# Patient Record
Sex: Female | Born: 1975
Health system: Southern US, Community
[De-identification: ages and names within clinical notes are randomized; demographics above are authoritative.]

## PROBLEM LIST (undated history)

## (undated) DIAGNOSIS — T7840XA Allergy, unspecified, initial encounter: Secondary | ICD-10-CM

## (undated) DIAGNOSIS — K219 Gastro-esophageal reflux disease without esophagitis: Secondary | ICD-10-CM

## (undated) DIAGNOSIS — Z8489 Family history of other specified conditions: Secondary | ICD-10-CM

## (undated) DIAGNOSIS — E119 Type 2 diabetes mellitus without complications: Secondary | ICD-10-CM

## (undated) DIAGNOSIS — K567 Ileus, unspecified: Secondary | ICD-10-CM

## (undated) DIAGNOSIS — I10 Essential (primary) hypertension: Secondary | ICD-10-CM

## (undated) DIAGNOSIS — Z800 Family history of malignant neoplasm of digestive organs: Secondary | ICD-10-CM

## (undated) DIAGNOSIS — G4730 Sleep apnea, unspecified: Secondary | ICD-10-CM

## (undated) DIAGNOSIS — D649 Anemia, unspecified: Secondary | ICD-10-CM

## (undated) DIAGNOSIS — J45909 Unspecified asthma, uncomplicated: Secondary | ICD-10-CM

## (undated) DIAGNOSIS — Z973 Presence of spectacles and contact lenses: Secondary | ICD-10-CM

## (undated) HISTORY — PX: ABDOMINAL HYSTERECTOMY: SHX81

## (undated) HISTORY — DX: Family history of malignant neoplasm of digestive organs: Z80.0

## (undated) HISTORY — DX: Sleep apnea, unspecified: G47.30

## (undated) HISTORY — DX: Ileus, unspecified: K56.7

## (undated) HISTORY — PX: TUBAL LIGATION: SHX77

## (undated) HISTORY — DX: Allergy, unspecified, initial encounter: T78.40XA

## (undated) HISTORY — DX: Anemia, unspecified: D64.9

---

## 2004-04-04 DIAGNOSIS — Q753 Macrocephaly: Secondary | ICD-10-CM

## 2013-09-02 ENCOUNTER — Emergency Department: Payer: Self-pay | Admitting: Emergency Medicine

## 2013-09-02 LAB — BASIC METABOLIC PANEL
Anion Gap: 4 — ABNORMAL LOW (ref 7–16)
BUN: 15 mg/dL (ref 7–18)
CHLORIDE: 108 mmol/L — AB (ref 98–107)
Calcium, Total: 8.6 mg/dL (ref 8.5–10.1)
Co2: 25 mmol/L (ref 21–32)
Creatinine: 0.79 mg/dL (ref 0.60–1.30)
EGFR (African American): >60
EGFR (Non-African Amer.): >60
Glucose: 104 mg/dL — ABNORMAL HIGH (ref 65–99)
OSMOLALITY: 275 (ref 275–301)
POTASSIUM: 4 mmol/L (ref 3.5–5.1)
Sodium: 137 mmol/L (ref 136–145)

## 2013-09-02 LAB — TROPONIN I

## 2013-09-02 LAB — CBC
HCT: 28.1 % — AB (ref 35.0–47.0)
HGB: 8.2 g/dL — ABNORMAL LOW (ref 12.0–16.0)
MCH: 18.4 pg — AB (ref 26.0–34.0)
MCHC: 29.1 g/dL — ABNORMAL LOW (ref 32.0–36.0)
MCV: 63 fL — AB (ref 80–100)
Platelet: 401 10*3/uL (ref 150–440)
RBC: 4.43 10*6/uL (ref 3.80–5.20)
RDW: 17.9 % — AB (ref 11.5–14.5)
WBC: 7.4 10*3/uL (ref 3.6–11.0)

## 2013-10-30 ENCOUNTER — Emergency Department: Payer: Self-pay | Admitting: Internal Medicine

## 2013-10-30 LAB — CBC
HCT: 30.7 % — AB (ref 35.0–47.0)
HGB: 9.3 g/dL — ABNORMAL LOW (ref 12.0–16.0)
MCH: 19.7 pg — ABNORMAL LOW (ref 26.0–34.0)
MCHC: 30.4 g/dL — ABNORMAL LOW (ref 32.0–36.0)
MCV: 65 fL — ABNORMAL LOW (ref 80–100)
Platelet: 461 10*3/uL — ABNORMAL HIGH (ref 150–440)
RBC: 4.73 10*6/uL (ref 3.80–5.20)
RDW: 20.6 % — AB (ref 11.5–14.5)
WBC: 4.4 10*3/uL (ref 3.6–11.0)

## 2013-10-30 LAB — COMPREHENSIVE METABOLIC PANEL
ALBUMIN: 3.4 g/dL (ref 3.4–5.0)
ALT: 18 U/L
AST: 21 U/L (ref 15–37)
Alkaline Phosphatase: 52 U/L
Anion Gap: 5 — ABNORMAL LOW (ref 7–16)
BILIRUBIN TOTAL: 0.2 mg/dL (ref 0.2–1.0)
BUN: 10 mg/dL (ref 7–18)
CO2: 27 mmol/L (ref 21–32)
CREATININE: 0.67 mg/dL (ref 0.60–1.30)
Calcium, Total: 8.1 mg/dL — ABNORMAL LOW (ref 8.5–10.1)
Chloride: 106 mmol/L (ref 98–107)
EGFR (African American): >60
EGFR (Non-African Amer.): >60
Glucose: 85 mg/dL (ref 65–99)
Osmolality: 274 (ref 275–301)
Potassium: 3.9 mmol/L (ref 3.5–5.1)
SODIUM: 138 mmol/L (ref 136–145)
TOTAL PROTEIN: 7.6 g/dL (ref 6.4–8.2)

## 2013-10-30 LAB — TROPONIN I: Troponin-I: <0.02 ng/mL

## 2013-10-30 LAB — LIPASE, BLOOD: LIPASE: 244 U/L (ref 73–393)

## 2013-10-30 LAB — CK TOTAL AND CKMB (NOT AT ARMC)
CK, Total: 129 U/L
CK-MB: 0.9 ng/mL (ref 0.5–3.6)

## 2013-10-30 LAB — D-DIMER(ARMC): D-Dimer: 396 ng/ml

## 2013-12-26 ENCOUNTER — Ambulatory Visit: Payer: Self-pay | Admitting: General Practice

## 2014-03-06 ENCOUNTER — Ambulatory Visit: Payer: Self-pay | Admitting: General Practice

## 2014-05-06 ENCOUNTER — Emergency Department: Payer: Self-pay | Admitting: Emergency Medicine

## 2014-05-06 LAB — CBC WITH DIFFERENTIAL/PLATELET
BASOS ABS: 0 10*3/uL (ref 0.0–0.1)
Basophil %: 0.8 %
EOS PCT: 3.8 %
Eosinophil #: 0.2 10*3/uL (ref 0.0–0.7)
HCT: 30.5 % — ABNORMAL LOW (ref 35.0–47.0)
HGB: 9.2 g/dL — ABNORMAL LOW (ref 12.0–16.0)
LYMPHS ABS: 1.9 10*3/uL (ref 1.0–3.6)
Lymphocyte %: 38.4 %
MCH: 20.4 pg — AB (ref 26.0–34.0)
MCHC: 30.1 g/dL — ABNORMAL LOW (ref 32.0–36.0)
MCV: 68 fL — ABNORMAL LOW (ref 80–100)
Monocyte #: 0.6 x10 3/mm (ref 0.2–0.9)
Monocyte %: 11.1 %
Neutrophil #: 2.3 10*3/uL (ref 1.4–6.5)
Neutrophil %: 45.9 %
Platelet: 455 10*3/uL — ABNORMAL HIGH (ref 150–440)
RBC: 4.51 10*6/uL (ref 3.80–5.20)
RDW: 18.9 % — ABNORMAL HIGH (ref 11.5–14.5)
WBC: 5 10*3/uL (ref 3.6–11.0)

## 2014-05-06 LAB — COMPREHENSIVE METABOLIC PANEL
Albumin: 3.2 g/dL — ABNORMAL LOW (ref 3.4–5.0)
Alkaline Phosphatase: 60 U/L (ref 46–116)
Anion Gap: 9 (ref 7–16)
BILIRUBIN TOTAL: 0.2 mg/dL (ref 0.2–1.0)
BUN: 13 mg/dL (ref 7–18)
CO2: 25 mmol/L (ref 21–32)
CREATININE: 0.78 mg/dL (ref 0.60–1.30)
Calcium, Total: 8.8 mg/dL (ref 8.5–10.1)
Chloride: 106 mmol/L (ref 98–107)
EGFR (African American): >60
Glucose: 107 mg/dL — ABNORMAL HIGH (ref 65–99)
OSMOLALITY: 280 (ref 275–301)
Potassium: 4 mmol/L (ref 3.5–5.1)
SGOT(AST): 13 U/L — ABNORMAL LOW (ref 15–37)
SGPT (ALT): 19 U/L (ref 14–63)
SODIUM: 140 mmol/L (ref 136–145)
Total Protein: 7.1 g/dL (ref 6.4–8.2)

## 2014-05-06 LAB — URINALYSIS, COMPLETE
Bacteria: NONE SEEN
Bilirubin,UR: NEGATIVE
Blood: NEGATIVE
Glucose,UR: NEGATIVE mg/dL (ref 0–75)
Ketone: NEGATIVE
LEUKOCYTE ESTERASE: NEGATIVE
NITRITE: NEGATIVE
Ph: 6 (ref 4.5–8.0)
Protein: NEGATIVE
RBC, UR: NONE SEEN /HPF (ref 0–5)
Specific Gravity: 1.016 (ref 1.003–1.030)

## 2014-05-06 LAB — GC/CHLAMYDIA PROBE AMP

## 2014-05-06 LAB — WET PREP, GENITAL

## 2014-05-06 LAB — LIPASE, BLOOD: Lipase: 263 U/L (ref 73–393)

## 2015-02-08 ENCOUNTER — Emergency Department: Payer: BLUE CROSS/BLUE SHIELD

## 2015-02-08 ENCOUNTER — Emergency Department
Admission: EM | Admit: 2015-02-08 | Discharge: 2015-02-08 | Disposition: A | Discharge status: Home / Self Care | Payer: BLUE CROSS/BLUE SHIELD | Attending: Emergency Medicine | Admitting: Emergency Medicine

## 2015-02-08 ENCOUNTER — Encounter: Payer: Self-pay | Admitting: Emergency Medicine

## 2015-02-08 DIAGNOSIS — I10 Essential (primary) hypertension: Secondary | ICD-10-CM | POA: Diagnosis not present

## 2015-02-08 DIAGNOSIS — R002 Palpitations: Secondary | ICD-10-CM | POA: Diagnosis not present

## 2015-02-08 DIAGNOSIS — E119 Type 2 diabetes mellitus without complications: Secondary | ICD-10-CM | POA: Insufficient documentation

## 2015-02-08 DIAGNOSIS — R079 Chest pain, unspecified: Secondary | ICD-10-CM | POA: Diagnosis present

## 2015-02-08 HISTORY — DX: Type 2 diabetes mellitus without complications: E11.9

## 2015-02-08 HISTORY — DX: Essential (primary) hypertension: I10

## 2015-02-08 HISTORY — DX: Unspecified asthma, uncomplicated: J45.909

## 2015-02-08 LAB — CBC
HEMATOCRIT: 27.7 % — AB (ref 35.0–47.0)
HEMOGLOBIN: 8.4 g/dL — AB (ref 12.0–16.0)
MCH: 19.5 pg — ABNORMAL LOW (ref 26.0–34.0)
MCHC: 30.5 g/dL — ABNORMAL LOW (ref 32.0–36.0)
MCV: 63.9 fL — AB (ref 80.0–100.0)
Platelets: 456 10*3/uL — ABNORMAL HIGH (ref 150–440)
RBC: 4.33 MIL/uL (ref 3.80–5.20)
RDW: 17.3 % — AB (ref 11.5–14.5)
WBC: 4.4 10*3/uL (ref 3.6–11.0)

## 2015-02-08 LAB — BASIC METABOLIC PANEL
ANION GAP: 4 — AB (ref 5–15)
BUN: 12 mg/dL (ref 6–20)
CALCIUM: 8.8 mg/dL — AB (ref 8.9–10.3)
CO2: 26 mmol/L (ref 22–32)
Chloride: 105 mmol/L (ref 101–111)
Creatinine, Ser: 0.66 mg/dL (ref 0.44–1.00)
GFR calc non Af Amer: >60 mL/min (ref >60)
GLUCOSE: 98 mg/dL (ref 65–99)
POTASSIUM: 4.5 mmol/L (ref 3.5–5.1)
Sodium: 135 mmol/L (ref 135–145)

## 2015-02-08 LAB — TROPONIN I

## 2015-02-08 MED ORDER — ASPIRIN 81 MG PO CHEW
324.0000 mg | CHEWABLE_TABLET | Freq: Once | ORAL | Status: AC
  Administered 2015-02-08: 324 mg via ORAL
  Filled 2015-02-08: qty 4

## 2015-02-08 NOTE — ED Notes (Signed)
States she developed chest pain on Thursday  States pain eases off with lying down  Also having some SOB and feels like she is having some palpations

## 2015-02-08 NOTE — Discharge Instructions (Signed)

## 2015-02-08 NOTE — ED Provider Notes (Signed)
[facility] Emergency Department Provider Note REMINDER - THIS NOTE IS NOT A FINAL MEDICAL RECORD UNTIL IT IS SIGNED. UNTIL THEN, THE CONTENT BELOW MAY REFLECT INFORMATION FROM A DOCUMENTATION TEMPLATE, NOT THE ACTUAL PATIENT VISIT. ____________________________________________  Time seen: Approximately 7:35 AM  I have reviewed the triage vital signs and the nursing notes.   HISTORY  Chief Complaint Chest Pain    HPI Donna Reyes is a 39 y.o. female with no significant medical history except for diabetes, asthma, and hypertension  She notes since about Thursday she's had episodes of palpitations that will last for approximately 45 seconds where she feels slightly breathless. 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  She denies any chest pain. She feels briefly breathless during episodes of palpitations, but presently does not have any. She is not having sharp pains with deep inspiration. She denies any wheezing.  She otherwise feels well, and at present time is asymptomatic.   Past Medical History  Diagnosis Date  . Asthma   . Diabetes mellitus without complication ([place])   . Hypertension     There are no active problems to display for this patient.   No past surgical history on file.  No current outpatient prescriptions on file.  Allergies Review of patient's allergies indicates no known allergies.  No family history on file.  Social History Social History  Substance Use Topics  . Smoking status: Never Smoker   . Smokeless tobacco: None  . Alcohol Use: No    Review of Systems Constitutional: No fever/chills Eyes: No visual changes. ENT: No sore throat. Cardiovascular: Denies chest pain. Respiratory: Denies shortness of breath except feels slightly short of  breath during episodes of palpitations. Gastrointestinal: No abdominal pain.  No nausea, no vomiting.  No diarrhea.  No constipation. Genitourinary: Negative for dysuria. Musculoskeletal: Negative for back pain. Skin: Negative for rash. Neurological: Negative for headaches, focal weakness or numbness.  Patient reports that when she was pregnant about 15 years ago she did have swelling in her legs, but has never had a history of any blood clots.  10-point ROS otherwise negative.  ____________________________________________   PHYSICAL EXAM:  VITAL SIGNS: ED Triage Vitals  Enc Vitals Group     BP --      Pulse --      Resp --      Temp --      Temp src --      SpO2 --      Weight 02/08/15 0721 184 lb (83.462 kg)     Height 02/08/15 0721 5\' 2"  (1.575 m)     Head Cir --      Peak Flow --      Pain Score 02/08/15 0721 3     Pain Loc --      Pain Edu? --      Excl. in [place]? --    Constitutional: Alert and oriented. Well appearing and in no acute distress. Eyes: Conjunctivae are normal. PERRL. EOMI. Head: Atraumatic. Nose: No congestion/rhinnorhea. Mouth/Throat: Mucous membranes are moist.  Oropharynx non-erythematous. Neck: No stridor.   Cardiovascular: Normal rate, regular rhythm. Grossly normal heart sounds.  Good peripheral circulation. Respiratory: Normal respiratory effort.  No retractions. Lungs CTAB. Gastrointestinal: Soft and nontender. No distention. No abdominal bruits. No CVA tenderness.  Musculoskeletal: No lower extremity tenderness nor edema.  No unilateral leg swelling. No thigh tenderness or cords. No joint effusions. Neurologic:  Normal speech and language. No gross focal neurologic deficits are appreciated. No gait instability. Skin:  Skin is warm, dry and intact. No rash noted. Psychiatric: Mood and affect are normal. Speech and behavior are normal.  ____________________________________________   LABS (all labs ordered are listed, but only abnormal results  are displayed)  Labs Reviewed  CBC - Abnormal; Notable for the following:    Hemoglobin 8.4 (*)    HCT 27.7 (*)    MCV 63.9 (*)    MCH 19.5 (*)    MCHC 30.5 (*)    RDW 17.3 (*)    Platelets 456 (*)    All other components within normal limits  BASIC METABOLIC PANEL - Abnormal; Notable for the following:    Calcium 8.8 (*)    Anion gap 4 (*)    All other components within normal limits  TROPONIN I   ____________________________________________  EKG  ED ECG REPORT I, Reynald Woods, the attending physician, personally viewed and interpreted this ECG.  Date: 02/08/2015 EKG Time: 723am Rate: 75 Rhythm: normal sinus rhythm QRS Axis: normal Intervals: normal ST/T Wave abnormalities: normal Conduction Disutrbances: none Narrative Interpretation: unremarkable  ____________________________________________  RADIOLOGY  DG Chest 2 View (Final result) Result time: 02/08/15 08:15:38   Final result by Rad Results In Interface (02/08/15 08:15:38)   Narrative:   CLINICAL DATA: Chest pain. History of asthma, hypertension and diabetes.  EXAM: CHEST 2 VIEW  COMPARISON: 09/02/2013; chest CTA 10/30/2013  FINDINGS: Grossly unchanged cardiac silhouette and mediastinal contours. The lungs remain hyperexpanded. No focal airspace opacities. No pleural effusion or pneumothorax. No evidence of edema. No acute osseus abnormalities.  IMPRESSION: Similar findings of lung hyper expansion without acute cardiopulmonary disease.    ____________________________________________   PROCEDURES  Procedure(s) performed: None  Critical Care performed: No  ____________________________________________   INITIAL IMPRESSION / ASSESSMENT AND PLAN / ED COURSE  Pertinent labs & imaging results that were available during my care of the patient were reviewed by me and considered in my medical decision making (see chart for details).      Pulmonary Embolism Rule-out Criteria (PERC  rule)                        If YES to ANY of the following, the PERC rule is not satisfied and cannot be used to rule out PE in this patient (consider d-dimer or imaging depending on pre-test probability).                      If NO to ALL of the following, AND the clinician's pre-test probability is <15%, the [facility] rule is satisfied and there is no need for further workup (including no need to obtain a d-dimer) as the post-test probability of pulmonary embolism is <2%.                      Mnemonic is HAD CLOTS   H - hormone use (exogenous estrogen)      No. A - age > 27                                                 No. D - DVT/PE  history                                      No.   C - coughing blood (hemoptysis)                 No. L - leg swelling, unilateral                             No. O - O2 Sat on Room Air < 95%                  No. T - tachycardia (HR ? 100)                         No. S - surgery or trauma, recent                      No.   Based on my evaluation of the patient, including application of this decision instrument, further testing to evaluate for pulmonary embolism is not indicated at this time. I have discussed this recommendation with the patient who states understanding and agreement with this plan.  Patient presents for evaluation of palpitations. She is awake and alert in no distress. Her EKG is normal, she has no evidence of distress, wheezing, or significant risk factors for pulmonary and wasn't. Her symptoms seem to be primarily around episode of palpitations, and she does reports she's had this previously with a workup by cardiology Dr. Chancy Milroy did not demonstrate a clear cause about 1-2 years ago. At the present time, she has no concerning symptoms such as syncope or extreme weakness. We will obtain a single troponin and she is a diabetic female, and do wish to exclude ischemia however my primary thought is that this is likely palpitations. She has no acute  respiratory symptoms aside from some mild dyspnea occurs during episodes of palpitations or hospital 45 seconds. No infectious symptoms, denies any symptoms of "asthma".  I discussed with the patient that if her testing in the ER does not show a concerning cause that I would advise her to follow up with Dr. Chancy Milroy via phone call and setting up an appointment tomorrow. She is very agreeable, and we did discuss careful return precautions including if she develops "chest pain", extreme weakness, palpitations do not go away, nausea, vomiting, or other new concerns arise.  ----------------------------------------- 9:01 AM on 02/08/2015 ----------------------------------------- Labs to indicate anemia, appears to have microcytic anemia with the previous history of baseline hemoglobin approximately 9. Patient is aware of this, and has been under physician care regarding ongoing anemia.  No episodes of ectopy on telemetry. Patient awake alert no distress. We'll discharge patient home, following up with Dr. Chancy Milroy of cardiology. Return precautions advised. ____________________________________________   FINAL CLINICAL IMPRESSION(S) / ED DIAGNOSES  Final diagnoses:  Heart palpitations      Delman Kitten, MD 02/08/15 [phone]

## 2015-08-08 ENCOUNTER — Emergency Department
Admission: EM | Admit: 2015-08-08 | Discharge: 2015-08-08 | Disposition: A | Discharge status: Home / Self Care | Payer: BLUE CROSS/BLUE SHIELD | Attending: Emergency Medicine | Admitting: Emergency Medicine

## 2015-08-08 ENCOUNTER — Encounter: Payer: Self-pay | Admitting: Emergency Medicine

## 2015-08-08 DIAGNOSIS — R1031 Right lower quadrant pain: Secondary | ICD-10-CM | POA: Insufficient documentation

## 2015-08-08 DIAGNOSIS — Z5321 Procedure and treatment not carried out due to patient leaving prior to being seen by health care provider: Secondary | ICD-10-CM | POA: Diagnosis not present

## 2015-08-08 LAB — CBC
HEMATOCRIT: 26.5 % — AB (ref 35.0–47.0)
HEMOGLOBIN: 7.9 g/dL — AB (ref 12.0–16.0)
MCH: 17.9 pg — AB (ref 26.0–34.0)
MCHC: 29.9 g/dL — AB (ref 32.0–36.0)
MCV: 59.7 fL — AB (ref 80.0–100.0)
Platelets: 494 10*3/uL — ABNORMAL HIGH (ref 150–440)
RBC: 4.45 MIL/uL (ref 3.80–5.20)
RDW: 18.3 % — AB (ref 11.5–14.5)
WBC: 6 10*3/uL (ref 3.6–11.0)

## 2015-08-08 LAB — COMPREHENSIVE METABOLIC PANEL
ALBUMIN: 3.9 g/dL (ref 3.5–5.0)
ALT: 10 U/L — ABNORMAL LOW (ref 14–54)
ANION GAP: 7 (ref 5–15)
AST: 14 U/L — AB (ref 15–41)
Alkaline Phosphatase: 49 U/L (ref 38–126)
BILIRUBIN TOTAL: 0.3 mg/dL (ref 0.3–1.2)
BUN: 14 mg/dL (ref 6–20)
CHLORIDE: 106 mmol/L (ref 101–111)
CO2: 23 mmol/L (ref 22–32)
Calcium: 8.9 mg/dL (ref 8.9–10.3)
Creatinine, Ser: 0.69 mg/dL (ref 0.44–1.00)
GFR calc Af Amer: >60 mL/min (ref >60)
GFR calc non Af Amer: >60 mL/min (ref >60)
GLUCOSE: 118 mg/dL — AB (ref 65–99)
POTASSIUM: 3.9 mmol/L (ref 3.5–5.1)
SODIUM: 136 mmol/L (ref 135–145)
TOTAL PROTEIN: 7.3 g/dL (ref 6.5–8.1)

## 2015-08-08 LAB — URINALYSIS COMPLETE WITH MICROSCOPIC (ARMC ONLY)
BACTERIA UA: NONE SEEN
Bilirubin Urine: NEGATIVE
Glucose, UA: NEGATIVE mg/dL
Hgb urine dipstick: NEGATIVE
KETONES UR: NEGATIVE mg/dL
Leukocytes, UA: NEGATIVE
Nitrite: NEGATIVE
PH: 8 (ref 5.0–8.0)
PROTEIN: NEGATIVE mg/dL
Specific Gravity, Urine: 1.014 (ref 1.005–1.030)

## 2015-08-08 LAB — LIPASE, BLOOD: Lipase: 48 U/L (ref 11–51)

## 2015-08-08 LAB — POCT PREGNANCY, URINE: PREG TEST UR: NEGATIVE

## 2015-08-08 NOTE — ED Notes (Signed)
Patient states that she has had right lower abd pain times four days. Patient reports that the pain became worse and woke her out of her sleep this morning. Patient repots nausea but no vomiting.

## 2015-08-10 ENCOUNTER — Telehealth: Payer: Self-pay | Admitting: Emergency Medicine

## 2015-08-10 NOTE — ED Notes (Signed)
Called patient due to lwot to inquire about condition and follow up plans. Pt says she is feeling better.  She says she does  Have a pcp.  i advised her to have pcp review the labs we did here.  She agrees.

## 2015-09-16 DIAGNOSIS — G4733 Obstructive sleep apnea (adult) (pediatric): Secondary | ICD-10-CM | POA: Diagnosis not present

## 2015-11-13 DIAGNOSIS — H5213 Myopia, bilateral: Secondary | ICD-10-CM | POA: Diagnosis not present

## 2015-11-13 DIAGNOSIS — Z0100 Encounter for examination of eyes and vision without abnormal findings: Secondary | ICD-10-CM | POA: Diagnosis not present

## 2015-12-17 DIAGNOSIS — G4733 Obstructive sleep apnea (adult) (pediatric): Secondary | ICD-10-CM | POA: Diagnosis not present

## 2015-12-30 DIAGNOSIS — D509 Iron deficiency anemia, unspecified: Secondary | ICD-10-CM | POA: Insufficient documentation

## 2015-12-30 NOTE — Progress Notes (Signed)
[facility]  Telephone:([phone] [phone]  ID: Donna Reyes OB: 18-Apr-1975  MR#: [id number]  [id number]  No care team member to display  CHIEF COMPLAINT: Iron deficiency anemia  INTERVAL HISTORY: Patient is a 40 year old female who was found to have significant iron deficiency anemia on routine blood work. She currently feels weak and fatigued, but otherwise feels well. She reports heavy menses, but no other bleeding. She has no neurologic complaints. She denies any recent fevers or illnesses. She has a good appetite and denies weight loss. She denies any chest pain or shortness of breath. She denies any nausea, vomiting, constipation, or diarrhea. She has no melena or hematochezia. She has no urinary complaints. Patient otherwise feels well and offers no further specific complaints.   REVIEW OF SYSTEMS:   Review of Systems  Constitutional: Positive for malaise/fatigue. Negative for fever and weight loss.  Respiratory: Negative.  Negative for cough and shortness of breath.   Cardiovascular: Negative.  Negative for chest pain and leg swelling.  Gastrointestinal: Negative.  Negative for abdominal pain, blood in stool and melena.  Genitourinary: Negative.   Musculoskeletal: Negative.   Neurological: Positive for weakness.  Psychiatric/Behavioral: Negative.  The patient is not nervous/anxious.     As per HPI. Otherwise, a complete review of systems is negative.  PAST MEDICAL HISTORY: Past Medical History:  Diagnosis Date  . Allergy   . Asthma   . Diabetes mellitus without complication ([place])   . Hypertension     PAST SURGICAL HISTORY: Past Surgical History:  Procedure Laterality Date  . CESAREAN SECTION     times 3    FAMILY HISTORY: Reviewed and unchanged. No reported history of malignancy or chronic disease.  ADVANCED DIRECTIVES (Y/N):  N  HEALTH MAINTENANCE: Social History  Substance Use Topics  . Smoking status: Never  Smoker  . Smokeless tobacco: Never Used  . Alcohol use No     Colonoscopy:  PAP:  Bone density:  Lipid panel:  Allergies  Allergen Reactions  . Lisinopril     Current Outpatient Prescriptions  Medication Sig Dispense Refill  . amLODipine (NORVASC) 5 MG tablet 5 mg.  10   No current facility-administered medications for this visit.     OBJECTIVE: Vitals:   01/01/16 0845  BP: 121/82  Pulse: 74  Temp: 97.2 F (36.2 C)     Body mass index is 33.23 kg/m.    ECOG FS:0 - Asymptomatic  General: Well-developed, well-nourished, no acute distress. Eyes: Pink conjunctiva, anicteric sclera. HEENT: Normocephalic, moist mucous membranes, clear oropharnyx. Lungs: Clear to auscultation bilaterally. Heart: Regular rate and rhythm. No rubs, murmurs, or gallops. Abdomen: Soft, nontender, nondistended. No organomegaly noted, normoactive bowel sounds. Musculoskeletal: No edema, cyanosis, or clubbing. Neuro: Alert, answering all questions appropriately. Cranial nerves grossly intact. Skin: No rashes or petechiae noted. Psych: Normal affect. Lymphatics: No cervical, calvicular, axillary or inguinal LAD.   LAB RESULTS:  Lab Results  Component Value Date   NA 136 08/08/2015   K 3.9 08/08/2015   CL 106 08/08/2015   CO2 23 08/08/2015   GLUCOSE 118 (H) 08/08/2015   BUN 14 08/08/2015   CREATININE 0.69 08/08/2015   CALCIUM 8.9 08/08/2015   PROT 7.3 08/08/2015   ALBUMIN 3.9 08/08/2015   AST 14 (L) 08/08/2015   ALT 10 (L) 08/08/2015   ALKPHOS 49 08/08/2015   BILITOT 0.3 08/08/2015   GFRNONAA >60 08/08/2015   GFRAA >60 08/08/2015    Lab Results  Component Value Date  WBC 4.4 01/01/2016   NEUTROABS 2.3 05/06/2014   HGB 8.0 (L) 01/01/2016   HCT 27.4 (L) 01/01/2016   MCV 60.3 (L) 01/01/2016   PLT 391 01/01/2016   Lab Results  Component Value Date   IRON 11 (L) 01/01/2016   TIBC 499 (H) 01/01/2016   IRONPCTSAT 2 (L) 01/01/2016   Lab Results  Component Value Date    FERRITIN 4 (L) 01/01/2016     STUDIES: No results found.  ASSESSMENT: Iron deficiency anemia  PLAN:    1. Iron deficiency anemia:Likely secondary to patient's heavy menses. Patient reports she received IV iron in [place] several years ago, but it "did not work". She also has tried oral iron supplementation, but this gives her an upset stomach. Patient's hemoglobin and iron stores are significantly decreased and she will benefit from IV iron. The remainder of her laboratory work today including hemoglobin electrophoresis is pending at time of dictation. Return to clinic in 1 and 2 weeks to receive 510 mg IV Feraheme. Patient will then return to clinic in 2 months for repeat laboratory work and further evaluation. 2. Thrombocytosis: Patient's platelet count is within normal limits today.   Patient expressed understanding and was in agreement with this plan. She also understands that She can call clinic at any time with any questions, concerns, or complaints.    Lloyd Huger, MD   01/01/2016 1:49 PM

## 2016-01-01 ENCOUNTER — Encounter: Payer: Self-pay | Admitting: Oncology

## 2016-01-01 ENCOUNTER — Inpatient Hospital Stay: Payer: BLUE CROSS/BLUE SHIELD | Attending: Oncology | Admitting: Oncology

## 2016-01-01 ENCOUNTER — Inpatient Hospital Stay: Payer: BLUE CROSS/BLUE SHIELD

## 2016-01-01 VITALS — BP 121/82 | HR 74 | Temp 97.2°F | Ht 62.0 in | Wt 181.7 lb

## 2016-01-01 DIAGNOSIS — I10 Essential (primary) hypertension: Secondary | ICD-10-CM | POA: Insufficient documentation

## 2016-01-01 DIAGNOSIS — J45909 Unspecified asthma, uncomplicated: Secondary | ICD-10-CM | POA: Insufficient documentation

## 2016-01-01 DIAGNOSIS — D509 Iron deficiency anemia, unspecified: Secondary | ICD-10-CM | POA: Diagnosis not present

## 2016-01-01 DIAGNOSIS — N920 Excessive and frequent menstruation with regular cycle: Secondary | ICD-10-CM | POA: Diagnosis not present

## 2016-01-01 DIAGNOSIS — E119 Type 2 diabetes mellitus without complications: Secondary | ICD-10-CM | POA: Insufficient documentation

## 2016-01-01 DIAGNOSIS — R5383 Other fatigue: Secondary | ICD-10-CM | POA: Diagnosis not present

## 2016-01-01 DIAGNOSIS — R531 Weakness: Secondary | ICD-10-CM | POA: Insufficient documentation

## 2016-01-01 LAB — FERRITIN: Ferritin: 4 ng/mL — ABNORMAL LOW (ref 11–307)

## 2016-01-01 LAB — LACTATE DEHYDROGENASE: LDH: 155 U/L (ref 98–192)

## 2016-01-01 LAB — CBC
HEMATOCRIT: 27.4 % — AB (ref 35.0–47.0)
Hemoglobin: 8 g/dL — ABNORMAL LOW (ref 12.0–16.0)
MCH: 17.6 pg — ABNORMAL LOW (ref 26.0–34.0)
MCHC: 29.2 g/dL — ABNORMAL LOW (ref 32.0–36.0)
MCV: 60.3 fL — ABNORMAL LOW (ref 80.0–100.0)
PLATELETS: 391 10*3/uL (ref 150–440)
RBC: 4.54 MIL/uL (ref 3.80–5.20)
RDW: 18.7 % — ABNORMAL HIGH (ref 11.5–14.5)
WBC: 4.4 10*3/uL (ref 3.6–11.0)

## 2016-01-01 LAB — DAT, POLYSPECIFIC AHG (ARMC ONLY): Polyspecific AHG test: NEGATIVE

## 2016-01-01 LAB — IRON AND TIBC
Iron: 11 ug/dL — ABNORMAL LOW (ref 28–170)
SATURATION RATIOS: 2 % — AB (ref 10.4–31.8)
TIBC: 499 ug/dL — AB (ref 250–450)
UIBC: 488 ug/dL

## 2016-01-01 LAB — VITAMIN B12: VITAMIN B 12: 695 pg/mL (ref 180–914)

## 2016-01-01 LAB — FOLATE: Folate: 17.7 ng/mL (ref >5.9)

## 2016-01-02 LAB — ANA W/REFLEX: Anti Nuclear Antibody(ANA): NEGATIVE

## 2016-01-02 LAB — HAPTOGLOBIN: Haptoglobin: 69 mg/dL (ref 34–200)

## 2016-01-04 LAB — HEMOGLOBINOPATHY EVALUATION
HGB F QUANT: 0 % (ref 0.0–2.0)
HGB S QUANTITAION: 0 %
Hgb A2 Quant: 1.5 % (ref 0.7–3.1)
Hgb A: 98.5 % — ABNORMAL HIGH (ref 94.0–98.0)
Hgb C: 0 %

## 2016-01-05 DIAGNOSIS — R079 Chest pain, unspecified: Secondary | ICD-10-CM | POA: Insufficient documentation

## 2016-01-05 DIAGNOSIS — J45909 Unspecified asthma, uncomplicated: Secondary | ICD-10-CM | POA: Insufficient documentation

## 2016-01-05 DIAGNOSIS — I10 Essential (primary) hypertension: Secondary | ICD-10-CM | POA: Diagnosis not present

## 2016-01-05 DIAGNOSIS — R0602 Shortness of breath: Secondary | ICD-10-CM | POA: Diagnosis not present

## 2016-01-05 DIAGNOSIS — Z79899 Other long term (current) drug therapy: Secondary | ICD-10-CM | POA: Diagnosis not present

## 2016-01-05 DIAGNOSIS — E119 Type 2 diabetes mellitus without complications: Secondary | ICD-10-CM | POA: Diagnosis not present

## 2016-01-06 ENCOUNTER — Emergency Department
Admission: EM | Admit: 2016-01-06 | Discharge: 2016-01-06 | Disposition: A | Discharge status: Home / Self Care | Payer: BLUE CROSS/BLUE SHIELD | Attending: Emergency Medicine | Admitting: Emergency Medicine

## 2016-01-06 ENCOUNTER — Other Ambulatory Visit: Payer: Self-pay

## 2016-01-06 ENCOUNTER — Encounter: Payer: Self-pay | Admitting: Emergency Medicine

## 2016-01-06 ENCOUNTER — Emergency Department: Payer: BLUE CROSS/BLUE SHIELD

## 2016-01-06 DIAGNOSIS — J4521 Mild intermittent asthma with (acute) exacerbation: Secondary | ICD-10-CM | POA: Insufficient documentation

## 2016-01-06 DIAGNOSIS — J45909 Unspecified asthma, uncomplicated: Secondary | ICD-10-CM | POA: Insufficient documentation

## 2016-01-06 DIAGNOSIS — R079 Chest pain, unspecified: Secondary | ICD-10-CM | POA: Insufficient documentation

## 2016-01-06 DIAGNOSIS — I208 Other forms of angina pectoris: Secondary | ICD-10-CM | POA: Diagnosis not present

## 2016-01-06 DIAGNOSIS — I10 Essential (primary) hypertension: Secondary | ICD-10-CM | POA: Diagnosis not present

## 2016-01-06 DIAGNOSIS — R0602 Shortness of breath: Secondary | ICD-10-CM | POA: Diagnosis not present

## 2016-01-06 LAB — BASIC METABOLIC PANEL
Anion gap: 5 (ref 5–15)
BUN: 13 mg/dL (ref 6–20)
CALCIUM: 9.5 mg/dL (ref 8.9–10.3)
CO2: 24 mmol/L (ref 22–32)
CREATININE: 0.63 mg/dL (ref 0.44–1.00)
Chloride: 107 mmol/L (ref 101–111)
GLUCOSE: 117 mg/dL — AB (ref 65–99)
Potassium: 3.9 mmol/L (ref 3.5–5.1)
Sodium: 136 mmol/L (ref 135–145)

## 2016-01-06 LAB — CBC
HEMATOCRIT: 28.2 % — AB (ref 35.0–47.0)
HEMOGLOBIN: 8.7 g/dL — AB (ref 12.0–16.0)
MCH: 18.1 pg — ABNORMAL LOW (ref 26.0–34.0)
MCHC: 30.7 g/dL — AB (ref 32.0–36.0)
MCV: 59 fL — AB (ref 80.0–100.0)
Platelets: 397 10*3/uL (ref 150–440)
RBC: 4.78 MIL/uL (ref 3.80–5.20)
RDW: 19 % — AB (ref 11.5–14.5)
WBC: 7.1 10*3/uL (ref 3.6–11.0)

## 2016-01-06 LAB — FIBRIN DERIVATIVES D-DIMER (ARMC ONLY): Fibrin derivatives D-dimer (ARMC): 276 (ref 0–499)

## 2016-01-06 LAB — TROPONIN I: Troponin I: <0.03 ng/mL (ref <0.03)

## 2016-01-06 MED ORDER — ASPIRIN 81 MG PO CHEW
324.0000 mg | CHEWABLE_TABLET | Freq: Once | ORAL | Status: AC
  Administered 2016-01-06: 324 mg via ORAL
  Filled 2016-01-06: qty 4

## 2016-01-06 NOTE — ED Provider Notes (Signed)
[facility] Emergency Department Provider Note    First MD Initiated Contact with Patient 01/06/16 0222     (approximate)  I have reviewed the triage vital signs and the nursing notes.   HISTORY  Chief Complaint Chest Pain    HPI Donna Reyes is a 40 y.o. female with history of hypertension diabetes and asthma presents to the emergency department with mid chest pain with radiation to her upper back accompanied by dyspnea since 10 AM yesterday morning. Patient states that the pain is worse with deep inspiration. Patient denies any lower extremity pain or swelling. Patient denies any personal history of cardiac disease.    Past Medical History:  Diagnosis Date  . Allergy   . Asthma   . Diabetes mellitus without complication ([place])   . Hypertension     Patient Active Problem List   Diagnosis Date Noted  . Iron deficiency anemia 12/30/2015    Past Surgical History:  Procedure Laterality Date  . CESAREAN SECTION     times 3    Prior to Admission medications   Medication Sig Start Date End Date Taking? Authorizing Provider  amLODipine (NORVASC) 5 MG tablet 5 mg. 10/08/15   Historical Provider, MD    Allergies Lisinopril  Family History  Problem Relation Age of Onset  . Diabetes Mother   . Diabetes Maternal Grandmother   . Cancer Maternal Grandmother     Social History Social History  Substance Use Topics  . Smoking status: Never Smoker  . Smokeless tobacco: Never Used  . Alcohol use No    Review of Systems Constitutional: No fever/chills Eyes: No visual changes. ENT: No sore throat. Cardiovascular: Positive for chest pain. Respiratory: Positive for shortness of breath. Gastrointestinal: No abdominal pain.  No nausea, no vomiting.  No diarrhea.  No constipation. Genitourinary: Negative for dysuria. Musculoskeletal: Negative for back pain. Skin: Negative for rash. Neurological: Negative for headaches, focal weakness or  numbness.  10-point ROS otherwise negative.  ____________________________________________   PHYSICAL EXAM:  VITAL SIGNS: ED Triage Vitals  Enc Vitals Group     BP 01/06/16 0006 137/85     Pulse Rate 01/06/16 0006 77     Resp 01/06/16 0006 18     Temp 01/06/16 0006 98.2 F (36.8 C)     Temp Source 01/06/16 0006 Oral     SpO2 01/06/16 0006 100 %     Weight 01/06/16 0005 181 lb (82.1 kg)     Height 01/06/16 0005 5\' 2"  (1.575 m)     Head Circumference --      Peak Flow --      Pain Score 01/06/16 0005 6     Pain Loc --      Pain Edu? --      Excl. in [place]? --     Constitutional: Alert and oriented. Well appearing and in no acute distress. Eyes: Conjunctivae are normal. PERRL. EOMI. Head: Atraumatic. Ears:  Healthy appearing ear canals and TMs bilaterally Nose: No congestion/rhinnorhea. Mouth/Throat: Mucous membranes are moist.  Oropharynx non-erythematous. Neck: No stridor.  No meningeal signs.  No cervical spine tenderness to palpation. Cardiovascular: Normal rate, regular rhythm. Good peripheral circulation. Grossly normal heart sounds. Respiratory: Normal respiratory effort.  No retractions. Lungs CTAB. Gastrointestinal: Soft and nontender. No distention.  Musculoskeletal: No lower extremity tenderness nor edema. No gross deformities of extremities. Neurologic:  Normal speech and language. No gross focal neurologic deficits are appreciated.  Skin:  Skin is warm, dry and  intact. No rash noted. Psychiatric: Mood and affect are normal. Speech and behavior are normal.  ____________________________________________   LABS (all labs ordered are listed, but only abnormal results are displayed)  Labs Reviewed  BASIC METABOLIC PANEL - Abnormal; Notable for the following:       Result Value   Glucose, Bld 117 (*)    All other components within normal limits  CBC - Abnormal; Notable for the following:    Hemoglobin 8.7 (*)    HCT 28.2 (*)    MCV 59.0 (*)    MCH 18.1 (*)     MCHC 30.7 (*)    RDW 19.0 (*)    All other components within normal limits  TROPONIN I  FIBRIN DERIVATIVES D-DIMER ([facility] ONLY)  TROPONIN I   ____________________________________________  EKG  ED ECG REPORT I, [place] N Elaynah [place], the attending physician, personally viewed and interpreted this ECG.   Date: 01/06/2016  EKG Time: 12:09 AM  Rate: 75  Rhythm: Normal sinus rhythm  Axis: Normal  Intervals:Normal  ST&T Change: None  ____________________________________________  RADIOLOGY I, [place] N Caine [place], personally viewed and evaluated these images (plain radiographs) as part of my medical decision making, as well as reviewing the written report by the radiologist.  Dg Chest 2 View  Result Date: 01/06/2016 CLINICAL DATA:  Chest pain and shortness of breath beginning yesterday morning. History of asthma and diabetes. EXAM: CHEST  2 VIEW COMPARISON:  Chest radiograph February 08, 2015 FINDINGS: The heart size and mediastinal contours are within normal limits. Mild bronchitic changes. Both lungs are clear. The visualized skeletal structures are unremarkable. IMPRESSION: Mild bronchitic changes without focal consolidation. Electronically Signed   By: Elon Alas M.D.   On: 01/06/2016 02:28     Procedures     INITIAL IMPRESSION / ASSESSMENT AND PLAN / ED COURSE  Pertinent labs & imaging results that were available during my care of the patient were reviewed by me and considered in my medical decision making (see chart for details).  Patient with reproducible chest chest pain since 10 AM yesterday morning accompanied by dyspnea. Consider potential myocardial ischemia versus infarction however EKG revealed no evidence of either as well as troponin negative 2. Consider potential possibility of pulmonary emboli and low risk patient however d-dimer negative. We'll refer the patient to cardiology for further outpatient evaluation.   Clinical Course     ____________________________________________  FINAL CLINICAL IMPRESSION(S) / ED DIAGNOSES  Final diagnoses:  Nonspecific chest pain     MEDICATIONS GIVEN DURING THIS VISIT:  Medications  aspirin chewable tablet 324 mg (324 mg Oral Given 01/06/16 0238)     NEW OUTPATIENT MEDICATIONS STARTED DURING THIS VISIT:  New Prescriptions   No medications on file    Modified Medications   No medications on file    Discontinued Medications   No medications on file     Note:  This document was prepared using Dragon voice recognition software and may include unintentional dictation errors.    Gregor Hams, MD 01/06/16 [phone]

## 2016-01-06 NOTE — ED Triage Notes (Signed)
Patient ambulatory to triage with steady gait, without difficulty or distress noted; pt reports mid CP radiating into back accomp by [facility] since 10am; denies hx of same

## 2016-01-08 ENCOUNTER — Inpatient Hospital Stay: Payer: BLUE CROSS/BLUE SHIELD | Attending: Oncology

## 2016-01-08 VITALS — BP 128/83 | HR 78

## 2016-01-08 DIAGNOSIS — D509 Iron deficiency anemia, unspecified: Secondary | ICD-10-CM | POA: Insufficient documentation

## 2016-01-08 DIAGNOSIS — D508 Other iron deficiency anemias: Secondary | ICD-10-CM

## 2016-01-08 DIAGNOSIS — Z79899 Other long term (current) drug therapy: Secondary | ICD-10-CM | POA: Diagnosis not present

## 2016-01-08 MED ORDER — SODIUM CHLORIDE 0.9 % IV SOLN
Freq: Once | INTRAVENOUS | Status: AC
  Administered 2016-01-08: 10:00:00 via INTRAVENOUS
  Filled 2016-01-08: qty 1000

## 2016-01-08 MED ORDER — SODIUM CHLORIDE 0.9 % IV SOLN
510.0000 mg | Freq: Once | INTRAVENOUS | Status: AC
  Administered 2016-01-08: 510 mg via INTRAVENOUS
  Filled 2016-01-08: qty 17

## 2016-01-15 ENCOUNTER — Inpatient Hospital Stay: Payer: BLUE CROSS/BLUE SHIELD

## 2016-01-15 VITALS — BP 122/84 | HR 74 | Temp 97.6°F | Resp 16

## 2016-01-15 DIAGNOSIS — D509 Iron deficiency anemia, unspecified: Secondary | ICD-10-CM | POA: Diagnosis not present

## 2016-01-15 DIAGNOSIS — D508 Other iron deficiency anemias: Secondary | ICD-10-CM

## 2016-01-15 DIAGNOSIS — Z79899 Other long term (current) drug therapy: Secondary | ICD-10-CM | POA: Diagnosis not present

## 2016-01-15 MED ORDER — SODIUM CHLORIDE 0.9 % IV SOLN
510.0000 mg | Freq: Once | INTRAVENOUS | Status: AC
  Administered 2016-01-15: 510 mg via INTRAVENOUS
  Filled 2016-01-15: qty 17

## 2016-01-15 MED ORDER — SODIUM CHLORIDE 0.9 % IV SOLN
Freq: Once | INTRAVENOUS | Status: AC
  Administered 2016-01-15: 14:00:00 via INTRAVENOUS
  Filled 2016-01-15: qty 1000

## 2016-02-23 ENCOUNTER — Telehealth: Payer: Self-pay | Admitting: Oncology

## 2016-02-23 NOTE — Telephone Encounter (Signed)
Pt works at [employer] and wants to do labs through them. Please call to tell her whether or not she has to pick up the form from you or if you can just send over lab orders. Per pt request, I cx'd lab appt on 11/29. Pt cell: [phone]. Thanks!

## 2016-02-23 NOTE — Telephone Encounter (Signed)
[employer] is fine.  She can either pick up the form or we can mail it to her.

## 2016-02-23 NOTE — Telephone Encounter (Signed)
Left voicemail with patient to pick up [employer] form at registration desk.

## 2016-03-02 ENCOUNTER — Other Ambulatory Visit: Payer: BLUE CROSS/BLUE SHIELD

## 2016-03-03 NOTE — Progress Notes (Signed)
[place]  Telephone:([phone] [phone]  ID: Donna Reyes OB: Oct 07, 1975  MR#: [id number]  [id number]  Patient Care Team: No Pcp Per Patient as PCP - General (General Practice)  CHIEF COMPLAINT: Iron deficiency anemia  INTERVAL HISTORY: Patient returns to clinic for repeat blood work and further evaluation. She currently feels great and states that her blood work is probably normal. She reports heavy menses, but no other bleeding. She has no neurologic complaints. She denies any recent fevers or illnesses. She has a good appetite and denies weight loss. She denies any chest pain or shortness of breath. She denies any nausea, vomiting, constipation, or diarrhea. She has no melena or hematochezia. She has no urinary complaints. Patient otherwise feels well and offers no further specific complaints.   REVIEW OF SYSTEMS:   Review of Systems  Constitutional: Negative for fever, malaise/fatigue and weight loss.  Respiratory: Negative.  Negative for cough and shortness of breath.   Cardiovascular: Negative.  Negative for chest pain and leg swelling.  Gastrointestinal: Negative.  Negative for abdominal pain, blood in stool and melena.  Genitourinary: Negative.   Musculoskeletal: Negative.   Neurological: Negative for weakness.  Psychiatric/Behavioral: Negative.  The patient is not nervous/anxious.     As per HPI. Otherwise, a complete review of systems is negative.  PAST MEDICAL HISTORY: Past Medical History:  Diagnosis Date  . Allergy   . Asthma   . Diabetes mellitus without complication ([place])   . Hypertension     PAST SURGICAL HISTORY: Past Surgical History:  Procedure Laterality Date  . CESAREAN SECTION     times 3    FAMILY HISTORY: Reviewed and unchanged. No reported history of malignancy or chronic disease.  ADVANCED DIRECTIVES (Y/N):  N  HEALTH MAINTENANCE: Social History  Substance Use Topics  . Smoking status: Never  Smoker  . Smokeless tobacco: Never Used  . Alcohol use No     Colonoscopy:  PAP:  Bone density:  Lipid panel:  Allergies  Allergen Reactions  . Lisinopril     Current Outpatient Prescriptions  Medication Sig Dispense Refill  . albuterol (PROVENTIL HFA;VENTOLIN HFA) 108 (90 Base) MCG/ACT inhaler Inhale 1 puff into the lungs every 4 (four) hours as needed for wheezing or shortness of breath.    Marland Kitchen amLODipine (NORVASC) 5 MG tablet 5 mg.  10   No current facility-administered medications for this visit.     OBJECTIVE: Vitals:   03/04/16 1343  BP: (!) 148/91  Pulse: 67  Resp: 20  Temp: 97.7 F (36.5 C)     Body mass index is 34.19 kg/m.    ECOG FS:0 - Asymptomatic  General: Well-developed, well-nourished, no acute distress. Eyes: Pink conjunctiva, anicteric sclera. HEENT: Normocephalic, moist mucous membranes, clear oropharnyx. Lungs: Clear to auscultation bilaterally. Heart: Regular rate and rhythm. No rubs, murmurs, or gallops. Abdomen: Soft, nontender, nondistended. No organomegaly noted, normoactive bowel sounds. Musculoskeletal: No edema, cyanosis, or clubbing. Neuro: Alert, answering all questions appropriately. Cranial nerves grossly intact. Skin: No rashes or petechiae noted. Psych: Normal affect. Lymphatics: No cervical, calvicular, axillary or inguinal LAD.   LAB RESULTS:  Lab Results  Component Value Date   NA 136 01/06/2016   K 3.9 01/06/2016   CL 107 01/06/2016   CO2 24 01/06/2016   GLUCOSE 117 (H) 01/06/2016   BUN 13 01/06/2016   CREATININE 0.63 01/06/2016   CALCIUM 9.5 01/06/2016   PROT 7.3 08/08/2015   ALBUMIN 3.9 08/08/2015   AST 14 (L)  08/08/2015   ALT 10 (L) 08/08/2015   ALKPHOS 49 08/08/2015   BILITOT 0.3 08/08/2015   GFRNONAA >60 01/06/2016   GFRAA >60 01/06/2016    Lab Results  Component Value Date   WBC 7.1 01/06/2016   NEUTROABS 2.3 05/06/2014   HGB 8.7 (L) 01/06/2016   HCT 28.2 (L) 01/06/2016   MCV 59.0 (L) 01/06/2016    PLT 397 01/06/2016   Lab Results  Component Value Date   IRON 11 (L) 01/01/2016   TIBC 499 (H) 01/01/2016   IRONPCTSAT 2 (L) 01/01/2016   Lab Results  Component Value Date   FERRITIN 4 (L) 01/01/2016     STUDIES: No results found.  ASSESSMENT: Iron deficiency anemia  PLAN:    1. Iron deficiency anemia: Likely secondary to patient's heavy menses. Patient reports she received IV iron in [place] several years ago, but it "did not work". She also has tried oral iron supplementation, but this gives her an upset stomach. Patient's hemoglobin and iron stores are normal today. Iron saturation slightly low but patient feeling well. Counseled patient to call if she starts feeling bad before next appointment.  She does not need an iron transfusion today. Return to for lab work and further evaluation in 3 months.  2. Thrombocytosis: Patient's platelet count is within normal limits today.   Patient expressed understanding and was in agreement with this plan. She also understands that She can call clinic at any time with any questions, concerns, or complaints.   Faythe Casa, NP  Patient was seen and evaluated independently and I agree with the assessment and plan as dictated above.  Patient last received IV Feraheme in October 2017.  Lloyd Huger, MD 03/07/16 10:23 PM

## 2016-03-04 ENCOUNTER — Inpatient Hospital Stay: Payer: BLUE CROSS/BLUE SHIELD | Attending: Oncology | Admitting: Oncology

## 2016-03-04 ENCOUNTER — Ambulatory Visit: Payer: BLUE CROSS/BLUE SHIELD | Admitting: Oncology

## 2016-03-04 ENCOUNTER — Ambulatory Visit: Payer: BLUE CROSS/BLUE SHIELD

## 2016-03-04 ENCOUNTER — Inpatient Hospital Stay: Payer: BLUE CROSS/BLUE SHIELD

## 2016-03-04 VITALS — BP 148/91 | HR 67 | Temp 97.7°F | Resp 20 | Wt 186.9 lb

## 2016-03-04 DIAGNOSIS — I10 Essential (primary) hypertension: Secondary | ICD-10-CM | POA: Diagnosis not present

## 2016-03-04 DIAGNOSIS — D473 Essential (hemorrhagic) thrombocythemia: Secondary | ICD-10-CM | POA: Diagnosis not present

## 2016-03-04 DIAGNOSIS — J45909 Unspecified asthma, uncomplicated: Secondary | ICD-10-CM | POA: Diagnosis not present

## 2016-03-04 DIAGNOSIS — Z79899 Other long term (current) drug therapy: Secondary | ICD-10-CM | POA: Diagnosis not present

## 2016-03-04 DIAGNOSIS — E119 Type 2 diabetes mellitus without complications: Secondary | ICD-10-CM | POA: Insufficient documentation

## 2016-03-04 DIAGNOSIS — D509 Iron deficiency anemia, unspecified: Secondary | ICD-10-CM | POA: Diagnosis not present

## 2016-03-04 DIAGNOSIS — N920 Excessive and frequent menstruation with regular cycle: Secondary | ICD-10-CM | POA: Insufficient documentation

## 2016-03-04 DIAGNOSIS — D500 Iron deficiency anemia secondary to blood loss (chronic): Secondary | ICD-10-CM

## 2016-03-04 NOTE — Progress Notes (Signed)
Patient here today for follow up regarding anemia. Patient reports feeling well today, denies any concerns.

## 2016-03-17 DIAGNOSIS — G4733 Obstructive sleep apnea (adult) (pediatric): Secondary | ICD-10-CM | POA: Diagnosis not present

## 2016-03-23 ENCOUNTER — Other Ambulatory Visit: Payer: BLUE CROSS/BLUE SHIELD

## 2016-03-25 ENCOUNTER — Ambulatory Visit: Payer: BLUE CROSS/BLUE SHIELD

## 2016-03-25 ENCOUNTER — Ambulatory Visit: Payer: BLUE CROSS/BLUE SHIELD | Admitting: Oncology

## 2016-04-01 ENCOUNTER — Other Ambulatory Visit: Payer: BLUE CROSS/BLUE SHIELD

## 2016-05-16 ENCOUNTER — Other Ambulatory Visit: Payer: Self-pay | Admitting: Family Medicine

## 2016-05-16 ENCOUNTER — Ambulatory Visit
Admission: RE | Admit: 2016-05-16 | Discharge: 2016-05-16 | Disposition: A | Discharge status: Home / Self Care | Payer: BLUE CROSS/BLUE SHIELD | Source: Ambulatory Visit | Attending: Family Medicine | Admitting: Family Medicine

## 2016-05-16 DIAGNOSIS — M7989 Other specified soft tissue disorders: Secondary | ICD-10-CM | POA: Diagnosis not present

## 2016-05-16 DIAGNOSIS — M25571 Pain in right ankle and joints of right foot: Secondary | ICD-10-CM | POA: Insufficient documentation

## 2016-05-16 DIAGNOSIS — R609 Edema, unspecified: Secondary | ICD-10-CM

## 2016-05-16 DIAGNOSIS — R52 Pain, unspecified: Secondary | ICD-10-CM

## 2016-06-08 NOTE — Progress Notes (Deleted)
[place]  [phone] [phone]  ID: Donna Reyes OB: 06/24/1975  MR#: [id number]  [id number]#:[id number]  Patient Care Team: Karen Kitchens, MD as PCP - General (Family Medicine)  CHIEF COMPLAINT: Iron deficiency anemia  INTERVAL HISTORY: Patient returns to clinic for repeat blood work and further evaluation. She currently feels great and states that her blood work is probably normal. She reports heavy menses, but no other bleeding. She has no neurologic complaints. She denies any recent fevers or illnesses. She has a good appetite and denies weight loss. She denies any chest pain or shortness of breath. She denies any nausea, vomiting, constipation, or diarrhea. She has no melena or hematochezia. She has no urinary complaints. Patient otherwise feels well and offers no further specific complaints.   REVIEW OF SYSTEMS:   Review of Systems  Constitutional: Negative for fever, malaise/fatigue and weight loss.  Respiratory: Negative.  Negative for cough and shortness of breath.   Cardiovascular: Negative.  Negative for chest pain and leg swelling.  Gastrointestinal: Negative.  Negative for abdominal pain, blood in stool and melena.  Genitourinary: Negative.   Musculoskeletal: Negative.   Neurological: Negative for weakness.  Psychiatric/Behavioral: Negative.  The patient is not nervous/anxious.     As per HPI. Otherwise, a complete review of systems is negative.  PAST MEDICAL HISTORY: Past Medical History:  Diagnosis Date  . Allergy   . Asthma   . Diabetes mellitus without complication ([place])   . Hypertension     PAST SURGICAL HISTORY: Past Surgical History:  Procedure Laterality Date  . CESAREAN SECTION     times 3    FAMILY HISTORY: Reviewed and unchanged. No reported history of malignancy or chronic disease.  ADVANCED DIRECTIVES (Y/N):  N  HEALTH MAINTENANCE: Social History  Substance Use Topics  . Smoking status:  Never Smoker  . Smokeless tobacco: Never Used  . Alcohol use No     Colonoscopy:  PAP:  Bone density:  Lipid panel:  Allergies  Allergen Reactions  . Lisinopril     Current Outpatient Prescriptions  Medication Sig Dispense Refill  . albuterol (PROVENTIL HFA;VENTOLIN HFA) 108 (90 Base) MCG/ACT inhaler Inhale 1 puff into the lungs every 4 (four) hours as needed for wheezing or shortness of breath.    Marland Kitchen amLODipine (NORVASC) 5 MG tablet 5 mg.  10   No current facility-administered medications for this visit.     OBJECTIVE: There were no vitals filed for this visit.   There is no height or weight on file to calculate BMI.    ECOG FS:0 - Asymptomatic  General: Well-developed, well-nourished, no acute distress. Eyes: Pink conjunctiva, anicteric sclera. HEENT: Normocephalic, moist mucous membranes, clear oropharnyx. Lungs: Clear to auscultation bilaterally. Heart: Regular rate and rhythm. No rubs, murmurs, or gallops. Abdomen: Soft, nontender, nondistended. No organomegaly noted, normoactive bowel sounds. Musculoskeletal: No edema, cyanosis, or clubbing. Neuro: Alert, answering all questions appropriately. Cranial nerves grossly intact. Skin: No rashes or petechiae noted. Psych: Normal affect. Lymphatics: No cervical, calvicular, axillary or inguinal LAD.   LAB RESULTS:  Lab Results  Component Value Date   NA 136 01/06/2016   K 3.9 01/06/2016   CL 107 01/06/2016   CO2 24 01/06/2016   GLUCOSE 117 (H) 01/06/2016   BUN 13 01/06/2016   CREATININE 0.63 01/06/2016   CALCIUM 9.5 01/06/2016   PROT 7.3 08/08/2015   ALBUMIN 3.9 08/08/2015   AST 14 (L) 08/08/2015   ALT 10 (L) 08/08/2015   ALKPHOS  49 08/08/2015   BILITOT 0.3 08/08/2015   GFRNONAA >60 01/06/2016   GFRAA >60 01/06/2016    Lab Results  Component Value Date   WBC 7.1 01/06/2016   NEUTROABS 2.3 05/06/2014   HGB 8.7 (L) 01/06/2016   HCT 28.2 (L) 01/06/2016   MCV 59.0 (L) 01/06/2016   PLT 397 01/06/2016    Lab Results  Component Value Date   IRON 11 (L) 01/01/2016   TIBC 499 (H) 01/01/2016   IRONPCTSAT 2 (L) 01/01/2016   Lab Results  Component Value Date   FERRITIN 4 (L) 01/01/2016     STUDIES: Dg Ankle Complete Right  Result Date: 05/16/2016 CLINICAL DATA:  Pt states no injury, pain and swelling lateral right ankle since Saturday. shielded EXAM: RIGHT ANKLE - COMPLETE 3+ VIEW COMPARISON:  None. FINDINGS: There is no evidence of fracture, dislocation, or joint effusion. There is no evidence of arthropathy or other focal bone abnormality. Soft tissues are unremarkable. IMPRESSION: Negative. Electronically Signed   By: Lucrezia Europe M.D.   On: 05/16/2016 14:01    ASSESSMENT: Iron deficiency anemia  PLAN:    1. Iron deficiency anemia: Likely secondary to patient's heavy menses. Patient reports she received IV iron in [place] several years ago, but it "did not work". She also has tried oral iron supplementation, but this gives her an upset stomach. Patient's hemoglobin and iron stores are normal today. Iron saturation slightly low but patient feeling well. Counseled patient to call if she starts feeling bad before next appointment.  She does not need an iron transfusion today. Return to for lab work and further evaluation in 3 months.  2. Thrombocytosis: Patient's platelet count is within normal limits today.   Patient expressed understanding and was in agreement with this plan. She also understands that She can call clinic at any time with any questions, concerns, or complaints.   Faythe Casa, NP  Patient was seen and evaluated independently and I agree with the assessment and plan as dictated above.  Patient last received IV Feraheme in October 2017.  Lloyd Huger, MD 06/08/16 10:15 PM

## 2016-06-10 ENCOUNTER — Inpatient Hospital Stay: Payer: BLUE CROSS/BLUE SHIELD | Admitting: Oncology

## 2016-06-10 ENCOUNTER — Inpatient Hospital Stay: Payer: BLUE CROSS/BLUE SHIELD

## 2016-07-12 ENCOUNTER — Ambulatory Visit (INDEPENDENT_AMBULATORY_CARE_PROVIDER_SITE_OTHER): Payer: BLUE CROSS/BLUE SHIELD | Admitting: Obstetrics and Gynecology

## 2016-07-12 ENCOUNTER — Encounter: Payer: Self-pay | Admitting: Obstetrics and Gynecology

## 2016-07-12 VITALS — BP 117/78 | HR 83 | Ht 62.0 in | Wt 193.4 lb

## 2016-07-12 DIAGNOSIS — R102 Pelvic and perineal pain: Secondary | ICD-10-CM

## 2016-07-12 DIAGNOSIS — N920 Excessive and frequent menstruation with regular cycle: Secondary | ICD-10-CM

## 2016-07-12 DIAGNOSIS — R875 Abnormal microbiological findings in specimens from female genital organs: Secondary | ICD-10-CM | POA: Diagnosis not present

## 2016-07-12 DIAGNOSIS — Z0000 Encounter for general adult medical examination without abnormal findings: Secondary | ICD-10-CM

## 2016-07-12 MED ORDER — DESOGESTREL-ETHINYL ESTRADIOL 0.15-0.02/0.01 MG (21/5) PO TABS: 1.0000 | ORAL_TABLET | Freq: Every day | ORAL | 2 refills | Status: DC

## 2016-07-12 NOTE — Progress Notes (Signed)
HPI:      Donna Reyes is a 41 y.o. [id number] who LMP was Patient's last menstrual period was 06/21/2016 (exact date).  Subjective:   She presents today for her annual examination.  She has diet-controlled diabetes and not taking any medicine at this time. Has hypertension controlled by medication. She does complain of cyclic pelvic pain which she thinks is similar to the pain she has had in the past with ovarian cysts. She says that after a short time this pain resolves. She also complains of bleeding after orgasm. This occurs even with nonpenetrating orgasms and presents the next morning with a half day of spotting. She states that her menstrual periods are 7 days long and always heavy. She says that at one time she was diagnosed with uterine fibroids but she says they didn't find them on her last ultrasound.    Hx: The following portions of the patient's history were reviewed and updated as appropriate:              She  has a past medical history of Allergy; Asthma; Diabetes mellitus without complication ([place]); Hypertension; and Sleep apnea. She  does not have any pertinent problems on file. She  has a past surgical history that includes Cesarean section and Tubal ligation. Her family history includes Cancer in her maternal grandmother; Diabetes in her maternal grandmother and mother; Hyperlipidemia in her father; Hypertension in her father. She  reports that she has never smoked. She has never used smokeless tobacco. She reports that she does not drink alcohol or use drugs. Current Outpatient Prescriptions on File Prior to Visit  Medication Sig Dispense Refill  . albuterol (PROVENTIL HFA;VENTOLIN HFA) 108 (90 Base) MCG/ACT inhaler Inhale 1 puff into the lungs every 4 (four) hours as needed for wheezing or shortness of breath.    Marland Kitchen amLODipine (NORVASC) 5 MG tablet 5 mg.  10   No current facility-administered medications on file prior to visit.          Review of Systems:   Review of Systems  Constitutional: Denied constitutional symptoms, night sweats, recent illness, fatigue, fever, insomnia and weight loss.  Eyes: Denied eye symptoms, eye pain, photophobia, vision change and visual disturbance.  Ears/Nose/Throat/Neck: Denied ear, nose, throat or neck symptoms, hearing loss, nasal discharge, sinus congestion and sore throat.  Cardiovascular: Denied cardiovascular symptoms, arrhythmia, chest pain/pressure, edema, exercise intolerance, orthopnea and palpitations.  Respiratory: Denied pulmonary symptoms, asthma, pleuritic pain, productive sputum, cough, dyspnea and wheezing.  Gastrointestinal: Denied, gastro-esophageal reflux, melena, nausea and vomiting.  Genitourinary: See HPI for additional information.  Musculoskeletal: Denied musculoskeletal symptoms, stiffness, swelling, muscle weakness and myalgia.  Dermatologic: Denied dermatology symptoms, rash and scar.  Neurologic: Denied neurology symptoms, dizziness, headache, neck pain and syncope.  Psychiatric: Denied psychiatric symptoms, anxiety and depression.  Endocrine: Denied endocrine symptoms including hot flashes and night sweats.   Meds:   Current Outpatient Prescriptions on File Prior to Visit  Medication Sig Dispense Refill  . albuterol (PROVENTIL HFA;VENTOLIN HFA) 108 (90 Base) MCG/ACT inhaler Inhale 1 puff into the lungs every 4 (four) hours as needed for wheezing or shortness of breath.    Marland Kitchen amLODipine (NORVASC) 5 MG tablet 5 mg.  10   No current facility-administered medications on file prior to visit.     Objective:     Vitals:   07/12/16 0809  BP: 117/78  Pulse: 83              Physical examination General  NAD, Conversant  HEENT Atraumatic; Op clear with mmm.  Normo-cephalic. Pupils reactive. Anicteric sclerae  Thyroid/Neck Smooth without nodularity or enlargement. Normal ROM.  Neck Supple.  Skin No rashes, lesions or ulceration. Normal palpated skin turgor. No nodularity.   Breasts: No masses or discharge.  Symmetric.  No axillary adenopathy.  Lungs: Clear to auscultation.No rales or wheezes. Normal Respiratory effort, no retractions.  Heart: NSR.  No murmurs or rubs appreciated. No periferal edema  Abdomen: Soft.  Non-tender.  No masses.  No HSM. No hernia  Extremities: Moves all appropriately.  Normal ROM for age. No lymphadenopathy.  Neuro: Oriented to PPT.  Normal mood. Normal affect.     Pelvic:   Vulva: Normal appearance.  No lesions.  Vagina: No lesions or abnormalities noted.  Support: Normal pelvic support.  Urethra No masses tenderness or scarring.  Meatus Normal size without lesions or prolapse.  Cervix: Normal appearance.  No lesions.  Anus: Normal exam.  No lesions.  Perineum: Normal exam.  No lesions.        Bimanual   Uterus: 12 weeks - mildly tender [place].  AV.  Adnexae: No masses.  Non-tender to palpation.  Cul-de-sac: Negative for abnormality.      Assessment:    [id number] Patient Active Problem List   Diagnosis Date Noted  . Iron deficiency anemia 12/30/2015     1. Encounter for annual physical exam   2. Pelvic pain in female   3. Menorrhagia with regular cycle        Plan:            1.  Basic Screening Recommendations The basic screening recommendations for asymptomatic women were discussed with the patient during her visit.  The age-appropriate recommendations were discussed with her and the rational for the tests reviewed.  When I am informed by the patient that another primary care physician has previously obtained the age-appropriate tests and they are up-to-date, only outstanding tests are ordered and referrals given as necessary.  Abnormal results of tests will be discussed with her when all of her results are completed. Pap performed 2.  Patient would like to start OCPs for better cycle regulation, decreasing her menstrual flow, and possible prevention of ovarian cysts or cyclic pelvic pain. They may also help  with the bleeding she has noted after orgasm. She will do a 3 month trial. OCPs The risks /benefits of OCPs have been explained to the patient in detail.  Product literature has been given to her.  I have instructed her in the use of OCPs and have given her literature reinforcing this information.  I have explained to the patient that OCPs are not as effective for birth control during the first month of use, and that another form of contraception should be used during this time.  Both first-day start and Sunday start have been explained.  The risks and benefits of each was discussed.  She has been made aware of  the fact that other medications may affect the efficacy of OCPs.  I have answered all of her questions, and I believe that she has an understanding of the effectiveness and use of OCPs. I have instructed her to get her blood pressure checked several times during the first month of OCPs as her dose of antihypertensive may change.     F/U  Return in about 3 months (around 10/11/2016).  Finis Bud, M.D. 07/12/2016 8:58 AM

## 2016-07-14 LAB — PAP IG AND HPV HIGH-RISK
HPV, HIGH-RISK: NEGATIVE
PAP Smear Comment: 0

## 2016-07-15 ENCOUNTER — Telehealth: Payer: Self-pay

## 2016-07-15 NOTE — Telephone Encounter (Signed)
Informed pt of neg test results per provider

## 2016-07-15 NOTE — Telephone Encounter (Signed)
-----   Message from Harlin Heys, MD sent at 07/15/2016 10:24 AM EDT ----- Negative Pap and HPV

## 2016-07-18 ENCOUNTER — Other Ambulatory Visit: Payer: Self-pay | Admitting: Obstetrics and Gynecology

## 2016-07-18 ENCOUNTER — Other Ambulatory Visit: Payer: Self-pay | Admitting: Family Medicine

## 2016-07-18 DIAGNOSIS — Z1231 Encounter for screening mammogram for malignant neoplasm of breast: Secondary | ICD-10-CM

## 2016-08-12 ENCOUNTER — Ambulatory Visit
Admission: RE | Admit: 2016-08-12 | Discharge: 2016-08-12 | Disposition: A | Discharge status: Home / Self Care | Payer: BLUE CROSS/BLUE SHIELD | Source: Ambulatory Visit | Attending: Obstetrics and Gynecology | Admitting: Obstetrics and Gynecology

## 2016-08-12 DIAGNOSIS — Z1231 Encounter for screening mammogram for malignant neoplasm of breast: Secondary | ICD-10-CM

## 2016-08-16 ENCOUNTER — Other Ambulatory Visit: Payer: Self-pay | Admitting: *Deleted

## 2016-08-16 ENCOUNTER — Inpatient Hospital Stay
Admission: RE | Admit: 2016-08-16 | Discharge: 2016-08-16 | Disposition: A | Discharge status: Home / Self Care | Payer: Self-pay | Source: Ambulatory Visit | Attending: *Deleted | Admitting: *Deleted

## 2016-08-16 DIAGNOSIS — Z9289 Personal history of other medical treatment: Secondary | ICD-10-CM

## 2016-08-17 NOTE — Progress Notes (Deleted)
[place]  [phone] Fax:(336) [phone]  ID: Donna Reyes OB: 22-Apr-1975  MR#: [id number]  LKG#:[id number]  Patient Care Team: Karen Kitchens, MD as PCP - General (Family Medicine)  CHIEF COMPLAINT: Iron deficiency anemia  INTERVAL HISTORY: Patient returns to clinic for repeat blood work and further evaluation. She currently feels great and states that her blood work is probably normal. She reports heavy menses, but no other bleeding. She has no neurologic complaints. She denies any recent fevers or illnesses. She has a good appetite and denies weight loss. She denies any chest pain or shortness of breath. She denies any nausea, vomiting, constipation, or diarrhea. She has no melena or hematochezia. She has no urinary complaints. Patient otherwise feels well and offers no further specific complaints.   REVIEW OF SYSTEMS:   Review of Systems  Constitutional: Negative for fever, malaise/fatigue and weight loss.  Respiratory: Negative.  Negative for cough and shortness of breath.   Cardiovascular: Negative.  Negative for chest pain and leg swelling.  Gastrointestinal: Negative.  Negative for abdominal pain, blood in stool and melena.  Genitourinary: Negative.   Musculoskeletal: Negative.   Neurological: Negative for weakness.  Psychiatric/Behavioral: Negative.  The patient is not nervous/anxious.     As per HPI. Otherwise, a complete review of systems is negative.  PAST MEDICAL HISTORY: Past Medical History:  Diagnosis Date  . Allergy   . Asthma   . Diabetes mellitus without complication ([place])   . Hypertension   . Sleep apnea     PAST SURGICAL HISTORY: Past Surgical History:  Procedure Laterality Date  . CESAREAN SECTION     times 3  . TUBAL LIGATION      FAMILY HISTORY: Reviewed and unchanged. No reported history of malignancy or chronic disease.  ADVANCED DIRECTIVES (Y/N):  N  HEALTH MAINTENANCE: Social History    Substance Use Topics  . Smoking status: Never Smoker  . Smokeless tobacco: Never Used  . Alcohol use No     Colonoscopy:  PAP:  Bone density:  Lipid panel:  Allergies  Allergen Reactions  . Lisinopril     Current Outpatient Prescriptions  Medication Sig Dispense Refill  . albuterol (PROVENTIL HFA;VENTOLIN HFA) 108 (90 Base) MCG/ACT inhaler Inhale 1 puff into the lungs every 4 (four) hours as needed for wheezing or shortness of breath.    Marland Kitchen amLODipine (NORVASC) 5 MG tablet 5 mg.  10  . desogestrel-ethinyl estradiol (MIRCETTE) 0.15-0.02/0.01 MG (21/5) tablet Take 1 tablet by mouth at bedtime. 1 Package 2   No current facility-administered medications for this visit.     OBJECTIVE: There were no vitals filed for this visit.   There is no height or weight on file to calculate BMI.    ECOG FS:0 - Asymptomatic  General: Well-developed, well-nourished, no acute distress. Eyes: Pink conjunctiva, anicteric sclera. HEENT: Normocephalic, moist mucous membranes, clear oropharnyx. Lungs: Clear to auscultation bilaterally. Heart: Regular rate and rhythm. No rubs, murmurs, or gallops. Abdomen: Soft, nontender, nondistended. No organomegaly noted, normoactive bowel sounds. Musculoskeletal: No edema, cyanosis, or clubbing. Neuro: Alert, answering all questions appropriately. Cranial nerves grossly intact. Skin: No rashes or petechiae noted. Psych: Normal affect. Lymphatics: No cervical, calvicular, axillary or inguinal LAD.   LAB RESULTS:  Lab Results  Component Value Date   NA 136 01/06/2016   K 3.9 01/06/2016   CL 107 01/06/2016   CO2 24 01/06/2016   GLUCOSE 117 (H) 01/06/2016   BUN 13 01/06/2016   CREATININE 0.63  01/06/2016   CALCIUM 9.5 01/06/2016   PROT 7.3 08/08/2015   ALBUMIN 3.9 08/08/2015   AST 14 (L) 08/08/2015   ALT 10 (L) 08/08/2015   ALKPHOS 49 08/08/2015   BILITOT 0.3 08/08/2015   GFRNONAA >60 01/06/2016   GFRAA >60 01/06/2016    Lab Results  Component  Value Date   WBC 7.1 01/06/2016   NEUTROABS 2.3 05/06/2014   HGB 8.7 (L) 01/06/2016   HCT 28.2 (L) 01/06/2016   MCV 59.0 (L) 01/06/2016   PLT 397 01/06/2016   Lab Results  Component Value Date   IRON 11 (L) 01/01/2016   TIBC 499 (H) 01/01/2016   IRONPCTSAT 2 (L) 01/01/2016   Lab Results  Component Value Date   FERRITIN 4 (L) 01/01/2016     STUDIES: Mm Digital Screening Bilateral  Result Date: 08/16/2016 CLINICAL DATA:  Screening. EXAM: DIGITAL SCREENING BILATERAL MAMMOGRAM WITH CAD COMPARISON:  Previous exam(s). ACR Breast Density Category c: The breast tissue is heterogeneously dense, which may obscure small masses. FINDINGS: There are no findings suspicious for malignancy. Images were processed with CAD. IMPRESSION: No mammographic evidence of malignancy. A result letter of this screening mammogram will be mailed directly to the patient. RECOMMENDATION: Screening mammogram in one year. (Code:SM-B-01Y) BI-RADS CATEGORY  1: Negative. Electronically Signed   By: Abelardo Diesel M.D.   On: 08/16/2016 13:28   Mm Outside Films Mammo  Result Date: 08/16/2016 This examination belongs to an outside facility and is stored here for comparison purposes only.  Contact the originating outside institution for any associated report or interpretation.   ASSESSMENT: Iron deficiency anemia  PLAN:    1. Iron deficiency anemia: Likely secondary to patient's heavy menses. Patient reports she received IV iron in [place] several years ago, but it "did not work". She also has tried oral iron supplementation, but this gives her an upset stomach. Patient's hemoglobin and iron stores are normal today. Iron saturation slightly low but patient feeling well. Counseled patient to call if she starts feeling bad before next appointment.  She does not need an iron transfusion today. Return to for lab work and further evaluation in 3 months.  2. Thrombocytosis: Patient's platelet count is within normal limits today.     Patient expressed understanding and was in agreement with this plan. She also understands that She can call clinic at any time with any questions, concerns, or complaints.   Faythe Casa, NP  Patient was seen and evaluated independently and I agree with the assessment and plan as dictated above.  Patient last received IV Feraheme in October 2017.  Lloyd Huger, MD 08/17/16 11:07 PM

## 2016-08-18 ENCOUNTER — Telehealth: Payer: Self-pay

## 2016-08-18 NOTE — Telephone Encounter (Signed)
Pt informed of negative mammogram results.

## 2016-08-18 NOTE — Telephone Encounter (Signed)
-----   Message from Harlin Heys, MD sent at 08/17/2016  5:39 PM EDT ----- All results within normal range.

## 2016-08-18 NOTE — Telephone Encounter (Signed)
Left message on pts voicemail- neg mammogram results per provider

## 2016-08-19 ENCOUNTER — Inpatient Hospital Stay: Payer: BLUE CROSS/BLUE SHIELD | Attending: Oncology | Admitting: Oncology

## 2016-08-19 ENCOUNTER — Inpatient Hospital Stay: Payer: BLUE CROSS/BLUE SHIELD

## 2016-10-11 ENCOUNTER — Encounter: Payer: BLUE CROSS/BLUE SHIELD | Admitting: Obstetrics and Gynecology

## 2016-12-13 ENCOUNTER — Emergency Department
Admission: EM | Admit: 2016-12-13 | Discharge: 2016-12-13 | Disposition: A | Discharge status: Home / Self Care | Payer: 59 | Attending: Emergency Medicine | Admitting: Emergency Medicine

## 2016-12-13 ENCOUNTER — Encounter: Payer: Self-pay | Admitting: Emergency Medicine

## 2016-12-13 ENCOUNTER — Emergency Department: Payer: 59

## 2016-12-13 DIAGNOSIS — B9689 Other specified bacterial agents as the cause of diseases classified elsewhere: Secondary | ICD-10-CM | POA: Diagnosis not present

## 2016-12-13 DIAGNOSIS — J45909 Unspecified asthma, uncomplicated: Secondary | ICD-10-CM | POA: Insufficient documentation

## 2016-12-13 DIAGNOSIS — N760 Acute vaginitis: Secondary | ICD-10-CM | POA: Insufficient documentation

## 2016-12-13 DIAGNOSIS — R109 Unspecified abdominal pain: Secondary | ICD-10-CM | POA: Diagnosis present

## 2016-12-13 DIAGNOSIS — I10 Essential (primary) hypertension: Secondary | ICD-10-CM | POA: Diagnosis not present

## 2016-12-13 DIAGNOSIS — R102 Pelvic and perineal pain: Secondary | ICD-10-CM | POA: Diagnosis not present

## 2016-12-13 DIAGNOSIS — Z79899 Other long term (current) drug therapy: Secondary | ICD-10-CM | POA: Insufficient documentation

## 2016-12-13 DIAGNOSIS — E119 Type 2 diabetes mellitus without complications: Secondary | ICD-10-CM | POA: Diagnosis not present

## 2016-12-13 DIAGNOSIS — D509 Iron deficiency anemia, unspecified: Secondary | ICD-10-CM

## 2016-12-13 DIAGNOSIS — A5901 Trichomonal vulvovaginitis: Secondary | ICD-10-CM

## 2016-12-13 DIAGNOSIS — R1030 Lower abdominal pain, unspecified: Secondary | ICD-10-CM | POA: Diagnosis not present

## 2016-12-13 LAB — COMPREHENSIVE METABOLIC PANEL
ALBUMIN: 3.8 g/dL (ref 3.5–5.0)
ALK PHOS: 45 U/L (ref 38–126)
ALT: 11 U/L — ABNORMAL LOW (ref 14–54)
ANION GAP: 6 (ref 5–15)
AST: 14 U/L — ABNORMAL LOW (ref 15–41)
BUN: 11 mg/dL (ref 6–20)
CALCIUM: 8.6 mg/dL — AB (ref 8.9–10.3)
CO2: 25 mmol/L (ref 22–32)
Chloride: 106 mmol/L (ref 101–111)
Creatinine, Ser: 0.62 mg/dL (ref 0.44–1.00)
GLUCOSE: 103 mg/dL — AB (ref 65–99)
POTASSIUM: 3.9 mmol/L (ref 3.5–5.1)
Sodium: 137 mmol/L (ref 135–145)
TOTAL PROTEIN: 7.3 g/dL (ref 6.5–8.1)
Total Bilirubin: 0.5 mg/dL (ref 0.3–1.2)

## 2016-12-13 LAB — CBC
HCT: 30 % — ABNORMAL LOW (ref 35.0–47.0)
HEMOGLOBIN: 9.2 g/dL — AB (ref 12.0–16.0)
MCH: 20.4 pg — AB (ref 26.0–34.0)
MCHC: 30.8 g/dL — ABNORMAL LOW (ref 32.0–36.0)
MCV: 66.3 fL — AB (ref 80.0–100.0)
Platelets: 365 10*3/uL (ref 150–440)
RBC: 4.52 MIL/uL (ref 3.80–5.20)
RDW: 16.7 % — AB (ref 11.5–14.5)
WBC: 5.5 10*3/uL (ref 3.6–11.0)

## 2016-12-13 LAB — CHLAMYDIA/NGC RT PCR (ARMC ONLY)
Chlamydia Tr: NOT DETECTED
N GONORRHOEAE: NOT DETECTED

## 2016-12-13 LAB — WET PREP, GENITAL
SPERM: NONE SEEN
YEAST WET PREP: NONE SEEN

## 2016-12-13 MED ORDER — KETOROLAC TROMETHAMINE 60 MG/2ML IM SOLN
60.0000 mg | Freq: Once | INTRAMUSCULAR | Status: AC
  Administered 2016-12-13: 60 mg via INTRAMUSCULAR
  Filled 2016-12-13: qty 2

## 2016-12-13 MED ORDER — FERROUS SULFATE DRIED ER 160 (50 FE) MG PO TBCR: 160.0000 mg | EXTENDED_RELEASE_TABLET | Freq: Every day | ORAL | 6 refills | Status: DC

## 2016-12-13 MED ORDER — METRONIDAZOLE 500 MG PO TABS
500.0000 mg | ORAL_TABLET | Freq: Once | ORAL | Status: AC
  Administered 2016-12-13: 500 mg via ORAL
  Filled 2016-12-13: qty 1

## 2016-12-13 MED ORDER — METRONIDAZOLE 500 MG PO TABS: 500.0000 mg | ORAL_TABLET | Freq: Two times a day (BID) | ORAL | 0 refills | Status: AC

## 2016-12-13 NOTE — ED Provider Notes (Signed)
labs reflect a chronic iron deficiency anemia. We will ensure that she is taking iron daily. She will also be treated for Trichomonas. She is stable for outpatient follow-up.   Earleen Newport, MD 12/13/16 [phone]

## 2016-12-13 NOTE — ED Notes (Signed)
Pt in u/s at this time

## 2016-12-13 NOTE — ED Provider Notes (Signed)
[facility] Emergency Department Provider Note   ____________________________________________   First MD Initiated Contact with Patient 12/13/16 0602     (approximate)  I have reviewed the triage vital signs and the nursing notes.   HISTORY  Chief Complaint Abdominal Pain    HPI Donna Reyes is a 41 y.o. female Who comes into the hospital today with some abdominal pain. The patient reports that she's had this abdominal pain for the past 3 days. Overnight the pain was in her lower abdomen and it got worse. The patient has not taken anything for the pain. She worked through it yesterday and then came home. She didn't have much of an appetite and couldn't sleep. She has a lot of gas and reports that sometimes she'll pass the gas and her stomach will blow back up like a balloon. The patient's last bowel movement was around 1 PM and it was normal. She has been urinating well. She denies any nausea or vomiting. The patient has had no fevers but endorses some mild shortness of breath and the pain was at its worse. She has no chest pain. The patient rates her pain a 6 out of 10 in intensity currently. The patient is here today for evaluation of her abdominal pain.   Past Medical History:  Diagnosis Date  . Allergy   . Asthma   . Diabetes mellitus without complication ([place])   . Hypertension   . Sleep apnea     Patient Active Problem List   Diagnosis Date Noted  . Iron deficiency anemia 12/30/2015    Past Surgical History:  Procedure Laterality Date  . CESAREAN SECTION     times 3  . TUBAL LIGATION      Prior to Admission medications   Medication Sig Start Date End Date Taking? Authorizing Provider  amLODipine (NORVASC) 5 MG tablet Take 5 mg by mouth daily.    Yes [provider]  desogestrel-ethinyl estradiol (MIRCETTE) 0.15-0.02/0.01 MG (21/5) tablet Take 1 tablet by mouth at bedtime. Patient not taking: Reported on 12/13/2016  07/12/16   Harlin Heys, MD  metroNIDAZOLE (FLAGYL) 500 MG tablet Take 1 tablet (500 mg total) by mouth 2 (two) times daily. 12/13/16 12/20/16  Loney Hering, MD    Allergies Lisinopril  Family History  Problem Relation Age of Onset  . Diabetes Mother   . Diabetes Maternal Grandmother   . Cancer Maternal Grandmother   . Breast cancer Maternal Grandmother 69  . Hypertension Father   . Hyperlipidemia Father     Social History Social History  Substance Use Topics  . Smoking status: Never Smoker  . Smokeless tobacco: Never Used  . Alcohol use No    Review of Systems  Constitutional: No fever/chills Eyes: No visual changes. ENT: No sore throat. Cardiovascular: Denies chest pain. Respiratory:  shortness of breath. Gastrointestinal: abdominal pain.  No nausea, no vomiting.  No diarrhea.  No constipation. Genitourinary: Negative for dysuria. Musculoskeletal: Negative for back pain. Skin: Negative for rash. Neurological: Negative for headaches, focal weakness or numbness.   ____________________________________________   PHYSICAL EXAM:  VITAL SIGNS: ED Triage Vitals  Enc Vitals Group     BP 12/13/16 0550 (!) 135/91     Pulse Rate 12/13/16 0550 77     Resp 12/13/16 0550 20     Temp 12/13/16 0550 98.4 F (36.9 C)     Temp Source 12/13/16 0550 Oral     SpO2 12/13/16 0550 99 %  Weight 12/13/16 0548 194 lb (88 kg)     Height 12/13/16 0548 5\' 2"  (1.575 m)     Head Circumference --      Peak Flow --      Pain Score 12/13/16 0548 6     Pain Loc --      Pain Edu? --      Excl. in [place]? --     Constitutional: Alert and oriented. Well appearing and in moderate distress. Eyes: Conjunctivae are normal. PERRL. EOMI. Head: Atraumatic. Nose: No congestion/rhinnorhea. Mouth/Throat: Mucous membranes are moist.  Oropharynx non-erythematous. Cardiovascular: Normal rate, regular rhythm. Grossly normal heart sounds.  Good peripheral circulation. Respiratory: Normal  respiratory effort.  No retractions. Lungs CTAB. Gastrointestinal: Soft with some mid suprapubic tenderness to palpation. No distention. Genitourinary: normal external genitalia with no cervical motion tenderness to palpation, mild discharged and enlarged uterus Musculoskeletal: No lower extremity tenderness nor edema. Neurologic:  Normal speech and language.  Skin:  Skin is warm, dry and intact. Psychiatric: Mood and affect are normal.   ____________________________________________   LABS (all labs ordered are listed, but only abnormal results are displayed)  Labs Reviewed  WET PREP, GENITAL - Abnormal; Notable for the following:       Result Value   Trich, Wet Prep PRESENT (*)    Clue Cells Wet Prep HPF POC PRESENT (*)    WBC, Wet Prep HPF POC FEW (*)    All other components within normal limits  CHLAMYDIA/NGC RT PCR ([facility] ONLY)  CBC  COMPREHENSIVE METABOLIC PANEL  URINALYSIS, COMPLETE (UACMP) WITH MICROSCOPIC   ____________________________________________  EKG  none ____________________________________________  RADIOLOGY  US Pelvis Transvanginal Non-ob (tv Only)  Result Date: 12/13/2016 CLINICAL DATA:  Three days of pelvic pain. History of 3 previous Cesarean sections. EXAM: TRANSABDOMINAL AND TRANSVAGINAL ULTRASOUND OF PELVIS DOPPLER ULTRASOUND OF OVARIES TECHNIQUE: Both transabdominal and transvaginal ultrasound examinations of the pelvis were performed. Transabdominal technique was performed for global imaging of the pelvis including uterus, ovaries, adnexal regions, and pelvic cul-de-sac. It was necessary to proceed with endovaginal exam following the transabdominal exam to visualize the uterus and cervix. Color and duplex Doppler ultrasound was utilized to evaluate blood flow to the ovaries. COMPARISON:  Pelvic ultrasound of May 06, 2014 FINDINGS: Uterus Measurements: 11.8 x 5.7 x 7.1 cm. The myometrial echotexture is normal. Endometrium Thickness: 16 mm.  No  focal abnormality visualized. Right ovary Measurements: 4.1 x 2.2 x 3.2 cm. Normal appearance/no adnexal mass. Left ovary Measurements: 3.2 x 1.9 x 3.4 cm. Normal appearance/no adnexal mass. Pulsed Doppler evaluation of both ovaries demonstrates normal low-resistance arterial and venous waveforms. Other findings No abnormal free fluid. IMPRESSION: Normal pelvic ultrasound examination. There no findings on this study to explain the patient's symptoms. Electronically Signed   By: David  Martinique M.D.   On: 12/13/2016 08:06   US Pelvis Complete  Result Date: 12/13/2016 CLINICAL DATA:  Three days of pelvic pain. History of 3 previous Cesarean sections. EXAM: TRANSABDOMINAL AND TRANSVAGINAL ULTRASOUND OF PELVIS DOPPLER ULTRASOUND OF OVARIES TECHNIQUE: Both transabdominal and transvaginal ultrasound examinations of the pelvis were performed. Transabdominal technique was performed for global imaging of the pelvis including uterus, ovaries, adnexal regions, and pelvic cul-de-sac. It was necessary to proceed with endovaginal exam following the transabdominal exam to visualize the uterus and cervix. Color and duplex Doppler ultrasound was utilized to evaluate blood flow to the ovaries. COMPARISON:  Pelvic ultrasound of May 06, 2014 FINDINGS: Uterus Measurements: 11.8 x 5.7 x 7.1 cm. The  myometrial echotexture is normal. Endometrium Thickness: 16 mm.  No focal abnormality visualized. Right ovary Measurements: 4.1 x 2.2 x 3.2 cm. Normal appearance/no adnexal mass. Left ovary Measurements: 3.2 x 1.9 x 3.4 cm. Normal appearance/no adnexal mass. Pulsed Doppler evaluation of both ovaries demonstrates normal low-resistance arterial and venous waveforms. Other findings No abnormal free fluid. IMPRESSION: Normal pelvic ultrasound examination. There no findings on this study to explain the patient's symptoms. Electronically Signed   By: David  Martinique M.D.   On: 12/13/2016 08:06   US Pelvic Doppler (torsion R/o Or Mass Arterial  Flow)  Result Date: 12/13/2016 CLINICAL DATA:  Three days of pelvic pain. History of 3 previous Cesarean sections. EXAM: TRANSABDOMINAL AND TRANSVAGINAL ULTRASOUND OF PELVIS DOPPLER ULTRASOUND OF OVARIES TECHNIQUE: Both transabdominal and transvaginal ultrasound examinations of the pelvis were performed. Transabdominal technique was performed for global imaging of the pelvis including uterus, ovaries, adnexal regions, and pelvic cul-de-sac. It was necessary to proceed with endovaginal exam following the transabdominal exam to visualize the uterus and cervix. Color and duplex Doppler ultrasound was utilized to evaluate blood flow to the ovaries. COMPARISON:  Pelvic ultrasound of May 06, 2014 FINDINGS: Uterus Measurements: 11.8 x 5.7 x 7.1 cm. The myometrial echotexture is normal. Endometrium Thickness: 16 mm.  No focal abnormality visualized. Right ovary Measurements: 4.1 x 2.2 x 3.2 cm. Normal appearance/no adnexal mass. Left ovary Measurements: 3.2 x 1.9 x 3.4 cm. Normal appearance/no adnexal mass. Pulsed Doppler evaluation of both ovaries demonstrates normal low-resistance arterial and venous waveforms. Other findings No abnormal free fluid. IMPRESSION: Normal pelvic ultrasound examination. There no findings on this study to explain the patient's symptoms. Electronically Signed   By: David  Martinique M.D.   On: 12/13/2016 08:06    ____________________________________________   PROCEDURES  Procedure(s) performed: None  Procedures  Critical Care performed: No  ____________________________________________   INITIAL IMPRESSION / ASSESSMENT AND PLAN / ED COURSE  Pertinent labs & imaging results that were available during my care of the patient were reviewed by me and considered in my medical decision making (see chart for details).  this is a 41 year old female who comes into the hospital today with abdominal pain. I did send a wet prep and GC and chlamydia on the patient and her wet prep came  back positive for Trichomonas. I also sent the patient for an ultrasound a pain could be due to fibroids. The patient's last menstrual period was on August 15. The patient will receive some metronidazole and her care will be signed out to Dr. Jimmye Norman who will follow the results of the blood work and the urinalysis. He will also determine the patient's disposition.       ____________________________________________   FINAL CLINICAL IMPRESSION(S) / ED DIAGNOSES  Final diagnoses:  Abdominal pain  Lower abdominal pain  Trichomonas vaginitis  Bacterial vaginosis      NEW MEDICATIONS STARTED DURING THIS VISIT:  New Prescriptions   METRONIDAZOLE (FLAGYL) 500 MG TABLET    Take 1 tablet (500 mg total) by mouth 2 (two) times daily.     Note:  This document was prepared using Dragon voice recognition software and may include unintentional dictation errors.    Loney Hering, MD 12/13/16 [phone]

## 2016-12-13 NOTE — ED Triage Notes (Signed)
Patient ambulatory to triage with steady gait, without difficulty or distress noted; pt reports lower abd pain, back pain and gas x 3 days; denies hx of same

## 2016-12-20 DIAGNOSIS — H5210 Myopia, unspecified eye: Secondary | ICD-10-CM | POA: Diagnosis not present

## 2016-12-20 DIAGNOSIS — H5213 Myopia, bilateral: Secondary | ICD-10-CM | POA: Diagnosis not present

## 2017-01-09 ENCOUNTER — Other Ambulatory Visit: Payer: Self-pay | Admitting: Family Medicine

## 2017-01-09 ENCOUNTER — Ambulatory Visit
Admission: RE | Admit: 2017-01-09 | Discharge: 2017-01-09 | Disposition: A | Discharge status: Home / Self Care | Payer: 59 | Source: Ambulatory Visit | Attending: Family Medicine | Admitting: Family Medicine

## 2017-01-09 DIAGNOSIS — R109 Unspecified abdominal pain: Secondary | ICD-10-CM

## 2017-05-15 ENCOUNTER — Other Ambulatory Visit: Payer: Self-pay | Admitting: Family Medicine

## 2017-05-15 DIAGNOSIS — N6310 Unspecified lump in the right breast, unspecified quadrant: Secondary | ICD-10-CM

## 2017-05-18 ENCOUNTER — Ambulatory Visit
Admission: RE | Admit: 2017-05-18 | Discharge: 2017-05-18 | Disposition: A | Discharge status: Home / Self Care | Payer: BLUE CROSS/BLUE SHIELD | Source: Ambulatory Visit | Attending: Family Medicine | Admitting: Family Medicine

## 2017-05-18 ENCOUNTER — Other Ambulatory Visit: Payer: Self-pay | Admitting: Family Medicine

## 2017-05-18 DIAGNOSIS — N6310 Unspecified lump in the right breast, unspecified quadrant: Secondary | ICD-10-CM | POA: Diagnosis present

## 2017-05-18 DIAGNOSIS — N6312 Unspecified lump in the right breast, upper inner quadrant: Secondary | ICD-10-CM | POA: Diagnosis not present

## 2017-05-18 DIAGNOSIS — R922 Inconclusive mammogram: Secondary | ICD-10-CM | POA: Diagnosis not present

## 2017-05-18 DIAGNOSIS — N6489 Other specified disorders of breast: Secondary | ICD-10-CM | POA: Diagnosis not present

## 2017-05-18 DIAGNOSIS — R928 Other abnormal and inconclusive findings on diagnostic imaging of breast: Secondary | ICD-10-CM

## 2017-05-25 ENCOUNTER — Ambulatory Visit
Admission: RE | Admit: 2017-05-25 | Discharge: 2017-05-25 | Disposition: A | Discharge status: Home / Self Care | Payer: BLUE CROSS/BLUE SHIELD | Source: Ambulatory Visit | Attending: Family Medicine | Admitting: Family Medicine

## 2017-05-25 DIAGNOSIS — N6310 Unspecified lump in the right breast, unspecified quadrant: Secondary | ICD-10-CM | POA: Diagnosis present

## 2017-05-25 DIAGNOSIS — N6312 Unspecified lump in the right breast, upper inner quadrant: Secondary | ICD-10-CM | POA: Diagnosis not present

## 2017-05-25 DIAGNOSIS — R229 Localized swelling, mass and lump, unspecified: Secondary | ICD-10-CM | POA: Insufficient documentation

## 2017-05-25 DIAGNOSIS — R928 Other abnormal and inconclusive findings on diagnostic imaging of breast: Secondary | ICD-10-CM | POA: Diagnosis not present

## 2017-05-25 DIAGNOSIS — N6081 Other benign mammary dysplasias of right breast: Secondary | ICD-10-CM | POA: Insufficient documentation

## 2017-05-25 DIAGNOSIS — N6489 Other specified disorders of breast: Secondary | ICD-10-CM | POA: Diagnosis not present

## 2017-05-25 DIAGNOSIS — N6001 Solitary cyst of right breast: Secondary | ICD-10-CM | POA: Diagnosis not present

## 2017-05-25 DIAGNOSIS — I889 Nonspecific lymphadenitis, unspecified: Secondary | ICD-10-CM | POA: Diagnosis not present

## 2017-05-25 DIAGNOSIS — R590 Localized enlarged lymph nodes: Secondary | ICD-10-CM | POA: Diagnosis not present

## 2017-05-25 HISTORY — PX: BREAST BIOPSY: SHX20

## 2017-05-25 HISTORY — PX: AXILLARY LYMPH NODE BIOPSY: SHX5737

## 2017-05-29 LAB — SURGICAL PATHOLOGY

## 2017-06-01 ENCOUNTER — Other Ambulatory Visit (HOSPITAL_COMMUNITY): Payer: Self-pay | Admitting: Family Medicine

## 2017-06-01 DIAGNOSIS — N6310 Unspecified lump in the right breast, unspecified quadrant: Secondary | ICD-10-CM

## 2017-06-07 ENCOUNTER — Ambulatory Visit (HOSPITAL_COMMUNITY): Admission: RE | Admit: 2017-06-07 | Payer: 59 | Source: Ambulatory Visit

## 2017-06-29 ENCOUNTER — Ambulatory Visit (HOSPITAL_COMMUNITY)
Admission: RE | Admit: 2017-06-29 | Discharge: 2017-06-29 | Disposition: A | Discharge status: Home / Self Care | Payer: BLUE CROSS/BLUE SHIELD | Source: Ambulatory Visit | Attending: Family Medicine | Admitting: Family Medicine

## 2017-06-29 DIAGNOSIS — I889 Nonspecific lymphadenitis, unspecified: Secondary | ICD-10-CM | POA: Insufficient documentation

## 2017-06-29 DIAGNOSIS — N6310 Unspecified lump in the right breast, unspecified quadrant: Secondary | ICD-10-CM | POA: Diagnosis present

## 2017-06-29 DIAGNOSIS — N6489 Other specified disorders of breast: Secondary | ICD-10-CM | POA: Diagnosis not present

## 2017-06-29 LAB — POCT I-STAT CREATININE: Creatinine, Ser: 0.5 mg/dL (ref 0.44–1.00)

## 2017-06-29 MED ORDER — GADOBENATE DIMEGLUMINE 529 MG/ML IV SOLN
20.0000 mL | Freq: Once | INTRAVENOUS | Status: AC | PRN
  Administered 2017-06-29: 18 mL via INTRAVENOUS

## 2017-08-15 ENCOUNTER — Encounter: Payer: Self-pay | Admitting: Emergency Medicine

## 2017-08-15 ENCOUNTER — Telehealth (HOSPITAL_COMMUNITY): Payer: Self-pay

## 2017-08-15 ENCOUNTER — Ambulatory Visit
Admission: EM | Admit: 2017-08-15 | Discharge: 2017-08-15 | Disposition: A | Discharge status: Home / Self Care | Payer: BLUE CROSS/BLUE SHIELD | Attending: Family Medicine | Admitting: Family Medicine

## 2017-08-15 ENCOUNTER — Other Ambulatory Visit: Payer: Self-pay

## 2017-08-15 DIAGNOSIS — N760 Acute vaginitis: Secondary | ICD-10-CM

## 2017-08-15 DIAGNOSIS — B9689 Other specified bacterial agents as the cause of diseases classified elsewhere: Secondary | ICD-10-CM

## 2017-08-15 DIAGNOSIS — Z202 Contact with and (suspected) exposure to infections with a predominantly sexual mode of transmission: Secondary | ICD-10-CM

## 2017-08-15 DIAGNOSIS — R1030 Lower abdominal pain, unspecified: Secondary | ICD-10-CM | POA: Diagnosis not present

## 2017-08-15 LAB — URINALYSIS, COMPLETE (UACMP) WITH MICROSCOPIC
BILIRUBIN URINE: NEGATIVE
Bacteria, UA: NONE SEEN
Glucose, UA: NEGATIVE mg/dL
HGB URINE DIPSTICK: NEGATIVE
Ketones, ur: NEGATIVE mg/dL
LEUKOCYTES UA: NEGATIVE
NITRITE: NEGATIVE
PROTEIN: NEGATIVE mg/dL
RBC / HPF: NONE SEEN RBC/hpf (ref 0–5)
SPECIFIC GRAVITY, URINE: 1.025 (ref 1.005–1.030)
pH: 6.5 (ref 5.0–8.0)

## 2017-08-15 LAB — WET PREP, GENITAL
SPERM: NONE SEEN
Trich, Wet Prep: NONE SEEN
YEAST WET PREP: NONE SEEN

## 2017-08-15 LAB — CHLAMYDIA/NGC RT PCR (ARMC ONLY)
Chlamydia Tr: NOT DETECTED
N gonorrhoeae: NOT DETECTED

## 2017-08-15 MED ORDER — CEFTRIAXONE SODIUM 250 MG IJ SOLR
250.0000 mg | Freq: Once | INTRAMUSCULAR | Status: AC
  Administered 2017-08-15: 250 mg via INTRAMUSCULAR

## 2017-08-15 MED ORDER — METRONIDAZOLE 500 MG PO TABS: 500.0000 mg | ORAL_TABLET | Freq: Two times a day (BID) | ORAL | 0 refills | Status: DC

## 2017-08-15 MED ORDER — AZITHROMYCIN 500 MG PO TABS
1000.0000 mg | ORAL_TABLET | Freq: Once | ORAL | Status: AC
  Administered 2017-08-15: 1000 mg via ORAL

## 2017-08-15 NOTE — ED Triage Notes (Addendum)
Patient in today c/o lower abdominal pain, flank pain and vaginal discharge (white milky-no odor) x 4 days. Patient denies fever. Patient also c/o dysuria x 1 day. Patient is concerned re: STD.

## 2017-08-15 NOTE — ED Provider Notes (Signed)
[facility]  CSN: [id number] Arrival date & time: 08/15/17  0809  History   Chief Complaint Chief Complaint  Patient presents with  . Flank Pain   HPI  41 year old female presents with lower abdominal pain, vaginal discharge, STD exposure.  Patient states that on Thursday she developed white vaginal discharge.  No odor.  She subsequently developed lower abdominal pain and dysuria.  She states that she believes that she has been exposed to an STD.  She states that her symptoms are worsening.  She reports associated nausea.  No fever.  No known exacerbating or relieving factors.  No other associated symptoms.  No other complaints.  Past Medical History:  Diagnosis Date  . Allergy   . Asthma   . Diabetes mellitus without complication ([place])   . Hypertension   . Sleep apnea    Patient Active Problem List   Diagnosis Date Noted  . Iron deficiency anemia 12/30/2015   Past Surgical History:  Procedure Laterality Date  . CESAREAN SECTION     times 3  . TUBAL LIGATION     OB History    Gravida  5   Para  5   Term  5   Preterm      AB      Living  5     SAB      TAB      Ectopic      Multiple      Live Births  5          Home Medications    Prior to Admission medications   Medication Sig Start Date End Date Taking? Authorizing Provider  amLODipine (NORVASC) 5 MG tablet Take 5 mg by mouth daily.    Yes [provider]  ferrous sulfate (EQL SLOW RELEASE IRON) 160 (50 Fe) MG TBCR SR tablet Take 1 tablet (160 mg total) by mouth daily. 12/13/16  Yes Earleen Newport, MD  metFORMIN (GLUCOPHAGE) 500 MG tablet Take 500 mg by mouth daily with breakfast.   Yes [provider]  metroNIDAZOLE (FLAGYL) 500 MG tablet Take 1 tablet (500 mg total) by mouth 2 (two) times daily. 08/15/17   Coral Spikes, DO   Family History Family History  Problem Relation Age of Onset  . Diabetes Mother   . Diabetes Maternal Grandmother   . Cancer  Maternal Grandmother   . Breast cancer Maternal Grandmother 75  . Hypertension Father   . Hyperlipidemia Father    Social History Social History   Tobacco Use  . Smoking status: Never Smoker  . Smokeless tobacco: Never Used  Substance Use Topics  . Alcohol use: No  . Drug use: No   Allergies   Lisinopril  Review of Systems Review of Systems  Constitutional: Negative for fever.  Gastrointestinal: Positive for abdominal pain.  Genitourinary: Positive for dysuria and vaginal discharge.   Physical Exam Triage Vital Signs ED Triage Vitals  Enc Vitals Group     BP 08/15/17 0815 (!) 141/90     Pulse Rate 08/15/17 0815 80     Resp 08/15/17 0815 16     Temp 08/15/17 0815 98.4 F (36.9 C)     Temp Source 08/15/17 0815 Oral     SpO2 08/15/17 0815 100 %     Weight 08/15/17 0815 194 lb (88 kg)     Height 08/15/17 0815 5\' 2"  (1.575 m)     Head Circumference --      Peak Flow --  Pain Score 08/15/17 0814 6     Pain Loc --      Pain Edu? --      Excl. in [place]? --    Updated Vital Signs BP (!) 141/90 (BP Location: Left Arm)   Pulse 80   Temp 98.4 F (36.9 C) (Oral)   Resp 16   Ht 5\' 2"  (1.575 m)   Wt 194 lb (88 kg)   LMP 07/30/2017 Comment: tubaligation  SpO2 100%   BMI 35.48 kg/m   Physical Exam  Constitutional: She is oriented to person, place, and time. She appears well-developed. No distress.  Cardiovascular: Normal rate and regular rhythm.  Pulmonary/Chest: Effort normal and breath sounds normal. She has no wheezes. She has no rales.  Abdominal: Soft.  Nondistended.  Diffuse tenderness in the lower abdomen, particularly the suprapubic region.  Genitourinary:  Genitourinary Comments: Pelvic Exam: External: normal female genitalia without lesions or masses Vagina: Clear to white vaginal discharge noted. Cervix: normal without lesions or masses. Samples for Wet prep, GC/Chlamydia obtained   Neurological: She is alert and oriented to person, place, and time.    Psychiatric: She has a normal mood and affect. Her behavior is normal.  Nursing note and vitals reviewed.  UC Treatments / Results  Labs (all labs ordered are listed, but only abnormal results are displayed) Labs Reviewed  WET PREP, GENITAL - Abnormal; Notable for the following components:      Result Value   Clue Cells Wet Prep HPF POC PRESENT (*)    WBC, Wet Prep HPF POC FEW (*)    All other components within normal limits  CHLAMYDIA/NGC RT PCR ([facility] ONLY)  URINALYSIS, COMPLETE (UACMP) WITH MICROSCOPIC    EKG None  Radiology No results found.  Procedures Procedures (including critical care time)  Medications Ordered in UC Medications  azithromycin (ZITHROMAX) tablet 1,000 mg (1,000 mg Oral Given 08/15/17 0919)  cefTRIAXone (ROCEPHIN) injection 250 mg (250 mg Intramuscular Given 08/15/17 0917)    Initial Impression / Assessment and Plan / UC Course  I have reviewed the triage vital signs and the nursing notes.  Pertinent labs & imaging results that were available during my care of the patient were reviewed by me and considered in my medical decision making (see chart for details).    42 year old female presents with lower abdominal pain, dysuria, vaginal discharge, and STD exposure.  Urinalysis unremarkable.  Wet prep revealed clue cells consistent with bacterial vaginosis.  Treating with Flagyl.  Patient concerned about her symptoms and the fact that she is worsening.  She believes that she is been exposed to STD.  She desires empiric treatment today.  Treating with azithromycin and Rocephin.  Final Clinical Impressions(s) / UC Diagnoses   Final diagnoses:  Lower abdominal pain  STD exposure  Bacterial vaginosis     Discharge Instructions     Meds as prescribed.  Take care  Dr. Lacinda Axon     ED Prescriptions    Medication Sig Dispense Auth. Provider   metroNIDAZOLE (FLAGYL) 500 MG tablet Take 1 tablet (500 mg total) by mouth 2 (two) times daily. 14  tablet Coral Spikes, DO     Controlled Substance Prescriptions [place] Controlled Substance Registry consulted? Not Applicable   Coral Spikes, DO 08/15/17 9357

## 2017-08-15 NOTE — Telephone Encounter (Signed)
Results are within normal range. Pt contacted and made aware. Verbalized understanding.   

## 2017-08-15 NOTE — Discharge Instructions (Signed)
Meds as prescribed. ° °Take care ° °Dr. Miyo Aina  °

## 2017-08-22 ENCOUNTER — Encounter: Payer: Self-pay | Admitting: *Deleted

## 2017-09-11 ENCOUNTER — Encounter: Payer: Self-pay | Admitting: Obstetrics and Gynecology

## 2017-09-11 ENCOUNTER — Ambulatory Visit (INDEPENDENT_AMBULATORY_CARE_PROVIDER_SITE_OTHER): Payer: BLUE CROSS/BLUE SHIELD | Admitting: Obstetrics and Gynecology

## 2017-09-11 VITALS — BP 120/77 | HR 78 | Ht 62.0 in | Wt 195.6 lb

## 2017-09-11 DIAGNOSIS — Z0000 Encounter for general adult medical examination without abnormal findings: Secondary | ICD-10-CM | POA: Diagnosis not present

## 2017-09-11 NOTE — Addendum Note (Signed)
Addended by: Elouise Munroe on: 09/11/2017 08:58 AM   Modules accepted: Orders

## 2017-09-11 NOTE — Progress Notes (Signed)
HPI:      [name] is a 42 y.o. [id number] who LMP was Patient's last menstrual period was 08/25/2017 (exact date).  Subjective:   She presents today for her annual examination.  She has no specific complaints.  She states that her periods remain very heavy and lasting 7 days but she does not want to do anything about them at this time.  She reports that the spotting after intercourse has resolved. Within this last year she had a right breast biopsy which was negative-a clip was placed and she is followed by Dr. Tollie Pizza. She states that she was previously diagnosed with fibroids but her most recent ultrasound did not show them. Patient does have a new sexual partner within the last year.     Hx: The following portions of the patient's history were reviewed and updated as appropriate:             She  has a past medical history of Allergy, Asthma, Diabetes mellitus without complication ([place]), Hypertension, and Sleep apnea. She does not have any pertinent problems on file. She  has a past surgical history that includes Cesarean section; Tubal ligation; and Breast biopsy (Right). Her family history includes Breast cancer (age of onset: 26) in her maternal grandmother; Cancer in her maternal grandmother; Diabetes in her maternal grandmother and mother; Hyperlipidemia in her father; Hypertension in her father. She  reports that she has never smoked. She has never used smokeless tobacco. She reports that she does not drink alcohol or use drugs. She has a current medication list which includes the following prescription(s): albuterol, amlodipine, ferrous sulfate, and metformin. She is allergic to lisinopril.       Review of Systems:  Review of Systems  Constitutional: Denied constitutional symptoms, night sweats, recent illness, fatigue, fever, insomnia and weight loss.  Eyes: Denied eye symptoms, eye pain, photophobia, vision change and visual disturbance.  Ears/Nose/Throat/Neck:  Denied ear, nose, throat or neck symptoms, hearing loss, nasal discharge, sinus congestion and sore throat.  Cardiovascular: Denied cardiovascular symptoms, arrhythmia, chest pain/pressure, edema, exercise intolerance, orthopnea and palpitations.  Respiratory: Denied pulmonary symptoms, asthma, pleuritic pain, productive sputum, cough, dyspnea and wheezing.  Gastrointestinal: Denied, gastro-esophageal reflux, melena, nausea and vomiting.  Genitourinary: Denied genitourinary symptoms including symptomatic vaginal discharge, pelvic relaxation issues, and urinary complaints.  Musculoskeletal: Denied musculoskeletal symptoms, stiffness, swelling, muscle weakness and myalgia.  Dermatologic: Denied dermatology symptoms, rash and scar.  Neurologic: Denied neurology symptoms, dizziness, headache, neck pain and syncope.  Psychiatric: Denied psychiatric symptoms, anxiety and depression.  Endocrine: Denied endocrine symptoms including hot flashes and night sweats.   Meds:   Current Outpatient Medications on File Prior to Visit  Medication Sig Dispense Refill  . albuterol (PROVENTIL) (2.5 MG/3ML) 0.083% nebulizer solution Take 2.5 mg by nebulization every 6 (six) hours as needed for wheezing or shortness of breath.    Marland Kitchen amLODipine (NORVASC) 5 MG tablet Take 5 mg by mouth daily.   10  . ferrous sulfate (EQL SLOW RELEASE IRON) 160 (50 Fe) MG TBCR SR tablet Take 1 tablet (160 mg total) by mouth daily. 30 each 6  . metFORMIN (GLUCOPHAGE) 500 MG tablet Take 500 mg by mouth daily with breakfast.     No current facility-administered medications on file prior to visit.     Objective:     Vitals:   09/11/17 0806  BP: 120/77  Pulse: 78              Physical examination  General NAD, Conversant  HEENT Atraumatic; Op clear with mmm.  Normo-cephalic. Pupils reactive. Anicteric sclerae  Thyroid/Neck Smooth without nodularity or enlargement. Normal ROM.  Neck Supple.  Skin No rashes, lesions or ulceration.  Normal palpated skin turgor. No nodularity.  Breasts:  Deferred to Dr. Tollie Pizza  Lungs: Clear to auscultation.No rales or wheezes. Normal Respiratory effort, no retractions.  Heart: NSR.  No murmurs or rubs appreciated. No periferal edema  Abdomen: Soft.  Non-tender.  No masses.  No HSM. No hernia  Extremities: Moves all appropriately.  Normal ROM for age. No lymphadenopathy.  Neuro: Oriented to PPT.  Normal mood. Normal affect.     Pelvic:   Vulva: Normal appearance.  No lesions.  Vagina: No lesions or abnormalities noted.  Support: Normal pelvic support.  Urethra No masses tenderness or scarring.  Meatus Normal size without lesions or prolapse.  Cervix: Normal appearance.  No lesions.  Anus: Normal exam.  No lesions.  Perineum: Normal exam.  No lesions.        Bimanual   Uterus: 14 wks size  Non-tender.  Mobile.  AV.  Adnexae: No masses.  Non-tender to palpation.  Cul-de-sac: Negative for abnormality.      Assessment:    [id number] Patient Active Problem List   Diagnosis Date Noted  . Iron deficiency anemia 12/30/2015     1. Encounter for annual physical exam        Plan:            1.  Basic Screening Recommendations The basic screening recommendations for asymptomatic women were discussed with the patient during her visit.  The age-appropriate recommendations were discussed with her and the rational for the tests reviewed.  When I am informed by the patient that another primary care physician has previously obtained the age-appropriate tests and they are up-to-date, only outstanding tests are ordered and referrals given as necessary.  Abnormal results of tests will be discussed with her when all of her results are completed. 2.  Pap performed 3.  Patient to be followed by Dr. Tollie Pizza for breast care. Orders No orders of the defined types were placed in this encounter.   No orders of the defined types were placed in this encounter.       F/U  Return in about 1 year  (around 09/12/2018) for Annual Physical.  Finis Bud, M.D. 09/11/2017 8:39 AM

## 2017-09-14 LAB — IGP, COBASHPV16/18
HPV 16: NEGATIVE
HPV 18: NEGATIVE
HPV OTHER HR TYPES: NEGATIVE
PAP Smear Comment: 0

## 2017-09-26 ENCOUNTER — Ambulatory Visit: Payer: Self-pay | Admitting: General Surgery

## 2017-10-26 ENCOUNTER — Encounter: Payer: Self-pay | Admitting: *Deleted

## 2017-10-26 ENCOUNTER — Ambulatory Visit (INDEPENDENT_AMBULATORY_CARE_PROVIDER_SITE_OTHER): Payer: BLUE CROSS/BLUE SHIELD | Admitting: General Surgery

## 2017-10-26 ENCOUNTER — Ambulatory Visit: Payer: Self-pay

## 2017-10-26 VITALS — BP 130/72 | HR 78 | Resp 14 | Ht 62.0 in | Wt 197.0 lb

## 2017-10-26 DIAGNOSIS — D649 Anemia, unspecified: Secondary | ICD-10-CM

## 2017-10-26 DIAGNOSIS — M7989 Other specified soft tissue disorders: Secondary | ICD-10-CM

## 2017-10-26 DIAGNOSIS — R590 Localized enlarged lymph nodes: Secondary | ICD-10-CM | POA: Diagnosis not present

## 2017-10-26 NOTE — Patient Instructions (Addendum)
Follow up appointment to be announced. The patient is aware to call back for any questions or concerns. 

## 2017-10-26 NOTE — Progress Notes (Signed)
Patient ID: Donna Reyes, female   DOB: Aug 30, 1975, 42 y.o.   MRN: [id number]  Chief Complaint  Patient presents with  . Other    HPI Donna Reyes is a 42 y.o. female who presents for a breast evaluation. The most recent mammogram was done on  05/18/2017 added views and right breast biopsy  on 05/25/2017 . MRI done on 06/29/2017. Patient did have some soreness in her right breast and right axillary area at the begin of February. She states her breast was red and swollen.  This subsequently resolved.  Over the last week her right armpit has some soreness, not as severe as noted in February.  Patient does perform regular self breast checks and gets regular mammograms done.    No history of nipple/oral contact.  No history of trauma to the breast.  No history of skin breaks involving the right upper extremity.  HPI  Past Medical History:  Diagnosis Date  . Allergy   . Anemia   . Asthma   . Diabetes mellitus without complication ([place])   . Hypertension   . Sleep apnea     Past Surgical History:  Procedure Laterality Date  . AXILLARY LYMPH NODE BIOPSY Right 05/25/2017   LYMPHADENITIS. NEGATIVE FOR MALIGNANCY.   Marland Kitchen BREAST BIOPSY Right 05/25/2017   neg/NEGATIVE FOR CARCINOMA. INFLAMMATION AND SQUAMOUS METAPLASIA OF A FOCAL DUCT   . CESAREAN SECTION     times 3  . TUBAL LIGATION      Family History  Problem Relation Age of Onset  . Diabetes Mother   . Diabetes Maternal Grandmother   . Cancer Maternal Grandmother   . Breast cancer Maternal Grandmother 33  . Hypertension Father   . Hyperlipidemia Father   . Ovarian cancer Neg Hx   . Colon cancer Neg Hx     Social History Social History   Tobacco Use  . Smoking status: Never Smoker  . Smokeless tobacco: Never Used  Substance Use Topics  . Alcohol use: No  . Drug use: No    Allergies  Allergen Reactions  . Lisinopril     Current Outpatient Medications  Medication Sig Dispense Refill  . albuterol  (PROVENTIL) (2.5 MG/3ML) 0.083% nebulizer solution Take 2.5 mg by nebulization every 6 (six) hours as needed for wheezing or shortness of breath.    Marland Kitchen amLODipine (NORVASC) 5 MG tablet Take 5 mg by mouth daily.   10  . ferrous sulfate (EQL SLOW RELEASE IRON) 160 (50 Fe) MG TBCR SR tablet Take 1 tablet (160 mg total) by mouth daily. 30 each 6  . metFORMIN (GLUCOPHAGE) 500 MG tablet Take 500 mg by mouth daily with breakfast.     No current facility-administered medications for this visit.     Review of Systems Review of Systems  Constitutional: Negative.   Respiratory: Negative.   Cardiovascular: Negative.   Genitourinary:       Very heavy menses for 2 days, then lighter flow x3.    Blood pressure 130/72, pulse 78, resp. rate 14, height '5\' 2"'  (1.575 m), weight 197 lb (89.4 kg).  Physical Exam Physical Exam  Constitutional: She is oriented to person, place, and time. She appears well-developed and well-nourished.  Eyes: Conjunctivae are normal. No scleral icterus.  Neck: Neck supple.  Cardiovascular: Normal rate, regular rhythm and normal heart sounds.  Pulmonary/Chest: Effort normal and breath sounds normal. Right breast exhibits no inverted nipple, no mass, no nipple discharge, no skin change and no tenderness. Left breast  exhibits no inverted nipple, no mass, no nipple discharge, no skin change and no tenderness. Breasts are asymmetrical (left breast 1 cup size bigger than right breast.).    Abdominal: There is no hepatosplenomegaly.  Lymphadenopathy:    She has no cervical adenopathy.    She has no axillary adenopathy.       Right: No inguinal and no supraclavicular adenopathy present.       Left: No inguinal and no supraclavicular adenopathy present.  Neurological: She is alert and oriented to person, place, and time.  Skin: Skin is warm and dry.    Data Reviewed May 25, 2017 biopsy results: DIAGNOSIS:  A. RIGHT BREAST; ULTRASOUND-GUIDED CORE BIOPSY:  - NEGATIVE FOR  CARCINOMA.  - INFLAMMATION AND SQUAMOUS METAPLASIA OF A FOCAL DUCT WITH FEATURES  SUGGESTIVE OF RUPTURE.  - DEEPER LEVELS WERE EXAMINED.   B. RIGHT AXILLA; ULTRASOUND-GUIDED CORE BIOPSY:  - LYMPHADENITIS.  - NEGATIVE FOR MALIGNANCY.  Bilateral breast MRI dated June 29, 2017 showed only right axillary adenopathy.  No primary breast pathology.  Ultrasound examination of the right breast and axilla was completed.  There are 2 dominant lymph nodes in the axilla measuring 1.2 x 2.3 x 2.5 cm and 0.9 x 0.28 x 0.49 cm.  In the right breast at the 12 o'clock position, 4 cm from the nipple at the site of her original biopsy a softly lobulated hypoechoic mass without posterior acoustic shadowing measuring 0.47 x 0.47 x 0.72 cm is noted.  At the time of the patient's biopsy in February 2019 this area measured 5 x 6 x 7 cm.  BI-RADS-3  September 11, 2017 GYN notes with Donna Fend, MD reviewed.  At that time the patient had reported that she had no desire to consider surgical options for control of her heavy menses.  Assessment    Right axillary adenopathy without clear etiology.  Likely iron deficiency anemia based on menses.    Plan   Follow up appointment to be announced based bar pending review of her upcoming laboratory studies to include a CBC, ESR, C-reactive protein, iron and iron binding studies and comprehensive metabolic panel.. The patient is aware to call back for any questions or concerns.  The patient had been seen by Donna Hoh, MD from hematology in the past.  Intravenous iron have been recommended, but I do not see any clear records that it was administered.  HPI, Physical Exam, Assessment and Plan have been scribed under the direction and in the presence of Donna Ard, MD.  Donna Reyes, CMA  I have completed the exam and reviewed the above documentation for accuracy and completeness.  I agree with the above.  Haematologist has been used and any errors in  dictation or transcription are unintentional.  Donna Reyes, M.D., F.A.C.S.  Donna Reyes 10/27/2017, 5:46 PM

## 2017-10-27 DIAGNOSIS — D649 Anemia, unspecified: Secondary | ICD-10-CM | POA: Insufficient documentation

## 2017-10-27 DIAGNOSIS — R590 Localized enlarged lymph nodes: Secondary | ICD-10-CM | POA: Insufficient documentation

## 2017-11-03 ENCOUNTER — Encounter: Payer: Self-pay | Admitting: General Surgery

## 2017-11-06 NOTE — Progress Notes (Signed)
[facility]  [phone] [phone]) [phone]  ID: [name] OB: [birth year]  MR#: [id number]  JKD#:[id number]  Patient Care Team: Karen Kitchens, MD as PCP - General (Family Medicine)  CHIEF COMPLAINT: Iron deficiency anemia  INTERVAL HISTORY: Patient last evaluated in December 2017.  She has noticed increased weakness and fatigue recently and was found to have significant drop in her hemoglobin and iron stores.  She otherwise feels well.  She has no neurologic complaints. She denies any recent fevers or illnesses. She has a good appetite and denies weight loss. She denies any chest pain or shortness of breath. She denies any nausea, vomiting, constipation, or diarrhea. She has no melena or hematochezia. She has no urinary complaints.  Patient offers no further specific complaints today.  REVIEW OF SYSTEMS:   Review of Systems  Constitutional: Positive for malaise/fatigue. Negative for fever and weight loss.  Respiratory: Negative.  Negative for cough and shortness of breath.   Cardiovascular: Negative.  Negative for chest pain and leg swelling.  Gastrointestinal: Negative.  Negative for abdominal pain, blood in stool and melena.  Genitourinary: Negative.  Negative for hematuria.  Musculoskeletal: Negative.  Negative for myalgias.  Skin: Negative.  Negative for rash.  Neurological: Positive for weakness. Negative for focal weakness and headaches.  Psychiatric/Behavioral: Negative.  The patient is not nervous/anxious.     As per HPI. Otherwise, a complete review of systems is negative.  PAST MEDICAL HISTORY: Past Medical History:  Diagnosis Date  . Allergy   . Anemia   . Asthma   . Diabetes mellitus without complication ([place])   . Hypertension   . Sleep apnea     PAST SURGICAL HISTORY: Past Surgical History:  Procedure Laterality Date  . AXILLARY LYMPH NODE BIOPSY Right 05/25/2017   LYMPHADENITIS. NEGATIVE FOR MALIGNANCY.   Marland Kitchen  BREAST BIOPSY Right 05/25/2017   neg/NEGATIVE FOR CARCINOMA. INFLAMMATION AND SQUAMOUS METAPLASIA OF A FOCAL DUCT   . CESAREAN SECTION     times 3  . TUBAL LIGATION      FAMILY HISTORY: Reviewed and unchanged. No reported history of malignancy or chronic disease.  ADVANCED DIRECTIVES (Y/N):  N  HEALTH MAINTENANCE: Social History   Tobacco Use  . Smoking status: Never Smoker  . Smokeless tobacco: Never Used  Substance Use Topics  . Alcohol use: No  . Drug use: No     Colonoscopy:  PAP:  Bone density:  Lipid panel:  Allergies  Allergen Reactions  . Lisinopril     Current Outpatient Medications  Medication Sig Dispense Refill  . albuterol (PROVENTIL) (2.5 MG/3ML) 0.083% nebulizer solution Take 2.5 mg by nebulization every 6 (six) hours as needed for wheezing or shortness of breath.    Marland Kitchen amLODipine (NORVASC) 5 MG tablet Take 5 mg by mouth daily.   10  . ferrous sulfate (EQL SLOW RELEASE IRON) 160 (50 Fe) MG TBCR SR tablet Take 1 tablet (160 mg total) by mouth daily. 30 each 6  . metFORMIN (GLUCOPHAGE) 500 MG tablet Take 500 mg by mouth daily with breakfast.    . Spacer/Aero-Holding Chambers (AEROCHAMBER W/FLOWSIGNAL) inhaler Use as directed. Use as instructed.     No current facility-administered medications for this visit.    Facility-Administered Medications Ordered in Other Visits  Medication Dose Route Frequency Provider Last Rate Last Dose  . ferumoxytol (FERAHEME) 510 mg in sodium chloride 0.9 % 100 mL IVPB  510 mg Intravenous Once Lloyd Huger, MD   Stopped at 11/10/17  1033    OBJECTIVE: Vitals:   11/10/17 0931  BP: 128/85  Pulse: 83  Resp: 18  Temp: (!) 97.2 F (36.2 C)     Body mass index is 35.6 kg/m.    ECOG FS:0 - Asymptomatic  General: Well-developed, well-nourished, no acute distress. Eyes: Pink conjunctiva, anicteric sclera. HEENT: Normocephalic, moist mucous membranes, clear oropharnyx. Lungs: Clear to auscultation bilaterally. Heart:  Regular rate and rhythm. No rubs, murmurs, or gallops. Abdomen: Soft, nontender, nondistended. No organomegaly noted, normoactive bowel sounds. Musculoskeletal: No edema, cyanosis, or clubbing. Neuro: Alert, answering all questions appropriately. Cranial nerves grossly intact. Skin: No rashes or petechiae noted. Psych: Normal affect.   LAB RESULTS:  Lab Results  Component Value Date   NA 137 12/13/2016   K 3.9 12/13/2016   CL 106 12/13/2016   CO2 25 12/13/2016   GLUCOSE 103 (H) 12/13/2016   BUN 11 12/13/2016   CREATININE 0.50 06/29/2017   CALCIUM 8.6 (L) 12/13/2016   PROT 7.3 12/13/2016   ALBUMIN 3.8 12/13/2016   AST 14 (L) 12/13/2016   ALT 11 (L) 12/13/2016   ALKPHOS 45 12/13/2016   BILITOT 0.5 12/13/2016   GFRNONAA >60 12/13/2016   GFRAA >60 12/13/2016    Lab Results  Component Value Date   WBC 5.5 12/13/2016   NEUTROABS 2.3 05/06/2014   HGB 9.2 (L) 12/13/2016   HCT 30.0 (L) 12/13/2016   MCV 66.3 (L) 12/13/2016   PLT 365 12/13/2016   Lab Results  Component Value Date   IRON 11 (L) 01/01/2016   TIBC 499 (H) 01/01/2016   IRONPCTSAT 2 (L) 01/01/2016   Lab Results  Component Value Date   FERRITIN 4 (L) 01/01/2016     STUDIES: US Breast Complete Uni Right Inc Axilla  Result Date: 10/27/2017 Ultrasound examination of the right breast and axilla was completed.  There are 2 dominant lymph nodes in the axilla measuring 1.2 x 2.3 x 2.5 cm and 0.9 x 0.28 x 0.49 cm.  In the right breast at the 12 o'clock position, 4 cm from the nipple at the site of her original biopsy a softly lobulated hypoechoic mass without posterior acoustic shadowing measuring 0.47 x 0.47 x 0.72 cm is noted.  At the time of the patient's biopsy in February 2019 this area measured 5 x 6 x 7 cm.  BI-RADS-3    ASSESSMENT: Iron deficiency anemia  PLAN:    1. Iron deficiency anemia: Likely secondary to continued heavy menses.  Patient reports she cannot tolerate oral iron supplementation.  Her  most recent hemoglobin was reported at 8.1 with a total iron of 13, ferritin, and iron saturation of 3%.  MCV was also significantly low at 63.  Proceed with 510 mg IV Feraheme today.  Return to clinic in 1 week for second infusion and then in 2 months with repeat laboratory work and further evaluation. 2.  Low MCV: Will include hemoglobinopathy profile with next lab draw.  3.  Thrombocytosis: Patient's platelet count was greater than 600.  Likely secondary to iron deficiency anemia.  IV Feraheme as above.  Patient expressed understanding and was in agreement with this plan. She also understands that She can call clinic at any time with any questions, concerns, or complaints.    Lloyd Huger, MD 11/10/17 10:29 AM

## 2017-11-10 ENCOUNTER — Other Ambulatory Visit: Payer: Self-pay

## 2017-11-10 ENCOUNTER — Encounter: Payer: Self-pay | Admitting: Oncology

## 2017-11-10 ENCOUNTER — Inpatient Hospital Stay: Payer: BLUE CROSS/BLUE SHIELD | Attending: Oncology | Admitting: Oncology

## 2017-11-10 ENCOUNTER — Inpatient Hospital Stay: Payer: BLUE CROSS/BLUE SHIELD

## 2017-11-10 VITALS — BP 128/85 | HR 83 | Temp 97.2°F | Resp 18 | Wt 194.7 lb

## 2017-11-10 DIAGNOSIS — Z7984 Long term (current) use of oral hypoglycemic drugs: Secondary | ICD-10-CM | POA: Insufficient documentation

## 2017-11-10 DIAGNOSIS — G4730 Sleep apnea, unspecified: Secondary | ICD-10-CM | POA: Diagnosis not present

## 2017-11-10 DIAGNOSIS — R531 Weakness: Secondary | ICD-10-CM | POA: Diagnosis not present

## 2017-11-10 DIAGNOSIS — D509 Iron deficiency anemia, unspecified: Secondary | ICD-10-CM | POA: Insufficient documentation

## 2017-11-10 DIAGNOSIS — R5383 Other fatigue: Secondary | ICD-10-CM | POA: Insufficient documentation

## 2017-11-10 DIAGNOSIS — D500 Iron deficiency anemia secondary to blood loss (chronic): Secondary | ICD-10-CM

## 2017-11-10 DIAGNOSIS — D473 Essential (hemorrhagic) thrombocythemia: Secondary | ICD-10-CM | POA: Insufficient documentation

## 2017-11-10 DIAGNOSIS — R5381 Other malaise: Secondary | ICD-10-CM | POA: Insufficient documentation

## 2017-11-10 DIAGNOSIS — E119 Type 2 diabetes mellitus without complications: Secondary | ICD-10-CM | POA: Insufficient documentation

## 2017-11-10 DIAGNOSIS — I10 Essential (primary) hypertension: Secondary | ICD-10-CM | POA: Insufficient documentation

## 2017-11-10 DIAGNOSIS — N920 Excessive and frequent menstruation with regular cycle: Secondary | ICD-10-CM | POA: Diagnosis not present

## 2017-11-10 DIAGNOSIS — D508 Other iron deficiency anemias: Secondary | ICD-10-CM

## 2017-11-10 DIAGNOSIS — Z79899 Other long term (current) drug therapy: Secondary | ICD-10-CM | POA: Insufficient documentation

## 2017-11-10 MED ORDER — SODIUM CHLORIDE 0.9 % IV SOLN
Freq: Once | INTRAVENOUS | Status: AC
Start: 2017-11-10 — End: 2017-11-10
  Administered 2017-11-10: 10:00:00 via INTRAVENOUS
  Filled 2017-11-10: qty 1000

## 2017-11-10 MED ORDER — SODIUM CHLORIDE 0.9 % IV SOLN
510.0000 mg | Freq: Once | INTRAVENOUS | Status: AC
  Administered 2017-11-10: 510 mg via INTRAVENOUS
  Filled 2017-11-10: qty 17

## 2017-11-10 NOTE — Progress Notes (Signed)
Patient here for follow up. No complaints voiced.

## 2017-11-17 ENCOUNTER — Inpatient Hospital Stay: Payer: BLUE CROSS/BLUE SHIELD

## 2017-11-17 VITALS — BP 123/76 | HR 80 | Temp 97.1°F | Resp 18

## 2017-11-17 DIAGNOSIS — E119 Type 2 diabetes mellitus without complications: Secondary | ICD-10-CM | POA: Diagnosis not present

## 2017-11-17 DIAGNOSIS — N920 Excessive and frequent menstruation with regular cycle: Secondary | ICD-10-CM | POA: Diagnosis not present

## 2017-11-17 DIAGNOSIS — I10 Essential (primary) hypertension: Secondary | ICD-10-CM | POA: Diagnosis not present

## 2017-11-17 DIAGNOSIS — R531 Weakness: Secondary | ICD-10-CM | POA: Diagnosis not present

## 2017-11-17 DIAGNOSIS — R5383 Other fatigue: Secondary | ICD-10-CM | POA: Diagnosis not present

## 2017-11-17 DIAGNOSIS — Z79899 Other long term (current) drug therapy: Secondary | ICD-10-CM | POA: Diagnosis not present

## 2017-11-17 DIAGNOSIS — Z7984 Long term (current) use of oral hypoglycemic drugs: Secondary | ICD-10-CM | POA: Diagnosis not present

## 2017-11-17 DIAGNOSIS — D509 Iron deficiency anemia, unspecified: Secondary | ICD-10-CM | POA: Diagnosis not present

## 2017-11-17 DIAGNOSIS — D508 Other iron deficiency anemias: Secondary | ICD-10-CM

## 2017-11-17 DIAGNOSIS — R5381 Other malaise: Secondary | ICD-10-CM | POA: Diagnosis not present

## 2017-11-17 DIAGNOSIS — G4730 Sleep apnea, unspecified: Secondary | ICD-10-CM | POA: Diagnosis not present

## 2017-11-17 DIAGNOSIS — D473 Essential (hemorrhagic) thrombocythemia: Secondary | ICD-10-CM | POA: Diagnosis not present

## 2017-11-17 MED ORDER — FERUMOXYTOL INJECTION 510 MG/17 ML
510.0000 mg | Freq: Once | INTRAVENOUS | Status: AC
  Administered 2017-11-17: 510 mg via INTRAVENOUS
  Filled 2017-11-17: qty 17

## 2017-11-17 MED ORDER — SODIUM CHLORIDE 0.9 % IV SOLN
Freq: Once | INTRAVENOUS | Status: AC
Start: 2017-11-17 — End: 2017-11-17
  Administered 2017-11-17: 09:00:00 via INTRAVENOUS
  Filled 2017-11-17: qty 1000

## 2017-12-22 DIAGNOSIS — H5213 Myopia, bilateral: Secondary | ICD-10-CM | POA: Diagnosis not present

## 2018-01-01 NOTE — Progress Notes (Signed)
[facility]  [phone]) [phone] Fax:(336) [phone]  ID: [name] OB: Nov 01, 1975  MR#: [id number]  AOZ#:[id number]  Patient Care Team: Karen Kitchens, MD as PCP - General (Family Medicine)  CHIEF COMPLAINT: Iron deficiency anemia  INTERVAL HISTORY: Patient returns to clinic today for repeat laboratory work and further evaluation.  She currently feels well and is asymptomatic.  She does not complain of weakness or fatigue today.  She has no neurologic complaints. She denies any recent fevers or illnesses. She has a good appetite and denies weight loss. She denies any chest pain or shortness of breath. She denies any nausea, vomiting, constipation, or diarrhea. She has no melena or hematochezia. She has no urinary complaints.  Patient feels at her baseline offers no specific complaints today.  REVIEW OF SYSTEMS:   Review of Systems  Constitutional: Negative.  Negative for fever, malaise/fatigue and weight loss.  Respiratory: Negative.  Negative for cough and shortness of breath.   Cardiovascular: Negative.  Negative for chest pain and leg swelling.  Gastrointestinal: Negative.  Negative for abdominal pain, blood in stool and melena.  Genitourinary: Negative.  Negative for hematuria.  Musculoskeletal: Negative.  Negative for myalgias.  Skin: Negative.  Negative for rash.  Neurological: Negative.  Negative for focal weakness, weakness and headaches.  Psychiatric/Behavioral: Negative.  The patient is not nervous/anxious.     As per HPI. Otherwise, a complete review of systems is negative.  PAST MEDICAL HISTORY: Past Medical History:  Diagnosis Date  . Allergy   . Anemia   . Asthma   . Diabetes mellitus without complication ([place])   . Hypertension   . Sleep apnea     PAST SURGICAL HISTORY: Past Surgical History:  Procedure Laterality Date  . AXILLARY LYMPH NODE BIOPSY Right 05/25/2017   LYMPHADENITIS. NEGATIVE FOR MALIGNANCY.   Marland Kitchen BREAST  BIOPSY Right 05/25/2017   neg/NEGATIVE FOR CARCINOMA. INFLAMMATION AND SQUAMOUS METAPLASIA OF A FOCAL DUCT   . CESAREAN SECTION     times 3  . TUBAL LIGATION      FAMILY HISTORY: Reviewed and unchanged. No reported history of malignancy or chronic disease.  ADVANCED DIRECTIVES (Y/N):  N  HEALTH MAINTENANCE: Social History   Tobacco Use  . Smoking status: Never Smoker  . Smokeless tobacco: Never Used  Substance Use Topics  . Alcohol use: No  . Drug use: No     Colonoscopy:  PAP:  Bone density:  Lipid panel:  Allergies  Allergen Reactions  . Lisinopril     Current Outpatient Medications  Medication Sig Dispense Refill  . albuterol (PROVENTIL) (2.5 MG/3ML) 0.083% nebulizer solution Take 2.5 mg by nebulization every 6 (six) hours as needed for wheezing or shortness of breath.    Marland Kitchen amLODipine (NORVASC) 5 MG tablet Take 5 mg by mouth daily.   10  . ferrous sulfate (EQL SLOW RELEASE IRON) 160 (50 Fe) MG TBCR SR tablet Take 1 tablet (160 mg total) by mouth daily. 30 each 6  . metFORMIN (GLUCOPHAGE) 500 MG tablet Take 500 mg by mouth daily with breakfast.    . Spacer/Aero-Holding Chambers (AEROCHAMBER W/FLOWSIGNAL) inhaler Use as directed. Use as instructed.     No current facility-administered medications for this visit.     OBJECTIVE: Vitals:   01/05/18 0839  BP: (!) 141/87  Pulse: 74  Resp: 18  Temp: (!) 97 F (36.1 C)     Body mass index is 36.25 kg/m.    ECOG FS:0 - Asymptomatic  General: Well-developed,  well-nourished, no acute distress. Eyes: Pink conjunctiva, anicteric sclera. HEENT: Normocephalic, moist mucous membranes. Lungs: Clear to auscultation bilaterally. Heart: Regular rate and rhythm. No rubs, murmurs, or gallops. Abdomen: Soft, nontender, nondistended. No organomegaly noted, normoactive bowel sounds. Musculoskeletal: No edema, cyanosis, or clubbing. Neuro: Alert, answering all questions appropriately. Cranial nerves grossly intact. Skin: No  rashes or petechiae noted. Psych: Normal affect.   LAB RESULTS:  Lab Results  Component Value Date   NA 137 12/13/2016   K 3.9 12/13/2016   CL 106 12/13/2016   CO2 25 12/13/2016   GLUCOSE 103 (H) 12/13/2016   BUN 11 12/13/2016   CREATININE 0.50 06/29/2017   CALCIUM 8.6 (L) 12/13/2016   PROT 7.3 12/13/2016   ALBUMIN 3.8 12/13/2016   AST 14 (L) 12/13/2016   ALT 11 (L) 12/13/2016   ALKPHOS 45 12/13/2016   BILITOT 0.5 12/13/2016   GFRNONAA >60 12/13/2016   GFRAA >60 12/13/2016    Lab Results  Component Value Date   WBC 5.7 01/04/2018   NEUTROABS 3.2 01/04/2018   HGB 12.5 01/04/2018   HCT 38.7 01/04/2018   MCV 77.5 (L) 01/04/2018   PLT 361 01/04/2018   Lab Results  Component Value Date   IRON 30 01/04/2018   TIBC 374 01/04/2018   IRONPCTSAT 8 (L) 01/04/2018   Lab Results  Component Value Date   FERRITIN 16 01/04/2018     STUDIES: No results found.  ASSESSMENT: Iron deficiency anemia  PLAN:    1. Iron deficiency anemia: Likely secondary to continued heavy menses.  Patient reports she cannot tolerate oral iron supplementation.  Patient's hemoglobin has significantly improved and is now within normal limits.  She continues to have a decreased iron saturation at 8%.  After discussion with the patient, she agreed to receive 1 additional infusion of 510 mg IV Feraheme today.  Return to clinic in 3 months with repeat laboratory work and further evaluation.   2.  Low MCV: Improving.  Hemoglobinopathy profile within normal limits.  3.  Thrombocytosis: Resolved.  Likely secondary to iron deficiency.  Patient expressed understanding and was in agreement with this plan. She also understands that She can call clinic at any time with any questions, concerns, or complaints.    Lloyd Huger, MD 01/07/18 9:29 AM

## 2018-01-04 ENCOUNTER — Inpatient Hospital Stay: Payer: BLUE CROSS/BLUE SHIELD | Attending: Oncology

## 2018-01-04 DIAGNOSIS — G4730 Sleep apnea, unspecified: Secondary | ICD-10-CM | POA: Insufficient documentation

## 2018-01-04 DIAGNOSIS — I10 Essential (primary) hypertension: Secondary | ICD-10-CM | POA: Diagnosis not present

## 2018-01-04 DIAGNOSIS — E119 Type 2 diabetes mellitus without complications: Secondary | ICD-10-CM | POA: Insufficient documentation

## 2018-01-04 DIAGNOSIS — D500 Iron deficiency anemia secondary to blood loss (chronic): Secondary | ICD-10-CM

## 2018-01-04 DIAGNOSIS — N920 Excessive and frequent menstruation with regular cycle: Secondary | ICD-10-CM | POA: Insufficient documentation

## 2018-01-04 DIAGNOSIS — J45909 Unspecified asthma, uncomplicated: Secondary | ICD-10-CM | POA: Insufficient documentation

## 2018-01-04 DIAGNOSIS — Z7984 Long term (current) use of oral hypoglycemic drugs: Secondary | ICD-10-CM | POA: Diagnosis not present

## 2018-01-04 DIAGNOSIS — D509 Iron deficiency anemia, unspecified: Secondary | ICD-10-CM | POA: Diagnosis not present

## 2018-01-04 DIAGNOSIS — Z79899 Other long term (current) drug therapy: Secondary | ICD-10-CM | POA: Diagnosis not present

## 2018-01-04 LAB — CBC WITH DIFFERENTIAL/PLATELET
BASOS PCT: 1 %
Basophils Absolute: 0.1 10*3/uL (ref 0–0.1)
EOS ABS: 0.3 10*3/uL (ref 0–0.7)
EOS PCT: 6 %
HCT: 38.7 % (ref 35.0–47.0)
Hemoglobin: 12.5 g/dL (ref 12.0–16.0)
Lymphocytes Relative: 23 %
Lymphs Abs: 1.3 10*3/uL (ref 1.0–3.6)
MCH: 25.1 pg — ABNORMAL LOW (ref 26.0–34.0)
MCHC: 32.4 g/dL (ref 32.0–36.0)
MCV: 77.5 fL — ABNORMAL LOW (ref 80.0–100.0)
MONOS PCT: 13 %
Monocytes Absolute: 0.8 10*3/uL (ref 0.2–0.9)
NEUTROS PCT: 57 %
Neutro Abs: 3.2 10*3/uL (ref 1.4–6.5)
PLATELETS: 361 10*3/uL (ref 150–440)
RBC: 4.99 MIL/uL (ref 3.80–5.20)
RDW: 28.7 % — ABNORMAL HIGH (ref 11.5–14.5)
WBC: 5.7 10*3/uL (ref 3.6–11.0)

## 2018-01-04 LAB — FERRITIN: Ferritin: 16 ng/mL (ref 11–307)

## 2018-01-04 LAB — IRON AND TIBC
Iron: 30 ug/dL (ref 28–170)
SATURATION RATIOS: 8 % — AB (ref 10.4–31.8)
TIBC: 374 ug/dL (ref 250–450)
UIBC: 344 ug/dL

## 2018-01-05 ENCOUNTER — Inpatient Hospital Stay: Payer: BLUE CROSS/BLUE SHIELD

## 2018-01-05 ENCOUNTER — Encounter: Payer: Self-pay | Admitting: Oncology

## 2018-01-05 ENCOUNTER — Inpatient Hospital Stay: Payer: BLUE CROSS/BLUE SHIELD | Admitting: Oncology

## 2018-01-05 VITALS — BP 141/87 | HR 74 | Temp 97.0°F | Resp 18 | Wt 198.2 lb

## 2018-01-05 DIAGNOSIS — I10 Essential (primary) hypertension: Secondary | ICD-10-CM | POA: Diagnosis not present

## 2018-01-05 DIAGNOSIS — E119 Type 2 diabetes mellitus without complications: Secondary | ICD-10-CM | POA: Diagnosis not present

## 2018-01-05 DIAGNOSIS — Z79899 Other long term (current) drug therapy: Secondary | ICD-10-CM | POA: Diagnosis not present

## 2018-01-05 DIAGNOSIS — J45909 Unspecified asthma, uncomplicated: Secondary | ICD-10-CM | POA: Diagnosis not present

## 2018-01-05 DIAGNOSIS — D500 Iron deficiency anemia secondary to blood loss (chronic): Secondary | ICD-10-CM

## 2018-01-05 DIAGNOSIS — N920 Excessive and frequent menstruation with regular cycle: Secondary | ICD-10-CM | POA: Diagnosis not present

## 2018-01-05 DIAGNOSIS — D508 Other iron deficiency anemias: Secondary | ICD-10-CM

## 2018-01-05 DIAGNOSIS — Z7984 Long term (current) use of oral hypoglycemic drugs: Secondary | ICD-10-CM

## 2018-01-05 DIAGNOSIS — G4730 Sleep apnea, unspecified: Secondary | ICD-10-CM

## 2018-01-05 DIAGNOSIS — D509 Iron deficiency anemia, unspecified: Secondary | ICD-10-CM

## 2018-01-05 LAB — HEMOGLOBINOPATHY EVALUATION
HGB A: 98 % (ref 96.4–98.8)
HGB F QUANT: 0 % (ref 0.0–2.0)
HGB S QUANTITAION: 0 %
HGB VARIANT: 0 %
Hgb A2 Quant: 2 % (ref 1.8–3.2)
Hgb C: 0 %

## 2018-01-05 MED ORDER — SODIUM CHLORIDE 0.9 % IV SOLN
510.0000 mg | Freq: Once | INTRAVENOUS | Status: AC
  Administered 2018-01-05: 510 mg via INTRAVENOUS
  Filled 2018-01-05: qty 17

## 2018-01-05 MED ORDER — SODIUM CHLORIDE 0.9 % IV SOLN
Freq: Once | INTRAVENOUS | Status: AC
  Administered 2018-01-05: 09:00:00 via INTRAVENOUS
  Filled 2018-01-05: qty 250

## 2018-01-05 NOTE — Progress Notes (Signed)
Patient denies any concerns today.  

## 2018-01-08 ENCOUNTER — Ambulatory Visit
Admission: EM | Admit: 2018-01-08 | Discharge: 2018-01-08 | Disposition: A | Discharge status: Home / Self Care | Payer: BLUE CROSS/BLUE SHIELD | Attending: Family Medicine | Admitting: Family Medicine

## 2018-01-08 ENCOUNTER — Other Ambulatory Visit: Payer: Self-pay

## 2018-01-08 DIAGNOSIS — B9689 Other specified bacterial agents as the cause of diseases classified elsewhere: Secondary | ICD-10-CM

## 2018-01-08 DIAGNOSIS — R102 Pelvic and perineal pain: Secondary | ICD-10-CM | POA: Diagnosis not present

## 2018-01-08 DIAGNOSIS — N760 Acute vaginitis: Secondary | ICD-10-CM | POA: Diagnosis not present

## 2018-01-08 LAB — URINALYSIS, COMPLETE (UACMP) WITH MICROSCOPIC
BACTERIA UA: NONE SEEN
BILIRUBIN URINE: NEGATIVE
Glucose, UA: NEGATIVE mg/dL
Hgb urine dipstick: NEGATIVE
KETONES UR: NEGATIVE mg/dL
LEUKOCYTES UA: NEGATIVE
Nitrite: NEGATIVE
PROTEIN: NEGATIVE mg/dL
SPECIFIC GRAVITY, URINE: 1.025 (ref 1.005–1.030)
pH: 6 (ref 5.0–8.0)

## 2018-01-08 LAB — WET PREP, GENITAL
Sperm: NONE SEEN
Trich, Wet Prep: NONE SEEN
YEAST WET PREP: NONE SEEN

## 2018-01-08 LAB — CHLAMYDIA/NGC RT PCR (ARMC ONLY)
Chlamydia Tr: NOT DETECTED
N gonorrhoeae: NOT DETECTED

## 2018-01-08 MED ORDER — METRONIDAZOLE 500 MG PO TABS: 500.0000 mg | ORAL_TABLET | Freq: Two times a day (BID) | ORAL | 0 refills | Status: DC

## 2018-01-08 NOTE — Discharge Instructions (Addendum)
Take medication as prescribed. Rest. Drink plenty of fluids.  Counter Tylenol and ibuprofen as needed.  Follow up with your gynecologist tomorrow as scheduled.  This is important.  Follow up with your primary care physician this week as needed. Return to Urgent care for new or worsening concerns.

## 2018-01-08 NOTE — ED Triage Notes (Signed)
Patient complains of lower abdominal pain that started 6 days ago but worsened over the weekend. Patient states that area feels swollen, states that she has been unable to exercise secondary to pain. Denies change in bowel habits or painful urination.

## 2018-01-08 NOTE — ED Provider Notes (Signed)
[facility] ____________________________________________  Time seen: Approximately 09:02 AM  I have reviewed the triage vital signs and the nursing notes.   HISTORY  Chief Complaint Abdominal Pain   HPI Donna Reyes is a 42 y.o. female presenting for evaluation of 6 days lower pelvic pain and discomfort, and reports over this weekend pain increased and became more frequent.  States pain does still occasionally go away but returns.  States pain is currently moderate midline suprapubic area.  Denies any other abdominal pain or tenderness.  Last bowel movement was yesterday and described as normal.  Denies urinary frequency, urinary urgency or burning with urination.  Denies atypical back pain.  States has had a recent cold that she is getting over with nasal congestion and occasional cough.  States that she did have a low-grade fever last week but thinks it was related to her cold and none since.  States cold is getting better.  Reports for the last 2 weeks she has had a large amount of whitish-yellowish vaginal discharge, but reports it was nonodorous.  No rash or lesions. Patient does report she has had similar pain with previous bacterial vaginosis infections. Reports sexually active with same partner.  Denies high concerns of STDs, but would right to have gonorrhea chlamydia, HIV, syphilis and herpes testing.  Denies concerns of pregnancy, previous tubal ligation.  States that she has an appointment with her gynecologist tomorrow for the same complaint.  Has not been taken over-the-counter medications.  Denies other aggravating alleviating factors.  Denies chest pain, shortness of breath or rash.  Denies recent antibiotic use.    GYN: Amalia Hailey  Past Medical History:  Diagnosis Date  . Allergy   . Anemia   . Asthma   . Diabetes mellitus without complication ([place])   . Hypertension   . Sleep apnea     Patient Active Problem List   Diagnosis Date Noted  .  Lymphadenopathy, axillary 10/27/2017  . Anemia 10/27/2017  . Iron deficiency anemia 12/30/2015    Past Surgical History:  Procedure Laterality Date  . AXILLARY LYMPH NODE BIOPSY Right 05/25/2017   LYMPHADENITIS. NEGATIVE FOR MALIGNANCY.   Marland Kitchen BREAST BIOPSY Right 05/25/2017   neg/NEGATIVE FOR CARCINOMA. INFLAMMATION AND SQUAMOUS METAPLASIA OF A FOCAL DUCT   . CESAREAN SECTION     times 3  . TUBAL LIGATION       No current facility-administered medications for this encounter.   Current Outpatient Medications:  .  amLODipine (NORVASC) 5 MG tablet, Take 5 mg by mouth daily. , Disp: , Rfl: 10 .  metFORMIN (GLUCOPHAGE) 500 MG tablet, Take 500 mg by mouth daily with breakfast., Disp: , Rfl:  .  albuterol (PROVENTIL) (2.5 MG/3ML) 0.083% nebulizer solution, Take 2.5 mg by nebulization every 6 (six) hours as needed for wheezing or shortness of breath., Disp: , Rfl:  .  metroNIDAZOLE (FLAGYL) 500 MG tablet, Take 1 tablet (500 mg total) by mouth 2 (two) times daily., Disp: 14 tablet, Rfl: 0  Allergies Lisinopril  Family History  Problem Relation Age of Onset  . Diabetes Mother   . Diabetes Maternal Grandmother   . Cancer Maternal Grandmother   . Breast cancer Maternal Grandmother 42  . Hypertension Father   . Hyperlipidemia Father   . Ovarian cancer Neg Hx   . Colon cancer Neg Hx      Social History Social History   Tobacco Use  . Smoking status: Never Smoker  . Smokeless tobacco: Never Used  Substance Use  Topics  . Alcohol use: No  . Drug use: No    Review of Systems Constitutional: No recent fever. ENT: No sore throat. Cardiovascular: Denies chest pain. Respiratory: Denies shortness of breath. Gastrointestinal: No abdominal pain.  No nausea, no vomiting.  No diarrhea.  No constipation. Genitourinary: Negative for dysuria. Musculoskeletal: Negative for back pain. Skin: Negative for rash.   ____________________________________________   PHYSICAL EXAM:  VITAL  SIGNS: ED Triage Vitals  Enc Vitals Group     BP 01/08/18 0843 131/90     Pulse Rate 01/08/18 0843 78     Resp 01/08/18 0843 18     Temp 01/08/18 0843 98.3 F (36.8 C)     Temp Source 01/08/18 0843 Oral     SpO2 01/08/18 0843 100 %     Weight 01/08/18 0841 196 lb (88.9 kg)     Height 01/08/18 0841 5\' 2"  (1.575 m)     Head Circumference --      Peak Flow --      Pain Score 01/08/18 0840 7     Pain Loc --      Pain Edu? --      Excl. in [place]? --     Constitutional: Alert and oriented. Well appearing and in no acute distress. ENT      Head: Normocephalic and atraumatic. Cardiovascular: Normal rate, regular rhythm. Grossly normal heart sounds.  Good peripheral circulation. Respiratory: Normal respiratory effort without tachypnea nor retractions. Breath sounds are clear and equal bilaterally. No wheezes, rales, rhonchi. Gastrointestinal:Normal Bowel sounds. Moderate midline suprapubic tenderness to palpation. Mild diffuse abdominal pain otherwise. No CVA tenderness. Pelvic: exam completed with Ria Comment RN at bedside as chaperone.  External: Normal appearance, no rash or lesion.  Speculum: Moderate amount of white vaginal discharge, no bleeding, no foreign body, cervical os closed.  Bimanual: No cervical motion tenderness, mild bilateral adnexal tenderness.  Musculoskeletal:  No midline cervical, thoracic or lumbar tenderness to palpation.  Neurologic:  Normal speech and language.Speech is normal. No gait instability.  Skin:  Skin is warm, dry and intact. No rash noted. Psychiatric: Mood and affect are normal. Speech and behavior are normal. Patient exhibits appropriate insight and judgment   ___________________________________________   LABS (all labs ordered are listed, but only abnormal results are displayed)  Labs Reviewed  WET PREP, GENITAL - Abnormal; Notable for the following components:      Result Value   Clue Cells Wet Prep HPF POC PRESENT (*)    WBC, Wet Prep HPF POC FEW  (*)    All other components within normal limits  CHLAMYDIA/NGC RT PCR ([facility] ONLY)  URINALYSIS, COMPLETE (UACMP) WITH MICROSCOPIC  RPR  HIV ANTIBODY (ROUTINE TESTING W REFLEX)  HSV(HERPES SIMPLEX VRS) I + II AB-IGG  HSV(HERPES SIMPLEX VRS) I + II AB-IGM    RADIOLOGY  No results found. ____________________________________________   PROCEDURES Procedures    INITIAL IMPRESSION / ASSESSMENT AND PLAN / ED COURSE  Pertinent labs & imaging results that were available during my care of the patient were reviewed by me and considered in my medical decision making (see chart for details).  Well-appearing patient.  No acute distress.  Patient with suprapubic tenderness and vaginal discharge.  No cervical motion tenderness.  Mild bilateral adnexal tenderness.  Positive for BV.  Will treat with Flagyl for bacterial vaginosis.  Patient has follow-up with gynecologist tomorrow.  Discussed evaluation of ultrasound, however patient states she feels comfortable with waiting for appointment with gynecologist tomorrow.  Take  over-the-counter Tylenol and ibuprofen.  Encourage rest, fluids and supportive care.Discussed indication, risks and benefits of medications with patient.  Discussed follow up with Primary care physician this week. Discussed follow up and return parameters including no resolution or any worsening concerns. Patient verbalized understanding and agreed to plan.   ____________________________________________   FINAL CLINICAL IMPRESSION(S) / ED DIAGNOSES  Final diagnoses:  Bacterial vaginosis  Pelvic pain     ED Discharge Orders         Ordered    metroNIDAZOLE (FLAGYL) 500 MG tablet  2 times daily     01/08/18 0953           Note: This dictation was prepared with Dragon dictation along with smaller phrase technology. Any transcriptional errors that result from this process are unintentional.         Marylene Land, NP 01/08/18 1016

## 2018-01-09 ENCOUNTER — Ambulatory Visit: Payer: BLUE CROSS/BLUE SHIELD | Admitting: Obstetrics and Gynecology

## 2018-01-09 ENCOUNTER — Encounter: Payer: Self-pay | Admitting: Obstetrics and Gynecology

## 2018-01-09 VITALS — BP 123/84 | HR 92 | Ht 62.0 in | Wt 198.0 lb

## 2018-01-09 DIAGNOSIS — R102 Pelvic and perineal pain: Secondary | ICD-10-CM | POA: Diagnosis not present

## 2018-01-09 LAB — HIV ANTIBODY (ROUTINE TESTING W REFLEX): HIV Screen 4th Generation wRfx: NONREACTIVE

## 2018-01-09 LAB — HSV(HERPES SIMPLEX VRS) I + II AB-IGG
HSV 1 Glycoprotein G Ab, IgG: 38.6 index — ABNORMAL HIGH (ref 0.00–0.90)
HSV 2 Glycoprotein G Ab, IgG: <0.91 index (ref 0.00–0.90)

## 2018-01-09 LAB — HSV(HERPES SIMPLEX VRS) I + II AB-IGM: HSVI/II Comb IgM: 1.75 Ratio — ABNORMAL HIGH (ref 0.00–0.90)

## 2018-01-09 LAB — RPR: RPR Ser Ql: NONREACTIVE

## 2018-01-09 NOTE — Progress Notes (Signed)
Pt presents today with pelvic pain and discharge. Pt was seen yesterday at [facility] and dx with BV, was given Rx antibiotics.

## 2018-01-09 NOTE — Progress Notes (Signed)
HPI:      [name] is a 42 y.o. [id number] who LMP was Patient's last menstrual period was 12/17/2017 (exact date).  Subjective:   She presents today after being seen yesterday in urgent care for pelvic pain.  They did cultures and she would like to find out the results of those cultures.  She says they did find that she had BV and she is currently taking metronidazole.  Patient states that her pain is some what better today and she is taking ibuprofen which helps make it "manageable".  This is not the normal pain she experiences with her menstrual periods.  She was advised by urgent care to go to a gynecologist and have an ultrasound performed.  She is requesting this ultrasound. Of significant note patient is presumed to have uterine fibroids with a 14-week uterine size. Patient has a tubal ligation for birth control.    Hx: The following portions of the patient's history were reviewed and updated as appropriate:             She  has a past medical history of Allergy, Anemia, Asthma, Diabetes mellitus without complication (Donna Reyes), Hypertension, and Sleep apnea. She does not have any pertinent problems on file. She  has a past surgical history that includes Cesarean section; Tubal ligation; Breast biopsy (Right, 05/25/2017); and Axillary lymph node biopsy (Right, 05/25/2017). Her family history includes Breast cancer (age of onset: 60) in her maternal grandmother; Cancer in her maternal grandmother; Diabetes in her maternal grandmother and mother; Hyperlipidemia in her father; Hypertension in her father. She  reports that she has never smoked. She has never used smokeless tobacco. She reports that she does not drink alcohol or use drugs. She has a current medication list which includes the following prescription(s): albuterol, amlodipine, metformin, and metronidazole. She is allergic to lisinopril.       Review of Systems:  Review of Systems  Constitutional: Denied constitutional  symptoms, night sweats, recent illness, fatigue, fever, insomnia and weight loss.  Eyes: Denied eye symptoms, eye pain, photophobia, vision change and visual disturbance.  Ears/Nose/Throat/Neck: Denied ear, nose, throat or neck symptoms, hearing loss, nasal discharge, sinus congestion and sore throat.  Cardiovascular: Denied cardiovascular symptoms, arrhythmia, chest pain/pressure, edema, exercise intolerance, orthopnea and palpitations.  Respiratory: Denied pulmonary symptoms, asthma, pleuritic pain, productive sputum, cough, dyspnea and wheezing.  Gastrointestinal: Denied, gastro-esophageal reflux, melena, nausea and vomiting.  Genitourinary: See HPI for additional information.  Musculoskeletal: Denied musculoskeletal symptoms, stiffness, swelling, muscle weakness and myalgia.  Dermatologic: Denied dermatology symptoms, rash and scar.  Neurologic: Denied neurology symptoms, dizziness, headache, neck pain and syncope.  Psychiatric: Denied psychiatric symptoms, anxiety and depression.  Endocrine: Denied endocrine symptoms including hot flashes and night sweats.   Meds:   Current Outpatient Medications on File Prior to Visit  Medication Sig Dispense Refill  . albuterol (PROVENTIL) (2.5 MG/3ML) 0.083% nebulizer solution Take 2.5 mg by nebulization every 6 (six) hours as needed for wheezing or shortness of breath.    Marland Kitchen amLODipine (NORVASC) 5 MG tablet Take 5 mg by mouth daily.   10  . metFORMIN (GLUCOPHAGE) 500 MG tablet Take 500 mg by mouth daily with breakfast.    . metroNIDAZOLE (FLAGYL) 500 MG tablet Take 1 tablet (500 mg total) by mouth 2 (two) times daily. 14 tablet 0   No current facility-administered medications on file prior to visit.     Objective:     Vitals:   01/09/18 1030  BP: 123/84  Pulse: 92              Lab and culture results reviewed from yesterday at urgent care.  I have discussed all of these in detail with the patient.  Assessment:    [id number] Patient Active  Problem List   Diagnosis Date Noted  . Lymphadenopathy, axillary 10/27/2017  . Anemia 10/27/2017  . Iron deficiency anemia 12/30/2015     1. Pelvic pain in female     Pain improving and manageable with ibuprofen.  Patient with uterine fibroids.  It is possible that her pain is a result of these fibroids.   Plan:            1.  Ultrasound for pelvic pain.  We will contact her if there are any additional findings with the exception of uterine fibroids.  2.  Patient to contact us if her symptoms worsen.  3.  Discussed HSV-1 in detail  4.  Discussed BV in detail. Orders Orders Placed This Encounter  Procedures  . US PELVIS (TRANSABDOMINAL ONLY)    No orders of the defined types were placed in this encounter.     F/U  Return for Pt to contact us if symptoms worsen, We will contact her with any abnormal test results. I spent 17 minutes involved in the care of this patient of which greater than 50% was spent discussing her culture results from yesterday, BV, HSV 1, causes of pelvic pain in females, uterine fibroids and the relationship to pelvic pain, use of NSAID S for pain.  All questions answered.  Finis Bud, M.D. 01/09/2018 11:06 AM

## 2018-01-10 ENCOUNTER — Other Ambulatory Visit: Payer: Self-pay | Admitting: Physician Assistant

## 2018-01-10 ENCOUNTER — Ambulatory Visit (INDEPENDENT_AMBULATORY_CARE_PROVIDER_SITE_OTHER): Payer: BLUE CROSS/BLUE SHIELD

## 2018-01-10 DIAGNOSIS — Z1231 Encounter for screening mammogram for malignant neoplasm of breast: Secondary | ICD-10-CM

## 2018-01-10 DIAGNOSIS — R102 Pelvic and perineal pain: Secondary | ICD-10-CM | POA: Diagnosis not present

## 2018-02-02 ENCOUNTER — Ambulatory Visit
Admission: RE | Admit: 2018-02-02 | Discharge: 2018-02-02 | Disposition: A | Discharge status: Home / Self Care | Payer: BLUE CROSS/BLUE SHIELD | Source: Ambulatory Visit | Attending: Physician Assistant | Admitting: Physician Assistant

## 2018-02-02 DIAGNOSIS — Z1231 Encounter for screening mammogram for malignant neoplasm of breast: Secondary | ICD-10-CM | POA: Insufficient documentation

## 2018-03-29 ENCOUNTER — Other Ambulatory Visit: Payer: Self-pay

## 2018-03-29 DIAGNOSIS — D500 Iron deficiency anemia secondary to blood loss (chronic): Secondary | ICD-10-CM

## 2018-03-30 NOTE — Progress Notes (Deleted)
[place]  [phone]) [phone] [phone]) [phone]  ID: Donna Reyes OB: [birth year]  MR#: [id number]  DPO#:[id number]  Patient Care Team: Karen Kitchens, MD as PCP - General (Family Medicine)  CHIEF COMPLAINT: Iron deficiency anemia  INTERVAL HISTORY: Patient returns to clinic today for repeat laboratory work and further evaluation.  She currently feels well and is asymptomatic.  She does not complain of weakness or fatigue today.  She has no neurologic complaints. She denies any recent fevers or illnesses. She has a good appetite and denies weight loss. She denies any chest pain or shortness of breath. She denies any nausea, vomiting, constipation, or diarrhea. She has no melena or hematochezia. She has no urinary complaints.  Patient feels at her baseline offers no specific complaints today.  REVIEW OF SYSTEMS:   Review of Systems  Constitutional: Negative.  Negative for fever, malaise/fatigue and weight loss.  Respiratory: Negative.  Negative for cough and shortness of breath.   Cardiovascular: Negative.  Negative for chest pain and leg swelling.  Gastrointestinal: Negative.  Negative for abdominal pain, blood in stool and melena.  Genitourinary: Negative.  Negative for hematuria.  Musculoskeletal: Negative.  Negative for myalgias.  Skin: Negative.  Negative for rash.  Neurological: Negative.  Negative for focal weakness, weakness and headaches.  Psychiatric/Behavioral: Negative.  The patient is not nervous/anxious.     As per HPI. Otherwise, a complete review of systems is negative.  PAST MEDICAL HISTORY: Past Medical History:  Diagnosis Date  . Allergy   . Anemia   . Asthma   . Diabetes mellitus without complication ([place])   . Hypertension   . Sleep apnea     PAST SURGICAL HISTORY: Past Surgical History:  Procedure Laterality Date  . AXILLARY LYMPH NODE BIOPSY Right 05/25/2017   LYMPHADENITIS. NEGATIVE FOR MALIGNANCY.   Marland Kitchen BREAST  BIOPSY Right 05/25/2017   neg/NEGATIVE FOR CARCINOMA. INFLAMMATION AND SQUAMOUS METAPLASIA OF A FOCAL DUCT   . CESAREAN SECTION     times 3  . TUBAL LIGATION      FAMILY HISTORY: Reviewed and unchanged. No reported history of malignancy or chronic disease.  ADVANCED DIRECTIVES (Y/N):  N  HEALTH MAINTENANCE: Social History   Tobacco Use  . Smoking status: Never Smoker  . Smokeless tobacco: Never Used  Substance Use Topics  . Alcohol use: No  . Drug use: No     Colonoscopy:  PAP:  Bone density:  Lipid panel:  Allergies  Allergen Reactions  . Lisinopril     Current Outpatient Medications  Medication Sig Dispense Refill  . albuterol (PROVENTIL) (2.5 MG/3ML) 0.083% nebulizer solution Take 2.5 mg by nebulization every 6 (six) hours as needed for wheezing or shortness of breath.    Marland Kitchen amLODipine (NORVASC) 5 MG tablet Take 5 mg by mouth daily.   10  . metFORMIN (GLUCOPHAGE) 500 MG tablet Take 500 mg by mouth daily with breakfast.    . metroNIDAZOLE (FLAGYL) 500 MG tablet Take 1 tablet (500 mg total) by mouth 2 (two) times daily. 14 tablet 0   No current facility-administered medications for this visit.     OBJECTIVE: There were no vitals filed for this visit.   There is no height or weight on file to calculate BMI.    ECOG FS:0 - Asymptomatic  General: Well-developed, well-nourished, no acute distress. Eyes: Pink conjunctiva, anicteric sclera. HEENT: Normocephalic, moist mucous membranes. Lungs: Clear to auscultation bilaterally. Heart: Regular rate and rhythm. No rubs, murmurs, or gallops. Abdomen: Soft,  nontender, nondistended. No organomegaly noted, normoactive bowel sounds. Musculoskeletal: No edema, cyanosis, or clubbing. Neuro: Alert, answering all questions appropriately. Cranial nerves grossly intact. Skin: No rashes or petechiae noted. Psych: Normal affect.   LAB RESULTS:  Lab Results  Component Value Date   NA 137 12/13/2016   K 3.9 12/13/2016   CL 106  12/13/2016   CO2 25 12/13/2016   GLUCOSE 103 (H) 12/13/2016   BUN 11 12/13/2016   CREATININE 0.50 06/29/2017   CALCIUM 8.6 (L) 12/13/2016   PROT 7.3 12/13/2016   ALBUMIN 3.8 12/13/2016   AST 14 (L) 12/13/2016   ALT 11 (L) 12/13/2016   ALKPHOS 45 12/13/2016   BILITOT 0.5 12/13/2016   GFRNONAA >60 12/13/2016   GFRAA >60 12/13/2016    Lab Results  Component Value Date   WBC 5.7 01/04/2018   NEUTROABS 3.2 01/04/2018   HGB 12.5 01/04/2018   HCT 38.7 01/04/2018   MCV 77.5 (L) 01/04/2018   PLT 361 01/04/2018   Lab Results  Component Value Date   IRON 30 01/04/2018   TIBC 374 01/04/2018   IRONPCTSAT 8 (L) 01/04/2018   Lab Results  Component Value Date   FERRITIN 16 01/04/2018     STUDIES: No results found.  ASSESSMENT: Iron deficiency anemia  PLAN:    1. Iron deficiency anemia: Likely secondary to continued heavy menses.  Patient reports she cannot tolerate oral iron supplementation.  Patient's hemoglobin has significantly improved and is now within normal limits.  She continues to have a decreased iron saturation at 8%.  After discussion with the patient, she agreed to receive 1 additional infusion of 510 mg IV Feraheme today.  Return to clinic in 3 months with repeat laboratory work and further evaluation.   2.  Low MCV: Improving.  Hemoglobinopathy profile within normal limits.  3.  Thrombocytosis: Resolved.  Likely secondary to iron deficiency.  Patient expressed understanding and was in agreement with this plan. She also understands that She can call clinic at any time with any questions, concerns, or complaints.    Lloyd Huger, MD 03/30/18 11:41 AM

## 2018-03-31 ENCOUNTER — Ambulatory Visit
Admission: EM | Admit: 2018-03-31 | Discharge: 2018-03-31 | Disposition: A | Discharge status: Home / Self Care | Payer: BLUE CROSS/BLUE SHIELD | Attending: Family Medicine | Admitting: Family Medicine

## 2018-03-31 ENCOUNTER — Ambulatory Visit
Admission: RE | Admit: 2018-03-31 | Discharge: 2018-03-31 | Disposition: A | Discharge status: Home / Self Care | Payer: BLUE CROSS/BLUE SHIELD | Source: Ambulatory Visit | Attending: Family Medicine | Admitting: Family Medicine

## 2018-03-31 ENCOUNTER — Other Ambulatory Visit: Payer: Self-pay

## 2018-03-31 ENCOUNTER — Encounter: Payer: Self-pay | Admitting: Gynecology

## 2018-03-31 ENCOUNTER — Telehealth: Payer: Self-pay | Admitting: Family Medicine

## 2018-03-31 DIAGNOSIS — R2241 Localized swelling, mass and lump, right lower limb: Secondary | ICD-10-CM | POA: Diagnosis not present

## 2018-03-31 DIAGNOSIS — M79661 Pain in right lower leg: Secondary | ICD-10-CM

## 2018-03-31 DIAGNOSIS — M7989 Other specified soft tissue disorders: Secondary | ICD-10-CM

## 2018-03-31 MED ORDER — MELOXICAM 15 MG PO TABS: 15.0000 mg | ORAL_TABLET | Freq: Every day | ORAL | 0 refills | Status: DC | PRN

## 2018-03-31 NOTE — ED Triage Notes (Signed)
Patient c/o right leg pain x 1 week ago.

## 2018-03-31 NOTE — ED Provider Notes (Signed)
[facility]    CSN: [id number] Arrival date & time: 03/31/18  1252  History   Chief Complaint Chief Complaint  Patient presents with  . Leg Pain   HPI  42 year old female presents with right lower leg pain.  Endorses right calf pain and associated swelling.  She states it is radiating upward.  Denies back pain.  Patient states that she recently took a long trip to [place] by car.  She states that she stopped about 4 times each way.  She traveled home on 12/17.  Denies shortness of breath.  Pain is severe per report.  Interfering with ambulation.  She does not recall any fall or trauma or injury.  No interventions tried.  No other associated symptoms.  No other complaints.  PMH, Surgical Hx, Family Hx, Social History reviewed and updated as below.  Past Medical History:  Diagnosis Date  . Allergy   . Anemia   . Asthma   . Diabetes mellitus without complication ([place])   . Hypertension   . Sleep apnea     Patient Active Problem List   Diagnosis Date Noted  . Lymphadenopathy, axillary 10/27/2017  . Anemia 10/27/2017  . Iron deficiency anemia 12/30/2015    Past Surgical History:  Procedure Laterality Date  . AXILLARY LYMPH NODE BIOPSY Right 05/25/2017   LYMPHADENITIS. NEGATIVE FOR MALIGNANCY.   Marland Kitchen BREAST BIOPSY Right 05/25/2017   neg/NEGATIVE FOR CARCINOMA. INFLAMMATION AND SQUAMOUS METAPLASIA OF A FOCAL DUCT   . CESAREAN SECTION     times 3  . TUBAL LIGATION      OB History    Gravida  5   Para  5   Term  5   Preterm      AB      Living  5     SAB      TAB      Ectopic      Multiple      Live Births  5        Obstetric Comments  1st Menstrual Cycle:  13 1st Pregnancy:  14          Home Medications    Prior to Admission medications   Medication Sig Start Date End Date Taking? Authorizing Provider  albuterol (PROVENTIL) (2.5 MG/3ML) 0.083% nebulizer solution Take 2.5 mg by nebulization every 6 (six) hours as needed for  wheezing or shortness of breath.   Yes [provider]  amLODipine (NORVASC) 5 MG tablet Take 5 mg by mouth daily.    Yes [provider]  meloxicam (MOBIC) 15 MG tablet Take 1 tablet (15 mg total) by mouth daily as needed. 03/31/18   Coral Spikes, DO  metroNIDAZOLE (FLAGYL) 500 MG tablet Take 1 tablet (500 mg total) by mouth 2 (two) times daily. 01/08/18   Marylene Land, NP    Family History Family History  Problem Relation Age of Onset  . Diabetes Mother   . Diabetes Maternal Grandmother   . Cancer Maternal Grandmother   . Breast cancer Maternal Grandmother 77  . Hypertension Father   . Hyperlipidemia Father   . Ovarian cancer Neg Hx   . Colon cancer Neg Hx     Social History Social History   Tobacco Use  . Smoking status: Never Smoker  . Smokeless tobacco: Never Used  Substance Use Topics  . Alcohol use: No  . Drug use: No     Allergies   Lisinopril   Review of Systems Review of Systems  Respiratory: Negative.   Musculoskeletal:       Calf pain.   Physical Exam Triage Vital Signs ED Triage Vitals  Enc Vitals Group     BP 03/31/18 1349 (!) 141/96     Pulse Rate 03/31/18 1349 79     Resp 03/31/18 1349 16     Temp 03/31/18 1349 98.2 F (36.8 C)     Temp Source 03/31/18 1349 Oral     SpO2 03/31/18 1349 99 %     Weight 03/31/18 1348 203 lb (92.1 kg)     Height 03/31/18 1348 5\' 2"  (1.575 m)     Head Circumference --      Peak Flow --      Pain Score 03/31/18 1348 6     Pain Loc --      Pain Edu? --      Excl. in [place]? --    Updated Vital Signs BP (!) 141/96 (BP Location: Left Arm)   Pulse 79   Temp 98.2 F (36.8 C) (Oral)   Resp 16   Ht 5\' 2"  (1.575 m)   Wt 92.1 kg   LMP 03/13/2018   SpO2 99%   BMI 37.13 kg/m   Visual Acuity Right Eye Distance:   Left Eye Distance:   Bilateral Distance:    Right Eye Near:   Left Eye Near:    Bilateral Near:     Physical Exam Vitals signs and nursing note reviewed.  Constitutional:       General: She is not in acute distress. HENT:     Head: Normocephalic and atraumatic.  Eyes:     General: No scleral icterus.    Conjunctiva/sclera: Conjunctivae normal.  Cardiovascular:     Rate and Rhythm: Normal rate and regular rhythm.  Pulmonary:     Effort: Pulmonary effort is normal. No respiratory distress.     Breath sounds: No wheezing, rhonchi or rales.  Musculoskeletal:     Comments: Right calf with slight swelling.  Neurovascular intact distally.  No appreciable cords or erythema.  Neurological:     Mental Status: She is alert.  Psychiatric:        Mood and Affect: Mood normal.        Behavior: Behavior normal.    UC Treatments / Results  Labs (all labs ordered are listed, but only abnormal results are displayed) Labs Reviewed - No data to display  EKG None  Radiology US Venous Img Lower Unilateral Right  Result Date: 03/31/2018 CLINICAL DATA:  Right lower extremity pain and swelling. EXAM: Right LOWER EXTREMITY VENOUS DOPPLER ULTRASOUND TECHNIQUE: Gray-scale sonography with graded compression, as well as color Doppler and duplex ultrasound were performed to evaluate the lower extremity deep venous systems from the level of the common femoral vein and including the common femoral, femoral, profunda femoral, popliteal and calf veins including the posterior tibial, peroneal and gastrocnemius veins when visible. The superficial great saphenous vein was also interrogated. Spectral Doppler was utilized to evaluate flow at rest and with distal augmentation maneuvers in the common femoral, femoral and popliteal veins. COMPARISON:  None. FINDINGS: Contralateral Common Femoral Vein: Respiratory phasicity is normal and symmetric with the symptomatic side. No evidence of thrombus. Normal compressibility. Common Femoral Vein: No evidence of thrombus. Normal compressibility, respiratory phasicity and response to augmentation. Saphenofemoral Junction: No evidence of thrombus. Normal  compressibility and flow on color Doppler imaging. Profunda Femoral Vein: No evidence of thrombus. Normal compressibility and flow on color Doppler imaging. Femoral Vein:  No evidence of thrombus. Normal compressibility, respiratory phasicity and response to augmentation. Popliteal Vein: No evidence of thrombus. Normal compressibility, respiratory phasicity and response to augmentation. Calf Veins: No evidence of thrombus. Normal compressibility and flow on color Doppler imaging. Venous Reflux:  None. Other Findings:  None. IMPRESSION: No evidence of deep venous thrombosis. Electronically Signed   By: Marijo Conception, M.D.   On: 03/31/2018 16:02    Procedures Procedures (including critical care time)  Medications Ordered in UC Medications - No data to display  Initial Impression / Assessment and Plan / UC Course  I have reviewed the triage vital signs and the nursing notes.  Pertinent labs & imaging results that were available during my care of the patient were reviewed by me and considered in my medical decision making (see chart for details).    42 year old female presents with right calf pain and swelling.  Concern for DVT given recent travel.  Ultrasound was negative.  Patient was informed of the results.  I have sent in meloxicam for her pain.  Final Clinical Impressions(s) / UC Diagnoses   Final diagnoses:  Right calf pain     Discharge Instructions     We will call with the results.  Take care  Dr. Lacinda Axon     ED Prescriptions    None     Controlled Substance Prescriptions [place] Controlled Substance Registry consulted? Not Applicable   Coral Spikes, DO 03/31/18 1823

## 2018-03-31 NOTE — Telephone Encounter (Signed)
Rx sent 

## 2018-03-31 NOTE — Discharge Instructions (Signed)
We will call with the results.  Take care  Dr. Nala Kachel  

## 2018-04-05 ENCOUNTER — Inpatient Hospital Stay: Payer: BLUE CROSS/BLUE SHIELD

## 2018-04-06 ENCOUNTER — Ambulatory Visit: Payer: BLUE CROSS/BLUE SHIELD

## 2018-04-06 ENCOUNTER — Ambulatory Visit: Payer: BLUE CROSS/BLUE SHIELD | Admitting: Oncology

## 2018-04-06 NOTE — Progress Notes (Signed)
[facility]  [phone]) [phone] [phone]) [phone]  ID: [name] OB: 09-27-1975  MR#: [id number]  BMW#:[id number]  Patient Care Team: Karen Kitchens, MD as PCP - General (Family Medicine)  CHIEF COMPLAINT: Iron deficiency anemia  INTERVAL HISTORY: Patient returns to clinic today for repeat laboratory work and further evaluation.  She continues to feel well and remains asymptomatic. She does not complain of weakness or fatigue today.  She has no neurologic complaints. She denies any recent fevers or illnesses. She has a good appetite and denies weight loss. She denies any chest pain or shortness of breath. She denies any nausea, vomiting, constipation, or diarrhea. She has no melena or hematochezia. She has no urinary complaints.  Patient feels at her baseline offers no specific complaints today.  REVIEW OF SYSTEMS:   Review of Systems  Constitutional: Negative.  Negative for fever, malaise/fatigue and weight loss.  Respiratory: Negative.  Negative for cough and shortness of breath.   Cardiovascular: Negative.  Negative for chest pain and leg swelling.  Gastrointestinal: Negative.  Negative for abdominal pain, blood in stool and melena.  Genitourinary: Negative.  Negative for hematuria.  Musculoskeletal: Negative.  Negative for myalgias.  Skin: Negative.  Negative for rash.  Neurological: Negative.  Negative for focal weakness, weakness and headaches.  Psychiatric/Behavioral: Negative.  The patient is not nervous/anxious.     As per HPI. Otherwise, a complete review of systems is negative.  PAST MEDICAL HISTORY: Past Medical History:  Diagnosis Date  . Allergy   . Anemia   . Asthma   . Diabetes mellitus without complication ([place])   . Hypertension   . Sleep apnea     PAST SURGICAL HISTORY: Past Surgical History:  Procedure Laterality Date  . AXILLARY LYMPH NODE BIOPSY Right 05/25/2017   LYMPHADENITIS. NEGATIVE FOR MALIGNANCY.   Marland Kitchen  BREAST BIOPSY Right 05/25/2017   neg/NEGATIVE FOR CARCINOMA. INFLAMMATION AND SQUAMOUS METAPLASIA OF A FOCAL DUCT   . CESAREAN SECTION     times 3  . TUBAL LIGATION      FAMILY HISTORY: Reviewed and unchanged. No reported history of malignancy or chronic disease.  ADVANCED DIRECTIVES (Y/N):  N  HEALTH MAINTENANCE: Social History   Tobacco Use  . Smoking status: Never Smoker  . Smokeless tobacco: Never Used  Substance Use Topics  . Alcohol use: No  . Drug use: No     Colonoscopy:  PAP:  Bone density:  Lipid panel:  Allergies  Allergen Reactions  . Lisinopril     Current Outpatient Medications  Medication Sig Dispense Refill  . albuterol (PROVENTIL) (2.5 MG/3ML) 0.083% nebulizer solution Take 2.5 mg by nebulization every 6 (six) hours as needed for wheezing or shortness of breath.    Marland Kitchen amLODipine (NORVASC) 5 MG tablet Take 5 mg by mouth daily.   10  . meloxicam (MOBIC) 15 MG tablet Take 1 tablet (15 mg total) by mouth daily as needed. (Patient not taking: Reported on 04/13/2018) 30 tablet 0   No current facility-administered medications for this visit.     OBJECTIVE: Vitals:   04/13/18 1115  BP: (!) 136/95  Pulse: 71  Resp: 16  Temp: 98.2 F (36.8 C)  SpO2: 99%     Body mass index is 37.46 kg/m.    ECOG FS:0 - Asymptomatic  General: Well-developed, well-nourished, no acute distress. Eyes: Pink conjunctiva, anicteric sclera. HEENT: Normocephalic, moist mucous membranes. Lungs: Clear to auscultation bilaterally. Heart: Regular rate and rhythm. No rubs, murmurs, or gallops. Abdomen:  Soft, nontender, nondistended. No organomegaly noted, normoactive bowel sounds. Musculoskeletal: No edema, cyanosis, or clubbing. Neuro: Alert, answering all questions appropriately. Cranial nerves grossly intact. Skin: No rashes or petechiae noted. Psych: Normal affect.  LAB RESULTS:  Lab Results  Component Value Date   NA 137 12/13/2016   K 3.9 12/13/2016   CL 106  12/13/2016   CO2 25 12/13/2016   GLUCOSE 103 (H) 12/13/2016   BUN 11 12/13/2016   CREATININE 0.50 06/29/2017   CALCIUM 8.6 (L) 12/13/2016   PROT 7.3 12/13/2016   ALBUMIN 3.8 12/13/2016   AST 14 (L) 12/13/2016   ALT 11 (L) 12/13/2016   ALKPHOS 45 12/13/2016   BILITOT 0.5 12/13/2016   GFRNONAA >60 12/13/2016   GFRAA >60 12/13/2016    Lab Results  Component Value Date   WBC 5.7 01/04/2018   NEUTROABS 3.2 01/04/2018   HGB 12.5 01/04/2018   HCT 38.7 01/04/2018   MCV 77.5 (L) 01/04/2018   PLT 361 01/04/2018   Lab Results  Component Value Date   IRON 30 01/04/2018   TIBC 374 01/04/2018   IRONPCTSAT 8 (L) 01/04/2018   Lab Results  Component Value Date   FERRITIN 16 01/04/2018     STUDIES: US Venous Img Lower Unilateral Right  Result Date: 03/31/2018 CLINICAL DATA:  Right lower extremity pain and swelling. EXAM: Right LOWER EXTREMITY VENOUS DOPPLER ULTRASOUND TECHNIQUE: Gray-scale sonography with graded compression, as well as color Doppler and duplex ultrasound were performed to evaluate the lower extremity deep venous systems from the level of the common femoral vein and including the common femoral, femoral, profunda femoral, popliteal and calf veins including the posterior tibial, peroneal and gastrocnemius veins when visible. The superficial great saphenous vein was also interrogated. Spectral Doppler was utilized to evaluate flow at rest and with distal augmentation maneuvers in the common femoral, femoral and popliteal veins. COMPARISON:  None. FINDINGS: Contralateral Common Femoral Vein: Respiratory phasicity is normal and symmetric with the symptomatic side. No evidence of thrombus. Normal compressibility. Common Femoral Vein: No evidence of thrombus. Normal compressibility, respiratory phasicity and response to augmentation. Saphenofemoral Junction: No evidence of thrombus. Normal compressibility and flow on color Doppler imaging. Profunda Femoral Vein: No evidence of  thrombus. Normal compressibility and flow on color Doppler imaging. Femoral Vein: No evidence of thrombus. Normal compressibility, respiratory phasicity and response to augmentation. Popliteal Vein: No evidence of thrombus. Normal compressibility, respiratory phasicity and response to augmentation. Calf Veins: No evidence of thrombus. Normal compressibility and flow on color Doppler imaging. Venous Reflux:  None. Other Findings:  None. IMPRESSION: No evidence of deep venous thrombosis. Electronically Signed   By: Marijo Conception, M.D.   On: 03/31/2018 16:02    ASSESSMENT: Iron deficiency anemia  PLAN:    1. Iron deficiency anemia: Likely secondary to continued heavy menses.  Patient reports she cannot tolerate oral iron supplementation.  Patient's hemoglobin continues to be within normal limits at 12.7.  Her iron panel is also within normal limits except for a mildly decreased iron saturation of 13%, but this is improved over previous.  No intervention is needed at this time.  Patient does not require additional IV Feraheme.  Patient last received treatment on January 05, 2018.  Return to clinic in 3 months with repeat laboratory work and further evaluation.  Patient receives her labs at [facility]. 2.  Low MCV: Resolved.  Hemoglobinopathy profile within normal limits.  3.  Thrombocytosis: Resolved.   I spent a total of 20 minutes face-to-face  with the patient of which greater than 50% of the visit was spent in counseling and coordination of care as detailed above.   Patient expressed understanding and was in agreement with this plan. She also understands that She can call clinic at any time with any questions, concerns, or complaints.    Lloyd Huger, MD 04/13/18 11:50 AM

## 2018-04-13 ENCOUNTER — Inpatient Hospital Stay: Payer: BLUE CROSS/BLUE SHIELD | Attending: Oncology | Admitting: Oncology

## 2018-04-13 ENCOUNTER — Encounter: Payer: Self-pay | Admitting: Oncology

## 2018-04-13 ENCOUNTER — Inpatient Hospital Stay: Payer: BLUE CROSS/BLUE SHIELD

## 2018-04-13 VITALS — BP 136/95 | HR 71 | Temp 98.2°F | Resp 16 | Wt 204.8 lb

## 2018-04-13 DIAGNOSIS — E119 Type 2 diabetes mellitus without complications: Secondary | ICD-10-CM | POA: Diagnosis not present

## 2018-04-13 DIAGNOSIS — Z79899 Other long term (current) drug therapy: Secondary | ICD-10-CM | POA: Insufficient documentation

## 2018-04-13 DIAGNOSIS — D509 Iron deficiency anemia, unspecified: Secondary | ICD-10-CM | POA: Insufficient documentation

## 2018-04-13 DIAGNOSIS — I10 Essential (primary) hypertension: Secondary | ICD-10-CM | POA: Diagnosis not present

## 2018-04-13 DIAGNOSIS — D500 Iron deficiency anemia secondary to blood loss (chronic): Secondary | ICD-10-CM

## 2018-04-13 NOTE — Progress Notes (Signed)
Pt denies any difficulties or concerns today.  

## 2018-06-12 ENCOUNTER — Encounter: Payer: Self-pay | Admitting: Oncology

## 2018-06-29 ENCOUNTER — Telehealth: Payer: BLUE CROSS/BLUE SHIELD | Admitting: Nurse Practitioner

## 2018-06-29 DIAGNOSIS — Z7189 Other specified counseling: Secondary | ICD-10-CM

## 2018-06-29 NOTE — Progress Notes (Signed)
E-Visit for Corona Virus Screening  Based on your current symptoms, it seems unlikely that your symptoms are related to the Coronavirus.   Coronavirus disease 2019 (COVID-19) is a respiratory illness that can spread from person to person. The virus that causes COVID-19 is a new virus that was first identified in the country of [place] but is now found in multiple other countries and has spread to the [place].  Symptoms associated with the virus are mild to severe fever, cough, and shortness of breath. There is currently no vaccine to protect against COVID-19, and there is no specific antiviral treatment for the virus.   To be considered HIGH RISK for Coronavirus (COVID-19), you have to meet the following criteria:  . Traveled to [place], [place], [place], [place] or [place]; or in the [place] to [place], [place], [place], or [place]; and have fever, cough, and shortness of breath within the last 2 weeks of travel OR  . Been in close contact with a person diagnosed with COVID-19 within the last 2 weeks and have fever, cough, and shortness of breath  . IF YOU DO NOT MEET THESE CRITERIA, YOU ARE CONSIDERED LOW RISK FOR COVID-19.   It is vitally important that if you feel that you have an infection such as this virus or any other virus that you stay home and away from places where you may spread it to others.  You should self-quarantine for 14 days if you have symptoms that could potentially be coronavirus and avoid contact with people age 65 and older.   You can use medication such as delsym or mucinex OTC for cough  You may also take acetaminophen (Tylenol) as needed for fever.   Reduce your risk of any infection by using the same precautions used for avoiding the common cold or flu:  . Wash your hands often with soap and warm water for at least 20 seconds.  If soap and water are not readily available, use an alcohol-based hand sanitizer with at least 60% alcohol.  . If coughing or  sneezing, cover your mouth and nose by coughing or sneezing into the elbow areas of your shirt or coat, into a tissue or into your sleeve (not your hands). . Avoid shaking hands with others and consider head nods or verbal greetings only. . Avoid touching your eyes, nose, or mouth with unwashed hands.  . Avoid close contact with people who are sick. . Avoid places or events with large numbers of people in one location, like concerts or sporting events. . Carefully consider travel plans you have or are making. . If you are planning any travel outside or inside the [place], visit the CDC's Travelers' Health webpage for the latest health notices. . If you have some symptoms but not all symptoms, continue to monitor at home and seek medical attention if your symptoms worsen. . If you are having a medical emergency, call 911.  HOME CARE . Only take medications as instructed by your medical team. . Drink plenty of fluids and get plenty of rest. . A steam or ultrasonic humidifier can help if you have congestion.   GET HELP RIGHT AWAY IF: . You develop worsening fever. . You become short of breath . You cough up blood. . Your symptoms become more severe MAKE SURE YOU   Understand these instructions.  Will watch your condition.  Will get help right away if you are not doing well or get worse.  Your e-visit answers   were reviewed by a board certified advanced clinical practitioner to complete your personal care plan.  Depending on the condition, your plan could have included both over the counter or prescription medications.  If there is a problem please reply once you have received a response from your provider. Your safety is important to us.  If you have drug allergies check your prescription carefully.    You can use MyChart to ask questions about today's visit, request a non-urgent call back, or ask for a work or school excuse for 24 hours related to this e-Visit. If it has been greater than 24  hours you will need to follow up with your provider, or enter a new e-Visit to address those concerns. You will get an e-mail in the next two days asking about your experience.  I hope that your e-visit has been valuable and will speed your recovery. Thank you for using e-visits.   5 minutes spent reviewing and documenting in chart.  

## 2018-07-06 ENCOUNTER — Telehealth: Payer: Self-pay

## 2018-07-06 NOTE — Telephone Encounter (Addendum)
Contacted patient to inquire on any potential symptoms. Patient denies any symptoms at this time. Since patient states she is feeling okay, advised her Dr. Mike Gip would like to reschedule her appt. 1 month from scheduled date d/t Coronavirus. Patient verbalizes understanding and denies any questions. Advised Shirlean Mylar would reach out to reschedule.

## 2018-07-10 ENCOUNTER — Ambulatory Visit: Payer: BLUE CROSS/BLUE SHIELD | Admitting: Physician Assistant

## 2018-07-13 ENCOUNTER — Ambulatory Visit: Payer: BLUE CROSS/BLUE SHIELD | Admitting: Hematology and Oncology

## 2018-07-13 ENCOUNTER — Ambulatory Visit: Payer: BLUE CROSS/BLUE SHIELD

## 2018-07-20 ENCOUNTER — Telehealth: Payer: BLUE CROSS/BLUE SHIELD | Admitting: Family

## 2018-07-20 DIAGNOSIS — M79672 Pain in left foot: Principal | ICD-10-CM

## 2018-07-20 DIAGNOSIS — M79671 Pain in right foot: Secondary | ICD-10-CM

## 2018-07-20 NOTE — Progress Notes (Signed)
Based on what you shared with me, I feel your condition warrants further evaluation and I recommend that you be seen for a face to face office visit.     NOTE: If you entered your credit card information for this eVisit, you will not be charged. You may see a "hold" on your card for the $35 but that hold will drop off and you will not have a charge processed.  If you are having a true medical emergency please call 911.  If you need an urgent face to face visit, [facility] has four urgent care centers for your convenience.    PLEASE NOTE: THE [facility] LOCATIONS AND URGENT CARE CLINICS DO NOT HAVE THE TESTING FOR CORONAVIRUS COVID19 AVAILABLE.  IF YOU FEEL YOU NEED THIS TEST YOU MUST GO TO A TRIAGE LOCATION AT ONE OF THE HOSPITAL EMERGENCY DEPARTMENTS   [email or web address] to reserve your spot online an avoid wait times  [facility] [address], [address] [place], [place] [address] Modified hours of operation: Monday-Friday, 10 AM to 6 PM  Saturday & Sunday 10 AM to 4 PM *Across the street from [employer]  [facility] (New Address!) [address], [address] [place], [place] [address] *Just off [address], across the road from [employer]* Modified hours of operation: Monday-Friday, 10 AM to 5 PM  Closed Saturday & Sunday   The following sites will take your insurance:  . [facility]  [phone] Get Driving Directions Find a Provider at this Location  [address] , [place] [address] . 10 am to 8 pm Monday-Friday . 12 pm to 8 pm Saturday-Sunday   . [facility]  [phone] Get Driving Directions Find a Provider at this Location  [address], [address] [place], [place] [address] . 8 am to 8 pm Monday-Friday . 9 am to 6 pm Saturday . 11 am to 6 pm Sunday   . [facility] [place]  [phone] Get Driving Directions  [address] [place], [place] [address] . 8 am to 8 pm Monday-Friday . 8 am to 4 pm Saturday-Sunday   Your e-visit answers were reviewed by a board certified advanced clinical practitioner to complete your personal care plan.  Thank you for using e-Visits. 

## 2018-07-22 ENCOUNTER — Telehealth: Payer: 59 | Admitting: Nurse Practitioner

## 2018-07-22 DIAGNOSIS — M545 Low back pain, unspecified: Secondary | ICD-10-CM

## 2018-07-22 MED ORDER — NAPROXEN 500 MG PO TABS: 500.0000 mg | ORAL_TABLET | Freq: Two times a day (BID) | ORAL | 1 refills | Status: DC

## 2018-07-22 MED ORDER — CYCLOBENZAPRINE HCL 10 MG PO TABS: 10.0000 mg | ORAL_TABLET | Freq: Three times a day (TID) | ORAL | 1 refills | Status: DC | PRN

## 2018-07-22 NOTE — Progress Notes (Signed)

## 2018-08-08 ENCOUNTER — Inpatient Hospital Stay: Payer: 59

## 2018-08-09 ENCOUNTER — Ambulatory Visit: Payer: BLUE CROSS/BLUE SHIELD | Admitting: Hematology and Oncology

## 2018-08-09 ENCOUNTER — Ambulatory Visit: Payer: BLUE CROSS/BLUE SHIELD

## 2018-08-10 ENCOUNTER — Ambulatory Visit: Payer: BLUE CROSS/BLUE SHIELD

## 2018-08-10 ENCOUNTER — Ambulatory Visit: Payer: BLUE CROSS/BLUE SHIELD | Admitting: Hematology and Oncology

## 2018-08-13 ENCOUNTER — Inpatient Hospital Stay: Payer: 59 | Attending: Hematology and Oncology

## 2018-08-13 ENCOUNTER — Other Ambulatory Visit: Payer: Self-pay

## 2018-08-13 DIAGNOSIS — E119 Type 2 diabetes mellitus without complications: Secondary | ICD-10-CM | POA: Diagnosis not present

## 2018-08-13 DIAGNOSIS — N920 Excessive and frequent menstruation with regular cycle: Secondary | ICD-10-CM | POA: Diagnosis not present

## 2018-08-13 DIAGNOSIS — I10 Essential (primary) hypertension: Secondary | ICD-10-CM | POA: Diagnosis not present

## 2018-08-13 DIAGNOSIS — D500 Iron deficiency anemia secondary to blood loss (chronic): Secondary | ICD-10-CM

## 2018-08-13 DIAGNOSIS — G4730 Sleep apnea, unspecified: Secondary | ICD-10-CM | POA: Insufficient documentation

## 2018-08-13 DIAGNOSIS — Z791 Long term (current) use of non-steroidal anti-inflammatories (NSAID): Secondary | ICD-10-CM | POA: Insufficient documentation

## 2018-08-13 DIAGNOSIS — Z79899 Other long term (current) drug therapy: Secondary | ICD-10-CM | POA: Diagnosis not present

## 2018-08-13 DIAGNOSIS — K5900 Constipation, unspecified: Secondary | ICD-10-CM | POA: Insufficient documentation

## 2018-08-13 DIAGNOSIS — Z803 Family history of malignant neoplasm of breast: Secondary | ICD-10-CM | POA: Diagnosis not present

## 2018-08-13 DIAGNOSIS — Z7951 Long term (current) use of inhaled steroids: Secondary | ICD-10-CM | POA: Insufficient documentation

## 2018-08-13 DIAGNOSIS — D508 Other iron deficiency anemias: Secondary | ICD-10-CM | POA: Diagnosis not present

## 2018-08-13 DIAGNOSIS — J45909 Unspecified asthma, uncomplicated: Secondary | ICD-10-CM | POA: Diagnosis not present

## 2018-08-13 LAB — CBC WITH DIFFERENTIAL/PLATELET
Abs Immature Granulocytes: 0.01 10*3/uL (ref 0.00–0.07)
Basophils Absolute: 0 10*3/uL (ref 0.0–0.1)
Basophils Relative: 0 %
Eosinophils Absolute: 0.2 10*3/uL (ref 0.0–0.5)
Eosinophils Relative: 4 %
HCT: 35.1 % — ABNORMAL LOW (ref 36.0–46.0)
Hemoglobin: 10.8 g/dL — ABNORMAL LOW (ref 12.0–15.0)
Immature Granulocytes: 0 %
Lymphocytes Relative: 32 %
Lymphs Abs: 1.5 10*3/uL (ref 0.7–4.0)
MCH: 23.9 pg — ABNORMAL LOW (ref 26.0–34.0)
MCHC: 30.8 g/dL (ref 30.0–36.0)
MCV: 77.7 fL — ABNORMAL LOW (ref 80.0–100.0)
Monocytes Absolute: 0.5 10*3/uL (ref 0.1–1.0)
Monocytes Relative: 11 %
Neutro Abs: 2.5 10*3/uL (ref 1.7–7.7)
Neutrophils Relative %: 53 %
Platelets: 446 10*3/uL — ABNORMAL HIGH (ref 150–400)
RBC: 4.52 MIL/uL (ref 3.87–5.11)
RDW: 14.7 % (ref 11.5–15.5)
WBC: 4.8 10*3/uL (ref 4.0–10.5)
nRBC: 0 % (ref 0.0–0.2)

## 2018-08-13 LAB — COMPREHENSIVE METABOLIC PANEL
ALT: 13 U/L (ref 0–44)
AST: 16 U/L (ref 15–41)
Albumin: 3.9 g/dL (ref 3.5–5.0)
Alkaline Phosphatase: 61 U/L (ref 38–126)
Anion gap: 5 (ref 5–15)
BUN: 13 mg/dL (ref 6–20)
CO2: 28 mmol/L (ref 22–32)
Calcium: 8.8 mg/dL — ABNORMAL LOW (ref 8.9–10.3)
Chloride: 103 mmol/L (ref 98–111)
Creatinine, Ser: 0.7 mg/dL (ref 0.44–1.00)
GFR calc Af Amer: >60 mL/min (ref >60)
GFR calc non Af Amer: >60 mL/min (ref >60)
Glucose, Bld: 121 mg/dL — ABNORMAL HIGH (ref 70–99)
Potassium: 3.7 mmol/L (ref 3.5–5.1)
Sodium: 136 mmol/L (ref 135–145)
Total Bilirubin: 0.3 mg/dL (ref 0.3–1.2)
Total Protein: 7.6 g/dL (ref 6.5–8.1)

## 2018-08-13 LAB — IRON AND TIBC
Iron: 20 ug/dL — ABNORMAL LOW (ref 28–170)
Saturation Ratios: 4 % — ABNORMAL LOW (ref 10.4–31.8)
TIBC: 460 ug/dL — ABNORMAL HIGH (ref 250–450)
UIBC: 440 ug/dL

## 2018-08-13 LAB — FERRITIN: Ferritin: 6 ng/mL — ABNORMAL LOW (ref 11–307)

## 2018-08-13 NOTE — Progress Notes (Signed)
[facility]  [address], [address] [place], [place] [address] Phone: [phone]  Fax: [phone]   Clinic Day:  08/14/2018  Referring physician: Karen Kitchens, MD  Chief Complaint: Donna Reyes is a 43 y.o. female with iron deficiency anemia and reactive thrombocytosis who is seen for new patient assessment.  HPI:  The patient was diagnosed with iron deficiency anemia and reactive thrombocytosis in 2015 during a hospitalization for weakness and fatigue. She was given 1 unit of PRBCs and 1 dose of IV iron at that time. She was put on Integra, and when she stopped taking it, her counts dropped significantly. At that time, her menses typically lasted 7 days, with the first 4 days very heavy. She soaked 3 pads per hour. She was referred to hematology after blood work on 11/16/2015 showed hemoglobin 7.6, hematocrit 27.4,and platelets 585,000. Iron saturation was 12%.  She was intolerant of oral iron therapy due to severe abdominal pain.  She was initially seen in the hematology clinic on 12/30/2015 by Dr. Delight Hoh.  CBC revealed a hematocrit of 27.4, hemoglobin 8.0, MCV 60.3, platelets 391,000, and WBC 4400.  Ferritin was 4 with an iron saturation of 2% and a TIBC of 499.   B12 and folate were normal.  Coombs and ANA was negative.  Hemoglobin electrophoresis was normal.  She received 510mg  IV Feraheme x 2.   She received Feraheme on 01/08/2016, 01/15/2016, 11/10/2017, 11/17/2017, and 01/05/2018.   Ferritin has been followed: 4 on 01/01/2016, 33 on 02/29/2016, 4 on 10/31/2017, 16 on 01/04/2018, 9 on 06/11/2018, and 6 on 08/13/2018.   She has a history of an ulcer since she was 43yo. She underwent an EGD in 2015 (report unavailable) in [place]. She was previously on treatment with Nexium, followed by Protonix. She was weaned off several years ago and hasn't been on any treatment in several years. She denies any symptoms. She has never undergone a  colonoscopy.   She has a history of uterine fibroids. Transvaginal US on 05/06/2014 showed a 40mm subserosal uterine fibroid and 4cm cystic lesion on the left ovary favoring a hemorragic cyst. Transvaginal US on 01/10/2018 showed 2 fibroids measuring 1.3-1.8cm. She has not undergone a hysterectomy, but reports a history of tubal ligation.   The patient was last seen in the hematology clinic on 04/13/2018 by Dr. Grayland Ormond. At that time, she was asymptomatic. She denied weakness or fatigue. She did not require IV Feraheme at that time.   CBC on 08/13/2018: hemoglobin 10.8, hematocrit 35.1, platelets 446,000. Calcium was 8.8. Iron saturation was 4%. Ferritin was 6.   During the interim, she reports "I feel pretty good." She has ice pica 2-3x weekly. She is mildly fatigued. Denies restless legs. She is currently menstruating.   She is constipated 1-2x per month. She reports intermittent right hip pain.   She had a COVID-19 test last week, after experiencing fever, body aches, and sore throat.  Testing was negative.  Menses are currently heavy, lasting 5 days, with 2 heavy days, where she soaks 2 pads per hour. On the 3 light days she goes through a pad every 3-4 hours. She occasionally gets dizzy and lightheaded while having her period, especially with exertion. She denies heavy bruising or bleeding otherwise. She has never been tested for a bleeding disorder.  She denies prior history of bleeding issues during surgeries. She has had 3 C-sections.   She sees her OBGYN annually. She will see her new PCP, [name],  NP, at [facility] on 08/22/2018 for a full work-up.   She denies ever seeing blood in her stools or black stools. She denies blood in her urine other than with a UTI.   Her diet consists of "lots of fruit and vegetable, 2-3x daily." She does not eat fried food. She has 2 servings of meat per week and 4 servings of green leafy vegetables per week. She eats lots of fish and shrimp.    She has a family history of anemia in her mother and sister.  She had a lump in her breast in 2019 and a swollen right axillary lymph node, for which she saw Dr. Bary Castilla.  Biopsies on 05/25/2017 were negative for malignancy.   Pathology revealed lymphadenitis.  She denies any B symptoms.   Past Medical History:  Diagnosis Date  . Allergy   . Anemia   . Asthma   . Diabetes mellitus without complication ([place])   . Hypertension   . Sleep apnea     Past Surgical History:  Procedure Laterality Date  . AXILLARY LYMPH NODE BIOPSY Right 05/25/2017   LYMPHADENITIS. NEGATIVE FOR MALIGNANCY.   Marland Kitchen BREAST BIOPSY Right 05/25/2017   neg/NEGATIVE FOR CARCINOMA. INFLAMMATION AND SQUAMOUS METAPLASIA OF A FOCAL DUCT   . CESAREAN SECTION     times 3  . TUBAL LIGATION      Family History  Problem Relation Age of Onset  . Diabetes Mother   . Diabetes Maternal Grandmother   . Cancer Maternal Grandmother   . Breast cancer Maternal Grandmother 28  . Hypertension Father   . Hyperlipidemia Father   . Ovarian cancer Neg Hx   . Colon cancer Neg Hx     Social History:  reports that she has never smoked. She has never used smokeless tobacco. She reports that she does not drink alcohol or use drugs. She previously lived in [place]. She moved to [place] in 2015 to be near her husband's family. She is now divorced. She does not smoke or drink. She denies exposure to radiation or toxins. She is an Corporate treasurer at [employer]. The patient is alone today.  Allergies:  Allergies  Allergen Reactions  . Lisinopril     Current Medications: Current Outpatient Medications  Medication Sig Dispense Refill  . albuterol (PROVENTIL) (2.5 MG/3ML) 0.083% nebulizer solution Take 2.5 mg by nebulization every 6 (six) hours as needed for wheezing or shortness of breath.    Marland Kitchen amLODipine (NORVASC) 5 MG tablet Take 5 mg by mouth daily.   10  . cyclobenzaprine (FLEXERIL) 10 MG tablet Take 1 tablet (10 mg total) by  mouth 3 (three) times daily as needed for muscle spasms. 30 tablet 1  . meloxicam (MOBIC) 15 MG tablet Take 1 tablet (15 mg total) by mouth daily as needed. 30 tablet 0  . naproxen (NAPROSYN) 500 MG tablet Take 1 tablet (500 mg total) by mouth 2 (two) times daily with a meal. 60 tablet 1   No current facility-administered medications for this visit.    Facility-Administered Medications Ordered in Other Visits  Medication Dose Route Frequency Provider Last Rate Last Dose  . 0.9 %  sodium chloride infusion   Intravenous Once Lloyd Huger, MD      . ferumoxytol [facility]) 510 mg in sodium chloride 0.9 % 100 mL IVPB  510 mg Intravenous Once Lloyd Huger, MD        Review of Systems  Constitutional: Positive for malaise/fatigue. Negative for chills, diaphoresis, fever  and weight loss.       "I feel pretty good."  HENT: Negative for congestion, hearing loss, nosebleeds, sinus pain and sore throat.   Eyes: Negative for blurred vision and double vision.  Respiratory: Negative for cough, shortness of breath and wheezing.   Cardiovascular: Negative for chest pain, palpitations, orthopnea, leg swelling and PND.  Gastrointestinal: Positive for constipation (1-2x monthly). Negative for abdominal pain, blood in stool, diarrhea, heartburn, melena, nausea and vomiting.       Ice pica 2-3x weekly.  Genitourinary: Negative for dysuria, frequency, hematuria and urgency.       Heavy menses.  Musculoskeletal: Positive for joint pain (right hip, intermittent). Negative for back pain and myalgias.  Skin: Negative for itching and rash.  Neurological: Negative for dizziness, sensory change, weakness and headaches.  Endo/Heme/Allergies: Does not bruise/bleed easily.  Psychiatric/Behavioral: Negative for depression and memory loss. The patient is not nervous/anxious and does not have insomnia.    Performance status (ECOG): 1  Physical Exam  Constitutional: She is oriented to person, place, and  time. She appears well-developed and well-nourished. No distress.  HENT:  Head: Normocephalic and atraumatic.  Mouth/Throat: Oropharynx is clear and moist. No oropharyngeal exudate.  Short, dark hair. Wearing mask.   Eyes: Pupils are equal, round, and reactive to light. Conjunctivae and EOM are normal.  Contacts.  Neck: Normal range of motion. Neck supple.  Cardiovascular: Normal rate and regular rhythm. Exam reveals no gallop and no friction rub.  No murmur heard. Pulmonary/Chest: Effort normal and breath sounds normal. No respiratory distress. She has no wheezes. She has no rales.  Abdominal: Soft. Bowel sounds are normal. She exhibits no distension.  Musculoskeletal: Normal range of motion.        General: No edema.  Lymphadenopathy:    She has no cervical adenopathy.    She has axillary adenopathy (1-2 cm right).       Right: No supraclavicular adenopathy present.       Left: No supraclavicular adenopathy present.  Neurological: She is alert and oriented to person, place, and time.  Skin: Skin is warm and dry. She is not diaphoretic.  Psychiatric: She has a normal mood and affect. Her behavior is normal. Judgment and thought content normal.  Nursing note and vitals reviewed.   Appointment on 08/13/2018  Component Date Value Ref Range Status  . Sodium 08/13/2018 136  135 - 145 mmol/L Final  . Potassium 08/13/2018 3.7  3.5 - 5.1 mmol/L Final  . Chloride 08/13/2018 103  98 - 111 mmol/L Final  . CO2 08/13/2018 28  22 - 32 mmol/L Final  . Glucose, Bld 08/13/2018 121* 70 - 99 mg/dL Final  . BUN 08/13/2018 13  6 - 20 mg/dL Final  . Creatinine, Ser 08/13/2018 0.70  0.44 - 1.00 mg/dL Final  . Calcium 08/13/2018 8.8* 8.9 - 10.3 mg/dL Final  . Total Protein 08/13/2018 7.6  6.5 - 8.1 g/dL Final  . Albumin 08/13/2018 3.9  3.5 - 5.0 g/dL Final  . AST 08/13/2018 16  15 - 41 U/L Final  . ALT 08/13/2018 13  0 - 44 U/L Final  . Alkaline Phosphatase 08/13/2018 61  38 - 126 U/L Final  . Total  Bilirubin 08/13/2018 0.3  0.3 - 1.2 mg/dL Final  . GFR calc non Af Amer 08/13/2018 >60  >60 mL/min Final  . GFR calc Af Amer 08/13/2018 >60  >60 mL/min Final  . Anion gap 08/13/2018 5  5 - 15 Final  Performed at [facility] [address], [place], [place] [address]  . Ferritin 08/13/2018 6* 11 - 307 ng/mL Final   Performed at [facility], [address] [place], [place] [address]  . Iron 08/13/2018 20* 28 - 170 ug/dL Final  . TIBC 08/13/2018 460* 250 - 450 ug/dL Final  . Saturation Ratios 08/13/2018 4* 10.4 - 31.8 % Final  . UIBC 08/13/2018 440  ug/dL Final   Performed at [facility], [address], [place], [place] [address]  . WBC 08/13/2018 4.8  4.0 - 10.5 K/uL Final  . RBC 08/13/2018 4.52  3.87 - 5.11 MIL/uL Final  . Hemoglobin 08/13/2018 10.8* 12.0 - 15.0 g/dL Final  . HCT 08/13/2018 35.1* 36.0 - 46.0 % Final  . MCV 08/13/2018 77.7* 80.0 - 100.0 fL Final  . MCH 08/13/2018 23.9* 26.0 - 34.0 pg Final  . MCHC 08/13/2018 30.8  30.0 - 36.0 g/dL Final  . RDW 08/13/2018 14.7  11.5 - 15.5 % Final  . Platelets 08/13/2018 446* 150 - 400 K/uL Final  . nRBC 08/13/2018 0.0  0.0 - 0.2 % Final  . Neutrophils Relative % 08/13/2018 53  % Final  . Neutro Abs 08/13/2018 2.5  1.7 - 7.7 K/uL Final  . Lymphocytes Relative 08/13/2018 32  % Final  . Lymphs Abs 08/13/2018 1.5  0.7 - 4.0 K/uL Final  . Monocytes Relative 08/13/2018 11  % Final  . Monocytes Absolute 08/13/2018 0.5  0.1 - 1.0 K/uL Final  . Eosinophils Relative 08/13/2018 4  % Final  . Eosinophils Absolute 08/13/2018 0.2  0.0 - 0.5 K/uL Final  . Basophils Relative 08/13/2018 0  % Final  . Basophils Absolute 08/13/2018 0.0  0.0 - 0.1 K/uL Final  . Immature Granulocytes 08/13/2018 0  % Final  . Abs Immature Granulocytes 08/13/2018 0.01  0.00 - 0.07 K/uL Final   Performed at [facility], [address], [place], [place] [address]    Assessment:  Kilyn Maragh is a 43 y.o.  female with iron deficiency anemia and reactive thrombocytosis.  Etiology is felt secondary to menorrhagia.  Diet is fairly good.  She is intolerant of oral iron.   She has a history of an ulcer at a young age.  She had an EGD in 2015 in [place].  Report is unavailable. She is no longer on a PPI.  Work-up on 12/30/2015 revealed a hematocrit of 27.4, hemoglobin 8.0, MCV 60.3, platelets 391,000, and WBC 4400.  Ferritin was 4 with an iron saturation of 2% and a TIBC of 499.   Normal studies included:  B12, folate , Coombs, ANA, and hemoglobin electrophoresis.  She previously received IV iron x 1 while living in [place].  She received Feraheme on 01/08/2016, 01/15/2016, 11/10/2017, 11/17/2017, and 01/05/2018.   Ferritin has been followed: 4 on 01/01/2016, 33 on 02/29/2016, 4 on 10/31/2017, 16 on 01/04/2018, 9 on 06/11/2018, and 6 on 08/13/2018.   She has a history of right breast lump and adenopathy in 2019.  Biopsies on 05/25/2017 were negative for malignancy.   Pathology revealed lymphadenitis.    Symptomatically, she feels a little tired.  She denies any B symptoms.  She has ice pica.  Exam reveals a palpable right axillary node.  Plan: 1.   Review labs from 08/13/2018. 2.   Iron deficiency anemia  Discuss entire medical history, diagnosis and management of iron deficiency.  Etiology felt secondary to heavy menses.  Patient has a history of  an ulcer.  She is no longer on a PPI.  Discuss reinitiation of IV iron.    Potential side effects reviewed.   Patient consented to treatment. 3.   Bleeding diathesis  Discuss heavy menses.  Encourage follow-up with gynecology.  Discuss r/o von Willebrand's disease with next heavy menses (patient to call for testing). 4.  Right axillary adenopathy  Patient has a history of right breast and axillary adenopathy.  Patient was previously seen by Dr. Bary Castilla.  Pathology revealed lymphadenitis.    Lymph nodes still palpable.  Send clinic note to Dr. Bary Castilla.  5.   Feraheme today 6.   RTC in 3 months for MD assessment, labs (CBC with diff, ferritin- day before), and +/- Feraheme.  I discussed the assessment and treatment plan with the patient.  The patient was provided an opportunity to ask questions and all were answered.  The patient agreed with the plan and demonstrated an understanding of the instructions.  The patient was advised to call back if the symptoms worsen or if the condition fails to improve as anticipated.   Lequita Asal, MD, PhD    08/14/2018, 9:43 AM  I, Molly Dorshimer, am acting as Education administrator for [employer]. Mike Gip, MD, PhD.  I, Melissa C. Mike Gip, MD, have reviewed the above documentation for accuracy and completeness, and I agree with the above.

## 2018-08-14 ENCOUNTER — Inpatient Hospital Stay: Payer: 59

## 2018-08-14 ENCOUNTER — Encounter: Payer: Self-pay | Admitting: Hematology and Oncology

## 2018-08-14 ENCOUNTER — Inpatient Hospital Stay: Payer: 59 | Admitting: Hematology and Oncology

## 2018-08-14 VITALS — BP 129/87 | HR 80 | Temp 97.5°F | Resp 16 | Wt 209.4 lb

## 2018-08-14 VITALS — BP 137/91 | HR 76 | Resp 16

## 2018-08-14 DIAGNOSIS — E119 Type 2 diabetes mellitus without complications: Secondary | ICD-10-CM

## 2018-08-14 DIAGNOSIS — Z7951 Long term (current) use of inhaled steroids: Secondary | ICD-10-CM | POA: Diagnosis not present

## 2018-08-14 DIAGNOSIS — D508 Other iron deficiency anemias: Secondary | ICD-10-CM

## 2018-08-14 DIAGNOSIS — I10 Essential (primary) hypertension: Secondary | ICD-10-CM | POA: Diagnosis not present

## 2018-08-14 DIAGNOSIS — K5900 Constipation, unspecified: Secondary | ICD-10-CM

## 2018-08-14 DIAGNOSIS — J45909 Unspecified asthma, uncomplicated: Secondary | ICD-10-CM | POA: Diagnosis not present

## 2018-08-14 DIAGNOSIS — G4730 Sleep apnea, unspecified: Secondary | ICD-10-CM

## 2018-08-14 DIAGNOSIS — N920 Excessive and frequent menstruation with regular cycle: Secondary | ICD-10-CM

## 2018-08-14 DIAGNOSIS — Z803 Family history of malignant neoplasm of breast: Secondary | ICD-10-CM

## 2018-08-14 DIAGNOSIS — R599 Enlarged lymph nodes, unspecified: Secondary | ICD-10-CM

## 2018-08-14 DIAGNOSIS — Z79899 Other long term (current) drug therapy: Secondary | ICD-10-CM

## 2018-08-14 DIAGNOSIS — D500 Iron deficiency anemia secondary to blood loss (chronic): Secondary | ICD-10-CM

## 2018-08-14 DIAGNOSIS — Z791 Long term (current) use of non-steroidal anti-inflammatories (NSAID): Secondary | ICD-10-CM | POA: Diagnosis not present

## 2018-08-14 HISTORY — DX: Excessive and frequent menstruation with regular cycle: N92.0

## 2018-08-14 MED ORDER — SODIUM CHLORIDE 0.9 % IV SOLN
Freq: Once | INTRAVENOUS | Status: AC
  Administered 2018-08-14: 10:00:00 via INTRAVENOUS
  Filled 2018-08-14: qty 250

## 2018-08-14 MED ORDER — SODIUM CHLORIDE 0.9 % IV SOLN
510.0000 mg | Freq: Once | INTRAVENOUS | Status: AC
  Administered 2018-08-14: 510 mg via INTRAVENOUS
  Filled 2018-08-14: qty 17

## 2018-08-14 NOTE — Progress Notes (Signed)
Pt here for follow up. Denies any concerns.  

## 2018-08-17 DIAGNOSIS — R599 Enlarged lymph nodes, unspecified: Secondary | ICD-10-CM | POA: Insufficient documentation

## 2018-08-24 ENCOUNTER — Ambulatory Visit: Payer: 59 | Admitting: Family Medicine

## 2018-08-24 ENCOUNTER — Other Ambulatory Visit: Payer: Self-pay

## 2018-08-24 ENCOUNTER — Encounter: Payer: Self-pay | Admitting: Family Medicine

## 2018-08-24 VITALS — BP 104/72 | HR 97 | Temp 98.1°F | Resp 12 | Ht 63.0 in | Wt 211.1 lb

## 2018-08-24 DIAGNOSIS — D500 Iron deficiency anemia secondary to blood loss (chronic): Secondary | ICD-10-CM | POA: Diagnosis not present

## 2018-08-24 DIAGNOSIS — Z13220 Encounter for screening for lipoid disorders: Secondary | ICD-10-CM | POA: Diagnosis not present

## 2018-08-24 DIAGNOSIS — Z1159 Encounter for screening for other viral diseases: Secondary | ICD-10-CM | POA: Diagnosis not present

## 2018-08-24 DIAGNOSIS — E11620 Type 2 diabetes mellitus with diabetic dermatitis: Secondary | ICD-10-CM | POA: Diagnosis not present

## 2018-08-24 DIAGNOSIS — R222 Localized swelling, mass and lump, trunk: Secondary | ICD-10-CM

## 2018-08-24 DIAGNOSIS — Z114 Encounter for screening for human immunodeficiency virus [HIV]: Secondary | ICD-10-CM | POA: Diagnosis not present

## 2018-08-24 DIAGNOSIS — D473 Essential (hemorrhagic) thrombocythemia: Secondary | ICD-10-CM
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
## 2018-08-25 ENCOUNTER — Telehealth: Payer: 59 | Admitting: Physician Assistant

## 2018-08-25 DIAGNOSIS — B9689 Other specified bacterial agents as the cause of diseases classified elsewhere: Secondary | ICD-10-CM | POA: Diagnosis not present

## 2018-08-25 DIAGNOSIS — N760 Acute vaginitis: Secondary | ICD-10-CM | POA: Diagnosis not present

## 2018-08-25 MED ORDER — METRONIDAZOLE 500 MG PO TABS: 500.0000 mg | ORAL_TABLET | Freq: Two times a day (BID) | ORAL | 0 refills | Status: DC

## 2018-08-25 NOTE — Progress Notes (Signed)

## 2018-08-25 NOTE — Progress Notes (Signed)
I have spent 5 minutes in review of e-visit questionnaire, review and updating patient chart, medical decision making and response to patient.   Armoni Depass Cody Lee-Ann Gal, PA-C    

## 2018-08-26 ENCOUNTER — Encounter: Payer: Self-pay | Admitting: Family Medicine

## 2018-08-26 DIAGNOSIS — E11620 Type 2 diabetes mellitus with diabetic dermatitis: Secondary | ICD-10-CM

## 2018-08-28 LAB — COMPLETE METABOLIC PANEL WITH GFR
AG Ratio: 1.5 (calc) (ref 1.0–2.5)
ALT: 15 U/L (ref 6–29)
AST: 14 U/L (ref 10–30)
Albumin: 4.5 g/dL (ref 3.6–5.1)
Alkaline phosphatase (APISO): 56 U/L (ref 31–125)
BUN: 13 mg/dL (ref 7–25)
CO2: 30 mmol/L (ref 20–32)
Calcium: 9.7 mg/dL (ref 8.6–10.2)
Chloride: 101 mmol/L (ref 98–110)
Creat: 0.69 mg/dL (ref 0.50–1.10)
GFR, Est African American: 123 mL/min/{1.73_m2} (ref >=60)
GFR, Est Non African American: 106 mL/min/{1.73_m2} (ref >=60)
Globulin: 3.1 g/dL (calc) (ref 1.9–3.7)
Glucose, Bld: 113 mg/dL — ABNORMAL HIGH (ref 65–99)
Potassium: 4.4 mmol/L (ref 3.5–5.3)
Sodium: 137 mmol/L (ref 135–146)
Total Bilirubin: 0.4 mg/dL (ref 0.2–1.2)
Total Protein: 7.6 g/dL (ref 6.1–8.1)

## 2018-08-28 LAB — MICROALBUMIN / CREATININE URINE RATIO
Creatinine, Urine: 178 mg/dL (ref 20–275)
Microalb Creat Ratio: 8 mcg/mg creat (ref <30)
Microalb, Ur: 1.4 mg/dL

## 2018-08-28 LAB — LIPID PANEL
Cholesterol: 211 mg/dL — ABNORMAL HIGH (ref <200)
HDL: 40 mg/dL — ABNORMAL LOW (ref >=50)
LDL Cholesterol (Calc): 153 mg/dL (calc) — ABNORMAL HIGH
Non-HDL Cholesterol (Calc): 171 mg/dL (calc) — ABNORMAL HIGH (ref <130)
Total CHOL/HDL Ratio: 5.3 (calc) — ABNORMAL HIGH (ref <5.0)
Triglycerides: 77 mg/dL (ref <150)

## 2018-08-28 LAB — HEMOGLOBIN A1C
Hgb A1c MFr Bld: 6.5 % of total Hgb — ABNORMAL HIGH (ref <5.7)
Mean Plasma Glucose: 140 (calc)
eAG (mmol/L): 7.7 (calc)

## 2018-08-28 LAB — TSH: TSH: 2.45 mIU/L

## 2018-08-28 LAB — HIV ANTIBODY (ROUTINE TESTING W REFLEX): HIV 1&2 Ab, 4th Generation: NONREACTIVE

## 2018-08-28 LAB — HEPATITIS C ANTIBODY
Hepatitis C Ab: NONREACTIVE
SIGNAL TO CUT-OFF: 0.01 (ref <1.00)

## 2018-08-28 MED ORDER — SEMAGLUTIDE(0.25 OR 0.5MG/DOS) 2 MG/1.5ML ~~LOC~~ SOPN: PEN_INJECTOR | SUBCUTANEOUS | 1 refills | Status: AC

## 2018-08-28 MED ORDER — INSULIN PEN NEEDLE 32G X 6 MM MISC: 1.0000 | 1 refills | Status: DC

## 2018-08-31 ENCOUNTER — Ambulatory Visit: Payer: Self-pay

## 2018-08-31 DIAGNOSIS — E11620 Type 2 diabetes mellitus with diabetic dermatitis: Secondary | ICD-10-CM

## 2018-08-31 NOTE — Chronic Care Management (AMB) (Signed)
  Care Management   Note  08/31/2018 Name: Donna Reyes MRN: [id number] DOB: Aug 21, 1975  Donna Reyes is a 43 year old female who sees [name], NFP for primary care. Donna Reyes asked the CCM team to consult the patient for case management secondary to her need for DM education. Telephone outreach to patient today to introduce CCM services.  SDOH (Social Determinants of Health) screening performed today. See Care Plan Entry related to challenges with: None  Donna Reyes was given information about Care Management services today including:  1. Case Management services include personalized support from designated clinical staff supervised by a physician, including individualized plan of care and coordination with other care providers 2. 24/7 contact phone numbers for assistance for urgent and routine care needs. 3. The patient may stop CCM services at any time (effective at the end of the month) by phone call to the office staff.   Patient agreed to services and verbal consent obtained, however as a [place], patient will be better served utilizing [place] or [facility] [facility] Management. Patient was provided contact information for these programs and also contact information for CCM RN CM incase additional needs arise.    Plan: Will close referral for now    Donna Lafoy E. Rollene Rotunda, RN, BSN Nurse Care Coordinator [facility] Care Management  [phone]

## 2018-09-07 ENCOUNTER — Encounter: Payer: Self-pay | Admitting: Family Medicine

## 2018-09-07 MED ORDER — METFORMIN HCL 500 MG PO TABS: 500.0000 mg | ORAL_TABLET | Freq: Every day | ORAL | 1 refills | Status: DC

## 2018-09-10 ENCOUNTER — Ambulatory Visit: Payer: 59 | Admitting: Podiatry

## 2018-09-10 ENCOUNTER — Other Ambulatory Visit: Payer: Self-pay

## 2018-09-10 ENCOUNTER — Encounter: Payer: Self-pay | Admitting: Podiatry

## 2018-09-10 VITALS — Temp 98.0°F

## 2018-09-10 DIAGNOSIS — E119 Type 2 diabetes mellitus without complications: Secondary | ICD-10-CM | POA: Diagnosis not present

## 2018-09-10 NOTE — Progress Notes (Signed)
This patient presents to the office with chief complaint of heel callus  and diabetic feet.  This patient  says there  is  no pain and discomfort in her  feet.  This patient says there is callus on both heels especially her left heel.  Patient has been applying cream to her left hee for the last week.  Patient has no history of infection or drainage from both feet.   This patient presents  to the office today for treatment of the  Callus  and a foot evaluation due to history of  diabetes.  General Appearance  Alert, conversant and in no acute stress.  Vascular  Dorsalis pedis and posterior tibial  pulses are palpable  bilaterally.  Capillary return is within normal limits  bilaterally. Temperature is within normal limits  bilaterally.  Neurologic  Senn-Weinstein monofilament wire test within normal limits  bilaterally. Muscle power within normal limits bilaterally.  Nails Normal nails noted with no evidence of fungal or bacterial infection.  Orthopedic  No limitations of motion of motion feet .  No crepitus or effusions noted.  No bony pathology or digital deformities noted.  Skin  normotropic skin with no porokeratosis noted bilaterally.  No signs of infections or ulcers noted.   Mild heel callus lateral aspect left foot.    Diabetes with no foot complications  IE   A diabetic foot exam was performed and there is no evidence of any vascular or neurologic pathology.   Told her to use vaseline and/or chapstick.   Gardiner Barefoot DPM

## 2018-09-13 ENCOUNTER — Ambulatory Visit: Payer: 59 | Admitting: General Surgery

## 2018-09-13 ENCOUNTER — Encounter: Payer: BLUE CROSS/BLUE SHIELD | Admitting: Obstetrics and Gynecology

## 2018-09-18 ENCOUNTER — Encounter: Payer: Self-pay | Admitting: Family Medicine

## 2018-09-18 DIAGNOSIS — E11620 Type 2 diabetes mellitus with diabetic dermatitis: Secondary | ICD-10-CM

## 2018-09-18 MED ORDER — BLOOD GLUCOSE MONITOR KIT: PACK | 0 refills | Status: DC

## 2018-09-20 ENCOUNTER — Ambulatory Visit: Payer: 59 | Admitting: General Surgery

## 2018-09-21 NOTE — Progress Notes (Signed)
Greater than 5 minutes, yet less than 10 minutes of time have been spent researching, coordinating, and implementing care for this patient today.  Thank you for the details you included in the comment boxes. Those details are very helpful in determining the best course of treatment for you and help us to provide the best care.  

## 2018-09-24 ENCOUNTER — Encounter: Payer: Self-pay | Admitting: Family Medicine

## 2018-09-24 ENCOUNTER — Ambulatory Visit (INDEPENDENT_AMBULATORY_CARE_PROVIDER_SITE_OTHER)
Admission: RE | Admit: 2018-09-24 | Discharge: 2018-09-24 | Disposition: A | Discharge status: Home / Self Care | Payer: 59 | Source: Ambulatory Visit

## 2018-09-24 DIAGNOSIS — R062 Wheezing: Secondary | ICD-10-CM | POA: Diagnosis not present

## 2018-09-24 DIAGNOSIS — E11628 Type 2 diabetes mellitus with other skin complications: Secondary | ICD-10-CM

## 2018-09-24 DIAGNOSIS — R059 Cough, unspecified: Secondary | ICD-10-CM

## 2018-09-24 DIAGNOSIS — R05 Cough: Secondary | ICD-10-CM | POA: Diagnosis not present

## 2018-09-24 DIAGNOSIS — R0602 Shortness of breath: Secondary | ICD-10-CM | POA: Diagnosis not present

## 2018-09-24 MED ORDER — PREDNISONE 10 MG PO TABS: 20.0000 mg | ORAL_TABLET | Freq: Every day | ORAL | 0 refills | Status: AC

## 2018-09-24 MED ORDER — CETIRIZINE HCL 10 MG PO TABS: 10.0000 mg | ORAL_TABLET | Freq: Every day | ORAL | 0 refills | Status: DC

## 2018-09-24 NOTE — Discharge Instructions (Signed)
You may be having an acute asthma flare due to allergies Take the Zyrtec daily as prescribed Continue using your inhaler and nebulizer as needed I am sending in a low-dose short burst of steroids to help with lung inflammation Make sure you are checking your blood sugars while taking the prednisone and drinking plenty of water.  Avoid sugary foods and drinks. If your symptoms continue or worsen you will need to be seen in person. I am giving you a note to stay out of work until your COVID results come back.

## 2018-09-24 NOTE — ED Provider Notes (Addendum)
Virtual Visit via Video Note:  Donna Reyes  initiated request for Telemedicine visit with [facility] Urgent Care team. I connected with Donna Reyes  on 09/24/2018 at 10:43 AM  for a synchronized telemedicine visit using a video enabled HIPPA compliant telemedicine application. I verified that I am speaking with Donna Reyes  using two identifiers. Orvan July, NP  was physically located in a [facility] Urgent care site and Donna Reyes was located at a different location.   The limitations of evaluation and management by telemedicine as well as the availability of in-person appointments were discussed. Patient was informed that she  may incur a bill ( including co-pay) for this virtual visit encounter. Donna Reyes  expressed understanding and gave verbal consent to proceed with virtual visit.     History of Present Illness:Donna Reyes  is a 43 y.o. female with past medical history of allergy, anemia, asthma, diabetes, hypertension presents with shortness of breath, cough, wheezing.  This is been going on for the past couple of days.  She woke up this morning with worsening symptoms.  Past medical history of asthma.  She has been using her inhalers with some relief.  Denies any fever or known COVID exposures.  Patient works for [employer].  Denies any recent long distance traveling or history of PE or DVT.  No calf pain or swelling.  Typically gets seasonal allergies but has not been taking any allergy medicine.  Patient already tested for COVID, pending results  Past Medical History:  Diagnosis Date  . Allergy   . Anemia   . Asthma   . Diabetes mellitus without complication ([place])   . Hypertension   . Sleep apnea     Allergies  Allergen Reactions  . Lisinopril         Observations/Objective: GENERAL APPEARANCE: Well developed, well nourished, alert and cooperative, and appears to be in no acute distress. HEAD:  normocephalic. Non labored breathing, no dyspnea or distress Skin: Skin normal color  PSYCHIATRIC: The mental examination revealed the patient was oriented to person, place, and time. The patient was able to demonstrate good judgement and reason, without hallucinations, abnormal affect or abnormal behaviors during the examination. Patient is not suicidal    Assessment and Plan: Symptoms consistent with asthma exacerbation due to allergies.  We will have her restart Zyrtec daily Recommended using inhalers and nebulizer as needed. Short burst of low-dose steroids sent to the pharmacy.  Instructed to monitor blood sugars   Follow Up Instructions: Follow up as needed for continued or worsening symptoms     I discussed the assessment and treatment plan with the patient. The patient was provided an opportunity to ask questions and all were answered. The patient agreed with the plan and demonstrated an understanding of the instructions.   The patient was advised to call back or seek an in-person evaluation if the symptoms worsen or if the condition fails to improve as anticipated.      Orvan July, NP  09/24/2018 10:43 AM      Orvan July, NP 09/24/18 1320

## 2018-10-08 ENCOUNTER — Other Ambulatory Visit: Payer: 59

## 2018-10-24 ENCOUNTER — Telehealth: Payer: 59 | Admitting: Nurse Practitioner

## 2018-10-24 DIAGNOSIS — R110 Nausea: Secondary | ICD-10-CM | POA: Diagnosis not present

## 2018-10-24 DIAGNOSIS — R1084 Generalized abdominal pain: Secondary | ICD-10-CM | POA: Diagnosis not present

## 2018-10-24 MED ORDER — ONDANSETRON HCL 4 MG PO TABS: 4.0000 mg | ORAL_TABLET | Freq: Three times a day (TID) | ORAL | 0 refills | Status: DC | PRN

## 2018-10-24 NOTE — Progress Notes (Signed)
We are sorry that you are not feeling well. Here is how we plan to help!  Based on what you have shared with me it looks like you have a Virus that is irritating your GI tract. Nausea is the feeling of needing to vomit. Vomiting is the forceful emptying of a portion of the stomach's content through the mouth.  Although nausea and vomiting can make you feel miserable, it's important to remember that these are not diseases, but rather symptoms of an underlying illness.  When we treat short term symptoms, we always caution that any symptoms that persist should be fully evaluated in a medical office.  I have prescribed a medication that will help alleviate your symptoms and allow you to stay hydrated:  Zofran 4 mg 1 tablet every 8 hours as needed for nausea and vomiting   * if you are no better by this evening you may need a face to face visit with your PCP or ED  HOME CARE:  Drink clear liquids.  This is very important! Dehydration (the lack of fluid) can lead to a serious complication.  Start off with 1 tablespoon every 5 minutes for 8 hours.  You may begin eating bland foods after 8 hours without vomiting.  Start with saltine crackers, white bread, rice, mashed potatoes, applesauce.  After 48 hours on a bland diet, you may resume a normal diet.  Try to go to sleep.  Sleep often empties the stomach and relieves the need to vomit.  GET HELP RIGHT AWAY IF:   Your symptoms do not improve or worsen within 2 days after treatment.  You have a fever for over 3 days.  You cannot keep down fluids after trying the medication.  MAKE SURE YOU:   Understand these instructions.  Will watch your condition.  Will get help right away if you are not doing well or get worse.   Thank you for choosing an e-visit. Your e-visit answers were reviewed by a board certified advanced clinical practitioner to complete your personal care plan. Depending upon the condition, your plan could have included both  over the counter or prescription medications. Please review your pharmacy choice. Be sure that the pharmacy you have chosen is open so that you can pick up your prescription now.  If there is a problem you may message your provider in [place] to have the prescription routed to another pharmacy. Your safety is important to [place]. If you have drug allergies check your prescription carefully.  For the next 24 hours, you can use MyChart to ask questions about today's visit, request a non-urgent call back, or ask for a work or school excuse from your e-visit provider. You will get an e-mail in the next two days asking about your experience. I hope that your e-visit has been valuable and will speed your recovery.   5-10 minutes spent reviewing and documenting in chart.

## 2018-10-30 ENCOUNTER — Other Ambulatory Visit: Payer: Self-pay

## 2018-10-30 ENCOUNTER — Encounter: Payer: Self-pay | Admitting: Family Medicine

## 2018-10-30 ENCOUNTER — Ambulatory Visit (INDEPENDENT_AMBULATORY_CARE_PROVIDER_SITE_OTHER): Payer: 59 | Admitting: Family Medicine

## 2018-10-30 VITALS — BP 120/72 | HR 100 | Temp 97.7°F | Resp 16 | Ht 63.0 in | Wt 213.4 lb

## 2018-10-30 DIAGNOSIS — R1031 Right lower quadrant pain: Secondary | ICD-10-CM

## 2018-10-30 DIAGNOSIS — Z01419 Encounter for gynecological examination (general) (routine) without abnormal findings: Secondary | ICD-10-CM | POA: Diagnosis not present

## 2018-10-30 DIAGNOSIS — I10 Essential (primary) hypertension: Secondary | ICD-10-CM | POA: Diagnosis not present

## 2018-10-30 DIAGNOSIS — N920 Excessive and frequent menstruation with regular cycle: Secondary | ICD-10-CM

## 2018-10-30 DIAGNOSIS — Z8742 Personal history of other diseases of the female genital tract: Secondary | ICD-10-CM

## 2018-10-30 MED ORDER — AMLODIPINE BESYLATE 5 MG PO TABS: 5.0000 mg | ORAL_TABLET | Freq: Every day | ORAL | 3 refills | Status: DC
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
## 2018-11-01 ENCOUNTER — Other Ambulatory Visit: Payer: Self-pay | Admitting: Hematology and Oncology

## 2018-11-01 NOTE — Progress Notes (Signed)
[facility]  [address] [place], [place] [address] Phone: [phone]  Fax: [phone]   Clinic Day:  11/06/2018  Referring physician: Hubbard Hartshorn, FNP  Chief Complaint: Donna Reyes is a 43 y.o. female with iron deficiency anemia and reactive thrombocytosis who is seen for 2 month assessment.    HPI: The patient was last seen in the hematology clinic on 08/14/2018 for new patient assessment. At that time, she felt a little tired. She denied any B symptoms. She had ice pica. Exam revealed a palpable right axillary node. She received Feraheme.   She was presented to [name], NP for LUQ soft tissue edema of the chest wall on 08/24/2018. She would be referred back to surgery if mammogram was unremarkable. Bilateral mammogram and pelvic ultrasound were ordered.   During the interim, she has felt "good". Her energy level is improving. Her menstrual cycle now last 5 days instead of 7 days; she notes x 2 days of heavy flow at the beginning of her cycle.   Her diet consist of iron rich foods. She denies any ice pica.  Her lymph node has not increased in size and she denies any pain. She is not concerned about the lymph node at this time.    Past Medical History:  Diagnosis Date  . Allergy   . Anemia   . Asthma   . Diabetes mellitus without complication ([place])   . Hypertension   . Sleep apnea     Past Surgical History:  Procedure Laterality Date  . AXILLARY LYMPH NODE BIOPSY Right 05/25/2017   LYMPHADENITIS. NEGATIVE FOR MALIGNANCY.   Marland Kitchen BREAST BIOPSY Right 05/25/2017   neg/NEGATIVE FOR CARCINOMA. INFLAMMATION AND SQUAMOUS METAPLASIA OF A FOCAL DUCT   . CESAREAN SECTION     times 3  . TUBAL LIGATION      Family History  Problem Relation Age of Onset  . Diabetes Mother   . Heart failure Mother   . Kidney disease Mother   . Diabetes Maternal Grandmother   . Breast cancer Maternal Grandmother 38  . Hypertension Father   .  Hyperlipidemia Father   . Atrial fibrillation Sister   . ADD / ADHD Son   . Heart attack Maternal Grandfather   . Diabetes Paternal Grandmother   . Hypertension Paternal Grandmother   . Prostate cancer Paternal Grandfather   . Polycystic ovary syndrome Sister   . Polycystic ovary syndrome Sister   . Ovarian cancer Neg Hx   . Colon cancer Neg Hx     Social History:  reports that she has never smoked. She has never used smokeless tobacco. She reports that she does not drink alcohol or use drugs. She previously lived in [place]. She moved to [place] in 2015 to be near her husband's family. She is now divorced. She does not smoke or drink. She denies exposure to radiation or toxins. She is an Corporate treasurer at [employer]. The patient is alone today.  Allergies:  Allergies  Allergen Reactions  . Lisinopril     Current Medications: Current Outpatient Medications  Medication Sig Dispense Refill  . albuterol (PROVENTIL) (2.5 MG/3ML) 0.083% nebulizer solution Take 2.5 mg by nebulization every 6 (six) hours as needed for wheezing or shortness of breath.    Marland Kitchen amLODipine (NORVASC) 5 MG tablet Take 1 tablet (5 mg total) by mouth daily. 90 tablet 3  . blood glucose meter kit and supplies KIT Dispense based on patient and insurance preference.  Use up to four times daily as directed. (FOR ICD-9 250.00, 250.01). 1 each 0  . Insulin Pen Needle (NOVOFINE) 32G X 6 MM MISC 1 each by Does not apply route once a week. 50 each 1  . metFORMIN (GLUCOPHAGE) 500 MG tablet Take 1 tablet (500 mg total) by mouth daily with breakfast. 90 tablet 1  . Semaglutide,0.25 or 0.5MG/DOS, (OZEMPIC, 0.25 OR 0.5 MG/DOSE,) 2 MG/1.5ML SOPN Inject 0.25 mg into the skin once a week for 28 days, THEN 0.5 mg once a week. 3 pen 1  . Ascorbic Acid (VITAMIN C) 100 MG tablet Take 100 mg by mouth daily.    . cetirizine (ZYRTEC) 10 MG tablet Take 1 tablet (10 mg total) by mouth daily. 30 tablet 0  . cholecalciferol (VITAMIN D3) 25  MCG (1000 UT) tablet Take 1,000 Units by mouth daily.    . ondansetron (ZOFRAN) 4 MG tablet Take 1 tablet (4 mg total) by mouth every 8 (eight) hours as needed for nausea or vomiting. (Patient not taking: Reported on 10/30/2018) 20 tablet 0  . zinc gluconate 50 MG tablet Take 50 mg by mouth daily.     No current facility-administered medications for this visit.     Review of Systems  Constitutional: Negative for chills, diaphoresis, fever, malaise/fatigue and weight loss (stable).       Feels "good".  HENT: Negative.  Negative for congestion, hearing loss, nosebleeds, sinus pain and sore throat.   Eyes: Negative.  Negative for blurred vision and double vision.  Respiratory: Negative.  Negative for cough, shortness of breath and wheezing.   Cardiovascular: Negative.  Negative for chest pain, palpitations, orthopnea, leg swelling and PND.  Gastrointestinal: Negative for abdominal pain, blood in stool, constipation, diarrhea, heartburn, melena, nausea and vomiting.       Ice pica resolved.  Genitourinary: Negative for dysuria, frequency, hematuria and urgency.       Heavy menses, improved.  Musculoskeletal: Negative.  Negative for back pain, joint pain and myalgias.  Skin: Negative for itching and rash.  Neurological: Negative.  Negative for dizziness, sensory change, focal weakness, weakness and headaches.  Endo/Heme/Allergies: Negative.  Does not bruise/bleed easily.  Psychiatric/Behavioral: Negative.  Negative for depression and memory loss. The patient is not nervous/anxious and does not have insomnia.   All other systems reviewed and are negative.  Performance status (ECOG):  0  Vitals Blood pressure (!) 122/91, pulse 85, temperature (!) 96.5 F (35.8 C), temperature source Tympanic, resp. rate 18, height _0  (1.6 m), weight 213 lb 8.3 oz (96.9 kg), last menstrual period 10/10/2018, SpO2 99 %.  Physical Exam  Constitutional: She is oriented to person, place, and time. She appears  well-developed and well-nourished. No distress.  HENT:  Head: Normocephalic and atraumatic.  Mouth/Throat: Oropharynx is clear and moist. No oropharyngeal exudate.  Short, dark hair. Wearing mask.   Eyes: Pupils are equal, round, and reactive to light. Conjunctivae and EOM are normal.  Contacts.  Neck: Normal range of motion. Neck supple. No JVD present.  Cardiovascular: Normal rate, regular rhythm and normal heart sounds. Exam reveals no gallop and no friction rub.  No murmur heard. Pulmonary/Chest: Effort normal and breath sounds normal. No respiratory distress. She has no wheezes. She has no rales.  Abdominal: Soft. Bowel sounds are normal. She exhibits no distension. There is no abdominal tenderness. There is no rebound and no guarding.  Musculoskeletal: Normal range of motion.        General: No edema.  Lymphadenopathy:  She has no cervical adenopathy.    She has no axillary adenopathy.       Right: No inguinal and no supraclavicular adenopathy present.       Left: No inguinal and no supraclavicular adenopathy present.  Neurological: She is alert and oriented to person, place, and time.  Skin: Skin is warm and dry. No rash noted. She is not diaphoretic. No erythema. No pallor.  Psychiatric: She has a normal mood and affect. Her behavior is normal. Judgment and thought content normal.  Nursing note and vitals reviewed.   Appointment on 11/05/2018  Component Date Value Ref Range Status  . Preg Test, Ur 11/05/2018 NEGATIVE  NEGATIVE Final   Performed at [facility], [address], [place], [place] [address]  . Ferritin 11/05/2018 9* 11 - 307 ng/mL Final   Performed at [facility], [address] [place], [place] [address]  . WBC 11/05/2018 5.6  4.0 - 10.5 K/uL Final  . RBC 11/05/2018 4.80  3.87 - 5.11 MIL/uL Final  . Hemoglobin 11/05/2018 12.4  12.0 - 15.0 g/dL Final  . HCT 11/05/2018 38.1  36.0 - 46.0 % Final  . MCV 11/05/2018 79.4* 80.0 - 100.0 fL  Final  . MCH 11/05/2018 25.8* 26.0 - 34.0 pg Final  . MCHC 11/05/2018 32.5  30.0 - 36.0 g/dL Final  . RDW 11/05/2018 16.8* 11.5 - 15.5 % Final  . Platelets 11/05/2018 424* 150 - 400 K/uL Final  . nRBC 11/05/2018 0.0  0.0 - 0.2 % Final  . Neutrophils Relative % 11/05/2018 55  % Final  . Neutro Abs 11/05/2018 3.1  1.7 - 7.7 K/uL Final  . Lymphocytes Relative 11/05/2018 32  % Final  . Lymphs Abs 11/05/2018 1.8  0.7 - 4.0 K/uL Final  . Monocytes Relative 11/05/2018 9  % Final  . Monocytes Absolute 11/05/2018 0.5  0.1 - 1.0 K/uL Final  . Eosinophils Relative 11/05/2018 3  % Final  . Eosinophils Absolute 11/05/2018 0.2  0.0 - 0.5 K/uL Final  . Basophils Relative 11/05/2018 1  % Final  . Basophils Absolute 11/05/2018 0.0  0.0 - 0.1 K/uL Final  . Immature Granulocytes 11/05/2018 0  % Final  . Abs Immature Granulocytes 11/05/2018 0.01  0.00 - 0.07 K/uL Final   Performed at [facility], [address] [place], [place] [address]    Assessment:  Donna Reyes is a 43 y.o. female with iron deficiency anemia and reactive thrombocytosis.  Etiology is felt secondary to menorrhagia.  Diet is fairly good.  She is intolerant of oral iron.   She has a history of an ulcer at a young age.  She had an EGD in 2015 in [place].  Report is unavailable. She is no longer on a PPI.  Work-up on 12/30/2015 revealed a hematocrit of 27.4, hemoglobin 8.0, MCV 60.3, platelets 391,000, and WBC 4400.  Ferritin was 4 with an iron saturation of 2% and a TIBC of 499.   Normal studies included:  B12, folate , Coom[place], ANA, and hemoglobin electrophoresis.  She previously received IV iron x 1 while living in [place].  She received Feraheme on 01/08/2016, 01/15/2016, 11/10/2017, 11/17/2017, and 01/05/2018.   Ferritin has been followed: 4 on 01/01/2016, 33 on 02/29/2016, 4 on 10/31/2017, 16 on 01/04/2018, 9 on 06/11/2018, 6 on 08/13/2018, and 9 on 11/05/2018.   She has a history of right breast lump and  adenopathy in 2019.  Biopsies on 05/25/2017 were negative for malignancy.  Pathology revealed lymphadenitis.    Symptomatically, she feels "fine".  Menses has slightly improved.  Exam is unremarkable.  Plan: 1.   Review labs from 11/05/2018. 2.   Iron deficiency anemia             Hematocrit 38.1.  Hemoglobin 12.4.  MCV 79.4.    Ferritin 9.  Ferritin goal 100.             Etiology secondary to heavy menses, slightly improved.             Discuss plan for reinitiation of IV iron.  Feraheme switched to Venofer secondary to insurance coverage.  Venofer today and weekly x 2 (total 3). 3.   Bleeding diathesis             Patient with heavy menses.             Follow-up with gynecology.             Review plan to r/o von Willebrand's disease with next heavy menses (patient to call for testing).  4.  Right axillary adenopathy             No palpable axillary adenopathy on today's exam. 5.   RTC in 3 months for labs (CBC, ferritin). 6.   RTC in 6 months for MD assessment, labs (CBC with diff, ferritin-day before), and +/- Venofer.  I discussed the assessment and treatment plan with the patient.  The patient was provided an opportunity to ask questions and all were answered.  The patient agreed with the plan and demonstrated an understanding of the instructions.  The patient was advised to call back if the symptoms worsen or if the condition fails to improve as anticipated.   Lequita Asal, MD, PhD    11/06/2018, 9:17 AM  I, Selena Batten, am acting as scribe for [employer]. Mike Gip, MD, PhD.  I, Melissa C. Mike Gip, MD, have reviewed the above documentation for accuracy and completeness, and I agree with the above.

## 2018-11-05 ENCOUNTER — Inpatient Hospital Stay: Payer: 59 | Attending: Hematology and Oncology

## 2018-11-05 ENCOUNTER — Other Ambulatory Visit: Payer: Self-pay

## 2018-11-05 ENCOUNTER — Other Ambulatory Visit: Payer: Self-pay | Admitting: *Deleted

## 2018-11-05 ENCOUNTER — Encounter: Payer: Self-pay | Admitting: Family Medicine

## 2018-11-05 DIAGNOSIS — J45909 Unspecified asthma, uncomplicated: Secondary | ICD-10-CM | POA: Diagnosis not present

## 2018-11-05 DIAGNOSIS — E119 Type 2 diabetes mellitus without complications: Secondary | ICD-10-CM | POA: Insufficient documentation

## 2018-11-05 DIAGNOSIS — I10 Essential (primary) hypertension: Secondary | ICD-10-CM | POA: Diagnosis not present

## 2018-11-05 DIAGNOSIS — D500 Iron deficiency anemia secondary to blood loss (chronic): Secondary | ICD-10-CM

## 2018-11-05 DIAGNOSIS — Z794 Long term (current) use of insulin: Secondary | ICD-10-CM | POA: Insufficient documentation

## 2018-11-05 DIAGNOSIS — R7989 Other specified abnormal findings of blood chemistry: Secondary | ICD-10-CM | POA: Diagnosis not present

## 2018-11-05 DIAGNOSIS — N920 Excessive and frequent menstruation with regular cycle: Secondary | ICD-10-CM | POA: Diagnosis not present

## 2018-11-05 DIAGNOSIS — D508 Other iron deficiency anemias: Secondary | ICD-10-CM | POA: Insufficient documentation

## 2018-11-05 DIAGNOSIS — Z79899 Other long term (current) drug therapy: Secondary | ICD-10-CM | POA: Diagnosis not present

## 2018-11-05 DIAGNOSIS — G4730 Sleep apnea, unspecified: Secondary | ICD-10-CM | POA: Insufficient documentation

## 2018-11-05 LAB — CBC WITH DIFFERENTIAL/PLATELET
Abs Immature Granulocytes: 0.01 10*3/uL (ref 0.00–0.07)
Basophils Absolute: 0 10*3/uL (ref 0.0–0.1)
Basophils Relative: 1 %
Eosinophils Absolute: 0.2 10*3/uL (ref 0.0–0.5)
Eosinophils Relative: 3 %
HCT: 38.1 % (ref 36.0–46.0)
Hemoglobin: 12.4 g/dL (ref 12.0–15.0)
Immature Granulocytes: 0 %
Lymphocytes Relative: 32 %
Lymphs Abs: 1.8 10*3/uL (ref 0.7–4.0)
MCH: 25.8 pg — ABNORMAL LOW (ref 26.0–34.0)
MCHC: 32.5 g/dL (ref 30.0–36.0)
MCV: 79.4 fL — ABNORMAL LOW (ref 80.0–100.0)
Monocytes Absolute: 0.5 10*3/uL (ref 0.1–1.0)
Monocytes Relative: 9 %
Neutro Abs: 3.1 10*3/uL (ref 1.7–7.7)
Neutrophils Relative %: 55 %
Platelets: 424 10*3/uL — ABNORMAL HIGH (ref 150–400)
RBC: 4.8 MIL/uL (ref 3.87–5.11)
RDW: 16.8 % — ABNORMAL HIGH (ref 11.5–15.5)
WBC: 5.6 10*3/uL (ref 4.0–10.5)
nRBC: 0 % (ref 0.0–0.2)

## 2018-11-05 LAB — PREGNANCY, URINE: Preg Test, Ur: NEGATIVE

## 2018-11-05 LAB — FERRITIN: Ferritin: 9 ng/mL — ABNORMAL LOW (ref 11–307)

## 2018-11-06 ENCOUNTER — Encounter: Payer: Self-pay | Admitting: Hematology and Oncology

## 2018-11-06 ENCOUNTER — Encounter: Payer: Self-pay | Admitting: *Deleted

## 2018-11-06 ENCOUNTER — Inpatient Hospital Stay: Payer: 59

## 2018-11-06 ENCOUNTER — Inpatient Hospital Stay: Payer: 59 | Admitting: Hematology and Oncology

## 2018-11-06 VITALS — BP 122/91 | HR 85 | Temp 96.5°F | Resp 18 | Ht 63.0 in | Wt 213.5 lb

## 2018-11-06 DIAGNOSIS — I10 Essential (primary) hypertension: Secondary | ICD-10-CM | POA: Diagnosis not present

## 2018-11-06 DIAGNOSIS — R7989 Other specified abnormal findings of blood chemistry: Secondary | ICD-10-CM | POA: Diagnosis not present

## 2018-11-06 DIAGNOSIS — D500 Iron deficiency anemia secondary to blood loss (chronic): Secondary | ICD-10-CM

## 2018-11-06 DIAGNOSIS — N920 Excessive and frequent menstruation with regular cycle: Secondary | ICD-10-CM

## 2018-11-06 DIAGNOSIS — Z794 Long term (current) use of insulin: Secondary | ICD-10-CM | POA: Diagnosis not present

## 2018-11-06 DIAGNOSIS — R590 Localized enlarged lymph nodes: Secondary | ICD-10-CM

## 2018-11-06 DIAGNOSIS — D508 Other iron deficiency anemias: Secondary | ICD-10-CM | POA: Diagnosis not present

## 2018-11-06 DIAGNOSIS — Z79899 Other long term (current) drug therapy: Secondary | ICD-10-CM | POA: Diagnosis not present

## 2018-11-06 DIAGNOSIS — G4730 Sleep apnea, unspecified: Secondary | ICD-10-CM | POA: Diagnosis not present

## 2018-11-06 DIAGNOSIS — E119 Type 2 diabetes mellitus without complications: Secondary | ICD-10-CM | POA: Diagnosis not present

## 2018-11-06 DIAGNOSIS — J45909 Unspecified asthma, uncomplicated: Secondary | ICD-10-CM | POA: Diagnosis not present

## 2018-11-06 LAB — VON WILLEBRAND PANEL
Coagulation Factor VIII: 183 % — ABNORMAL HIGH (ref 56–140)
Ristocetin Co-factor, Plasma: 77 % (ref 50–200)
Von Willebrand Antigen, Plasma: 128 % (ref 50–200)

## 2018-11-06 LAB — COAG STUDIES INTERP REPORT

## 2018-11-06 MED ORDER — SODIUM CHLORIDE 0.9 % IV SOLN: 200.0000 mg | Freq: Once | INTRAVENOUS | Status: DC

## 2018-11-06 MED ORDER — IRON SUCROSE 20 MG/ML IV SOLN
200.0000 mg | Freq: Once | INTRAVENOUS | Status: AC
  Administered 2018-11-06: 200 mg via INTRAVENOUS

## 2018-11-06 MED ORDER — SODIUM CHLORIDE 0.9 % IV SOLN
Freq: Once | INTRAVENOUS | Status: AC
  Administered 2018-11-06: 10:00:00 via INTRAVENOUS
  Filled 2018-11-06: qty 250

## 2018-11-06 NOTE — Progress Notes (Signed)
No new changes noted today 

## 2018-11-06 NOTE — Patient Instructions (Signed)

## 2018-11-06 NOTE — Progress Notes (Signed)
Patient is receiving Venofer today as her insurance no longer pays for [facility] as 1 st line treatment. Verified with Aleen Sells Chrismon. Informed patient as well.

## 2018-11-07 ENCOUNTER — Telehealth: Payer: 59 | Admitting: Family

## 2018-11-07 DIAGNOSIS — B3731 Acute candidiasis of vulva and vagina: Secondary | ICD-10-CM

## 2018-11-07 DIAGNOSIS — B373 Candidiasis of vulva and vagina: Secondary | ICD-10-CM

## 2018-11-07 MED ORDER — FLUCONAZOLE 150 MG PO TABS: 150.0000 mg | ORAL_TABLET | ORAL | 0 refills | Status: DC | PRN

## 2018-11-07 NOTE — Progress Notes (Signed)
We are sorry that you are not feeling well. Here is how we plan to help! Based on what you shared with me it looks like you: May have a yeast vaginosis  Vaginosis is an inflammation of the vagina that can result in discharge, itching and pain. The cause is usually a change in the normal balance of vaginal bacteria or an infection. Vaginosis can also result from reduced estrogen levels after menopause.  Approximately 5 minutes was spent documenting and reviewing patient's chart.    The most common causes of vaginosis are:   Bacterial vaginosis which results from an overgrowth of one on several organisms that are normally present in your vagina.   Yeast infections which are caused by a naturally occurring fungus called candida.   Vaginal atrophy (atrophic vaginosis) which results from the thinning of the vagina from reduced estrogen levels after menopause.   Trichomoniasis which is caused by a parasite and is commonly transmitted by sexual intercourse.  Factors that increase your risk of developing vaginosis include: Marland Kitchen Medications, such as antibiotics and steroids . Uncontrolled diabetes . Use of hygiene products such as bubble bath, vaginal spray or vaginal deodorant . Douching . Wearing damp or tight-fitting clothing . Using an intrauterine device (IUD) for birth control . Hormonal changes, such as those associated with pregnancy, birth control pills or menopause . Sexual activity . Having a sexually transmitted infection  Your treatment plan is A single Diflucan (fluconazole) 150mg  tablet once.  I have electronically sent this prescription into the pharmacy that you have chosen.  Be sure to take all of the medication as directed. Stop taking any medication if you develop a rash, tongue swelling or shortness of breath. Mothers who are breast feeding should consider pumping and discarding their breast milk while on these antibiotics. However, there is no consensus that infant exposure  at these doses would be harmful.  Remember that medication creams can weaken latex condoms. Marland Kitchen   HOME CARE:  Good hygiene may prevent some types of vaginosis from recurring and may relieve some symptoms:  . Avoid baths, hot tubs and whirlpool spas. Rinse soap from your outer genital area after a shower, and dry the area well to prevent irritation. Don't use scented or harsh soaps, such as those with deodorant or antibacterial action. Marland Kitchen Avoid irritants. These include scented tampons and pads. . Wipe from front to back after using the toilet. Doing so avoids spreading fecal bacteria to your vagina.  Other things that may help prevent vaginosis include:  Marland Kitchen Don't douche. Your vagina doesn't require cleansing other than normal bathing. Repetitive douching disrupts the normal organisms that reside in the vagina and can actually increase your risk of vaginal infection. Douching won't clear up a vaginal infection. . Use a latex condom. Both female and female latex condoms may help you avoid infections spread by sexual contact. . Wear cotton underwear. Also wear pantyhose with a cotton crotch. If you feel comfortable without it, skip wearing underwear to bed. Yeast thrives in [employer] Your symptoms should improve in the next day or two.  GET HELP RIGHT AWAY IF:  . You have pain in your lower abdomen ( pelvic area or over your ovaries) . You develop nausea or vomiting . You develop a fever . Your discharge changes or worsens . You have persistent pain with intercourse . You develop shortness of breath, a rapid pulse, or you faint.  These symptoms could be signs of problems or infections that need to  be evaluated by a medical provider now.  MAKE SURE YOU    Understand these instructions.  Will watch your condition.  Will get help right away if you are not doing well or get worse.  Your e-visit answers were reviewed by a board certified advanced clinical practitioner to complete  your personal care plan. Depending upon the condition, your plan could have included both over the counter or prescription medications. Please review your pharmacy choice to make sure that you have choses a pharmacy that is open for you to pick up any needed prescription, Your safety is important to us. If you have drug allergies check your prescription carefully.   You can use MyChart to ask questions about today's visit, request a non-urgent call back, or ask for a work or school excuse for 24 hours related to this e-Visit. If it has been greater than 24 hours you will need to follow up with your provider, or enter a new e-Visit to address those concerns. You will get a MyChart message within the next two days asking about your experience. I hope that your e-visit has been valuable and will speed your recovery.  

## 2018-11-16 ENCOUNTER — Inpatient Hospital Stay: Payer: 59

## 2018-11-16 ENCOUNTER — Other Ambulatory Visit: Payer: Self-pay

## 2018-11-16 VITALS — BP 124/82 | HR 79 | Temp 96.6°F | Resp 18

## 2018-11-16 DIAGNOSIS — I10 Essential (primary) hypertension: Secondary | ICD-10-CM | POA: Diagnosis not present

## 2018-11-16 DIAGNOSIS — J45909 Unspecified asthma, uncomplicated: Secondary | ICD-10-CM | POA: Diagnosis not present

## 2018-11-16 DIAGNOSIS — E119 Type 2 diabetes mellitus without complications: Secondary | ICD-10-CM | POA: Diagnosis not present

## 2018-11-16 DIAGNOSIS — G4730 Sleep apnea, unspecified: Secondary | ICD-10-CM | POA: Diagnosis not present

## 2018-11-16 DIAGNOSIS — D508 Other iron deficiency anemias: Secondary | ICD-10-CM

## 2018-11-16 DIAGNOSIS — N920 Excessive and frequent menstruation with regular cycle: Secondary | ICD-10-CM | POA: Diagnosis not present

## 2018-11-16 DIAGNOSIS — Z794 Long term (current) use of insulin: Secondary | ICD-10-CM | POA: Diagnosis not present

## 2018-11-16 DIAGNOSIS — R7989 Other specified abnormal findings of blood chemistry: Secondary | ICD-10-CM | POA: Diagnosis not present

## 2018-11-16 DIAGNOSIS — Z79899 Other long term (current) drug therapy: Secondary | ICD-10-CM | POA: Diagnosis not present

## 2018-11-16 MED ORDER — SODIUM CHLORIDE 0.9 % IV SOLN: 200.0000 mg | Freq: Once | INTRAVENOUS | Status: DC

## 2018-11-16 MED ORDER — SODIUM CHLORIDE 0.9 % IV SOLN
Freq: Once | INTRAVENOUS | Status: AC
  Administered 2018-11-16: 15:00:00 via INTRAVENOUS
  Filled 2018-11-16: qty 250

## 2018-11-16 MED ORDER — IRON SUCROSE 20 MG/ML IV SOLN
200.0000 mg | Freq: Once | INTRAVENOUS | Status: AC
  Administered 2018-11-16: 200 mg via INTRAVENOUS

## 2018-11-16 NOTE — Patient Instructions (Signed)

## 2018-11-23 ENCOUNTER — Inpatient Hospital Stay: Payer: 59

## 2018-11-23 ENCOUNTER — Other Ambulatory Visit: Payer: Self-pay

## 2018-11-23 VITALS — BP 140/86 | HR 80 | Temp 97.8°F | Resp 18

## 2018-11-23 DIAGNOSIS — D508 Other iron deficiency anemias: Secondary | ICD-10-CM | POA: Diagnosis not present

## 2018-11-23 DIAGNOSIS — J45909 Unspecified asthma, uncomplicated: Secondary | ICD-10-CM | POA: Diagnosis not present

## 2018-11-23 DIAGNOSIS — G4730 Sleep apnea, unspecified: Secondary | ICD-10-CM | POA: Diagnosis not present

## 2018-11-23 DIAGNOSIS — I10 Essential (primary) hypertension: Secondary | ICD-10-CM | POA: Diagnosis not present

## 2018-11-23 DIAGNOSIS — Z794 Long term (current) use of insulin: Secondary | ICD-10-CM | POA: Diagnosis not present

## 2018-11-23 DIAGNOSIS — E119 Type 2 diabetes mellitus without complications: Secondary | ICD-10-CM | POA: Diagnosis not present

## 2018-11-23 DIAGNOSIS — N920 Excessive and frequent menstruation with regular cycle: Secondary | ICD-10-CM | POA: Diagnosis not present

## 2018-11-23 DIAGNOSIS — R7989 Other specified abnormal findings of blood chemistry: Secondary | ICD-10-CM | POA: Diagnosis not present

## 2018-11-23 DIAGNOSIS — Z79899 Other long term (current) drug therapy: Secondary | ICD-10-CM | POA: Diagnosis not present

## 2018-11-23 MED ORDER — SODIUM CHLORIDE 0.9 % IV SOLN: 200.0000 mg | Freq: Once | INTRAVENOUS | Status: DC

## 2018-11-23 MED ORDER — IRON SUCROSE 20 MG/ML IV SOLN
200.0000 mg | Freq: Once | INTRAVENOUS | Status: AC
  Administered 2018-11-23: 200 mg via INTRAVENOUS

## 2018-11-23 MED ORDER — SODIUM CHLORIDE 0.9 % IV SOLN
Freq: Once | INTRAVENOUS | Status: AC
  Administered 2018-11-23: 15:00:00 via INTRAVENOUS
  Filled 2018-11-23: qty 250

## 2018-11-23 NOTE — Patient Instructions (Signed)

## 2018-11-30 ENCOUNTER — Ambulatory Visit (INDEPENDENT_AMBULATORY_CARE_PROVIDER_SITE_OTHER): Payer: 59 | Admitting: Family Medicine

## 2018-11-30 ENCOUNTER — Other Ambulatory Visit: Payer: Self-pay

## 2018-11-30 ENCOUNTER — Encounter: Payer: Self-pay | Admitting: Family Medicine

## 2018-11-30 VITALS — BP 124/82 | HR 81 | Temp 97.9°F | Resp 16 | Ht 63.0 in | Wt 207.1 lb

## 2018-11-30 DIAGNOSIS — D500 Iron deficiency anemia secondary to blood loss (chronic): Secondary | ICD-10-CM

## 2018-11-30 DIAGNOSIS — J4521 Mild intermittent asthma with (acute) exacerbation: Secondary | ICD-10-CM

## 2018-11-30 DIAGNOSIS — N920 Excessive and frequent menstruation with regular cycle: Secondary | ICD-10-CM

## 2018-11-30 DIAGNOSIS — I10 Essential (primary) hypertension: Secondary | ICD-10-CM | POA: Diagnosis not present

## 2018-11-30 DIAGNOSIS — R590 Localized enlarged lymph nodes: Secondary | ICD-10-CM | POA: Diagnosis not present

## 2018-11-30 DIAGNOSIS — E1169 Type 2 diabetes mellitus with other specified complication: Secondary | ICD-10-CM | POA: Diagnosis not present

## 2018-11-30 DIAGNOSIS — D649 Anemia, unspecified: Secondary | ICD-10-CM

## 2018-11-30 DIAGNOSIS — E11628 Type 2 diabetes mellitus with other skin complications: Secondary | ICD-10-CM | POA: Diagnosis not present

## 2018-11-30 DIAGNOSIS — E785 Hyperlipidemia, unspecified: Secondary | ICD-10-CM

## 2018-11-30 DIAGNOSIS — Z23 Encounter for immunization: Secondary | ICD-10-CM | POA: Diagnosis not present

## 2018-11-30 DIAGNOSIS — F419 Anxiety disorder, unspecified: Secondary | ICD-10-CM

## 2018-11-30 LAB — POCT GLYCOSYLATED HEMOGLOBIN (HGB A1C): HbA1c, POC (prediabetic range): 6.2 % (ref 5.7–6.4)

## 2018-11-30 MED ORDER — ESCITALOPRAM OXALATE 5 MG PO TABS: 5.0000 mg | ORAL_TABLET | Freq: Every day | ORAL | 1 refills | Status: DC

## 2018-11-30 MED ORDER — PITAVASTATIN CALCIUM 2 MG PO TABS: 2.0000 mg | ORAL_TABLET | Freq: Every day | ORAL | 3 refills | Status: DC
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
## 2018-12-04 ENCOUNTER — Telehealth: Payer: Self-pay | Admitting: Obstetrics & Gynecology

## 2018-12-04 NOTE — Telephone Encounter (Signed)
[facility] referring for Iron deficiency anemia due to chronic blood loss , Menorrhagia with regular cycle. Called and left voicemail for patient to call back to be schedule

## 2018-12-06 ENCOUNTER — Encounter: Payer: Self-pay | Admitting: Family Medicine

## 2018-12-06 DIAGNOSIS — G4733 Obstructive sleep apnea (adult) (pediatric): Secondary | ICD-10-CM

## 2018-12-07 ENCOUNTER — Other Ambulatory Visit: Payer: Self-pay

## 2018-12-07 DIAGNOSIS — G4733 Obstructive sleep apnea (adult) (pediatric): Secondary | ICD-10-CM

## 2018-12-07 NOTE — Progress Notes (Signed)
Order signed and printed. Please fax. Thanks!

## 2018-12-11 ENCOUNTER — Encounter: Payer: Self-pay | Admitting: Family Medicine

## 2018-12-14 ENCOUNTER — Telehealth: Payer: 59 | Admitting: Physician Assistant

## 2018-12-14 ENCOUNTER — Encounter: Payer: Self-pay | Admitting: Physician Assistant

## 2018-12-14 DIAGNOSIS — H00012 Hordeolum externum right lower eyelid: Secondary | ICD-10-CM

## 2018-12-14 MED ORDER — NEOMYCIN-POLYMYXIN-HC 3.5-10000-1 OP SUSP: OPHTHALMIC | 0 refills | Status: DC

## 2018-12-14 MED ORDER — ERYTHROMYCIN 5 MG/GM OP OINT: 1.0000 "application " | TOPICAL_OINTMENT | Freq: Every day | OPHTHALMIC | 0 refills | Status: DC

## 2018-12-14 NOTE — Progress Notes (Signed)
I have spent 5 minutes in review of e-visit questionnaire, review and updating patient chart, medical decision making and response to patient.   Akshith Moncus Cody Amaya Blakeman, PA-C    

## 2018-12-14 NOTE — Progress Notes (Signed)
We are sorry that you are not feeling well. Here is how we plan to help!  Based on what you have shared with me it looks like you have a stye.  A stye is an inflammation of the eyelid.  It is often a red, painful lump near the edge of the eyelid that may look like a boil or a pimple.  A stye develops when an infection occurs at the base of an eyelash.   We have made appropriate suggestions for you based upon your presentation: Your symptoms may indicate a mild infection of the sclera. Apply warm com  The use of anti-inflammatory and antibiotic eye drops for a week will help resolve this condition.  I have sent in neomycin-polymyxin HC opthalmic suspension, two to three drops in the affected eye every 4 hours.  If your symptoms do not improve over the next two to three days you should be seen in your doctor's office.  HOME CARE:   Wash your hands often!  Let the stye open on its own. Don't squeeze or open it.  Don't rub your eyes. This can irritate your eyes and let in bacteria.  If you need to touch your eyes, wash your hands first.  Don't wear eye makeup or contact lenses until the area has healed.  GET HELP RIGHT AWAY IF:   Your symptoms do not improve.  You develop blurred or loss of vision.  Your symptoms worsen (increased discharge, pain or redness).  Thank you for choosing an e-visit.  Your e-visit answers were reviewed by a board certified advanced clinical practitioner to complete your personal care plan.  Depending upon the condition, your plan could have included both over the counter or prescription medications.  Please review your pharmacy choice.  Make sure the pharmacy is open so you can pick up prescription now.  If there is a problem, you may contact your provider through [employer] and have the prescription routed to another pharmacy.    Your safety is important to [place].  If you have drug allergies check your prescription carefully.  For the next 24 hours you  can use MyChart to ask questions about today's visit, request a non-urgent call back, or ask for a work or school excuse.  You will get an email in the next two days asking about your experience.  I hope you that your e-visit has been valuable and will speed your recovery.

## 2018-12-14 NOTE — Addendum Note (Signed)
Addended by: Cari Caraway on: 12/14/2018 12:56 PM   Modules accepted: Orders

## 2018-12-18 ENCOUNTER — Encounter: Payer: Self-pay | Admitting: *Deleted

## 2018-12-20 ENCOUNTER — Ambulatory Visit: Payer: 59 | Admitting: Obstetrics and Gynecology

## 2018-12-20 ENCOUNTER — Other Ambulatory Visit: Payer: Self-pay

## 2018-12-20 ENCOUNTER — Other Ambulatory Visit (HOSPITAL_COMMUNITY)
Admission: RE | Admit: 2018-12-20 | Discharge: 2018-12-20 | Disposition: A | Discharge status: Home / Self Care | Payer: 59 | Source: Ambulatory Visit | Attending: Obstetrics and Gynecology | Admitting: Obstetrics and Gynecology

## 2018-12-20 ENCOUNTER — Encounter: Payer: Self-pay | Admitting: Obstetrics and Gynecology

## 2018-12-20 VITALS — BP 122/81 | HR 81 | Ht 63.0 in | Wt 207.1 lb

## 2018-12-20 DIAGNOSIS — N898 Other specified noninflammatory disorders of vagina: Secondary | ICD-10-CM | POA: Insufficient documentation

## 2018-12-20 NOTE — Progress Notes (Signed)
Patient comes in today for STD check. She is having a yellowish discharge with odor.
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
## 2018-12-22 LAB — CERVICOVAGINAL ANCILLARY ONLY
Chlamydia: NEGATIVE
Neisseria Gonorrhea: NEGATIVE

## 2019-01-01 ENCOUNTER — Other Ambulatory Visit: Payer: Self-pay

## 2019-01-01 ENCOUNTER — Telehealth: Payer: 59

## 2019-01-01 ENCOUNTER — Emergency Department
Admission: EM | Admit: 2019-01-01 | Discharge: 2019-01-01 | Disposition: A | Discharge status: Home / Self Care | Payer: 59 | Attending: Student in an Organized Health Care Education/Training Program | Admitting: Student in an Organized Health Care Education/Training Program

## 2019-01-01 ENCOUNTER — Encounter: Payer: Self-pay | Admitting: Emergency Medicine

## 2019-01-01 DIAGNOSIS — R55 Syncope and collapse: Secondary | ICD-10-CM | POA: Diagnosis not present

## 2019-01-01 DIAGNOSIS — Z79899 Other long term (current) drug therapy: Secondary | ICD-10-CM | POA: Insufficient documentation

## 2019-01-01 DIAGNOSIS — R42 Dizziness and giddiness: Secondary | ICD-10-CM | POA: Insufficient documentation

## 2019-01-01 DIAGNOSIS — R5383 Other fatigue: Secondary | ICD-10-CM | POA: Diagnosis not present

## 2019-01-01 DIAGNOSIS — J45909 Unspecified asthma, uncomplicated: Secondary | ICD-10-CM | POA: Insufficient documentation

## 2019-01-01 DIAGNOSIS — E119 Type 2 diabetes mellitus without complications: Secondary | ICD-10-CM | POA: Diagnosis not present

## 2019-01-01 DIAGNOSIS — I10 Essential (primary) hypertension: Secondary | ICD-10-CM | POA: Diagnosis not present

## 2019-01-01 DIAGNOSIS — Z794 Long term (current) use of insulin: Secondary | ICD-10-CM | POA: Insufficient documentation

## 2019-01-01 DIAGNOSIS — R531 Weakness: Secondary | ICD-10-CM | POA: Diagnosis not present

## 2019-01-01 LAB — BASIC METABOLIC PANEL
Anion gap: 7 (ref 5–15)
BUN: 15 mg/dL (ref 6–20)
CO2: 26 mmol/L (ref 22–32)
Calcium: 9.5 mg/dL (ref 8.9–10.3)
Chloride: 104 mmol/L (ref 98–111)
Creatinine, Ser: 0.88 mg/dL (ref 0.44–1.00)
GFR calc Af Amer: >60 mL/min (ref >60)
GFR calc non Af Amer: >60 mL/min (ref >60)
Glucose, Bld: 119 mg/dL — ABNORMAL HIGH (ref 70–99)
Potassium: 4.2 mmol/L (ref 3.5–5.1)
Sodium: 137 mmol/L (ref 135–145)

## 2019-01-01 LAB — CBC
HCT: 40.3 % (ref 36.0–46.0)
Hemoglobin: 12.9 g/dL (ref 12.0–15.0)
MCH: 26.7 pg (ref 26.0–34.0)
MCHC: 32 g/dL (ref 30.0–36.0)
MCV: 83.4 fL (ref 80.0–100.0)
Platelets: 359 10*3/uL (ref 150–400)
RBC: 4.83 MIL/uL (ref 3.87–5.11)
RDW: 14.8 % (ref 11.5–15.5)
WBC: 5.4 10*3/uL (ref 4.0–10.5)
nRBC: 0 % (ref 0.0–0.2)

## 2019-01-01 LAB — TROPONIN I (HIGH SENSITIVITY): Troponin I (High Sensitivity): <2 ng/L (ref <18)

## 2019-01-01 MED ORDER — SODIUM CHLORIDE 0.9% FLUSH: 3.0000 mL | Freq: Once | INTRAVENOUS | Status: DC

## 2019-01-01 MED ORDER — PREDNISONE 20 MG PO TABS: 40.0000 mg | ORAL_TABLET | Freq: Every day | ORAL | 0 refills | Status: AC

## 2019-01-01 MED ORDER — ALBUTEROL SULFATE HFA 108 (90 BASE) MCG/ACT IN AERS: 2.0000 | INHALATION_SPRAY | Freq: Four times a day (QID) | RESPIRATORY_TRACT | 0 refills | Status: DC | PRN

## 2019-01-01 NOTE — ED Provider Notes (Signed)
[facility] Emergency Department Provider Note    First MD Initiated Contact with Patient 01/01/19 1836     (approximate)  I have reviewed the triage vital signs and the nursing notes.   HISTORY  Chief Complaint Dizziness    HPI Donna Reyes is a 43 y.o. female presents the ER for near syncopal event lightheadedness.  States that she is been feeling rundown and fatigued since she had a tattoo placed this past week.  Not having nausea or vomiting.  Has had normal appetite.  Denied any chest pain.  Says that she has been having to use her inhaler more frequently.  Does have a history of asthma.  Not having a cough or congestion.  States that when she was working a day felt like she was going "nonstop "started feeling fatigued and weak and had to sit down.  Denied any palpitations or chest pain at the time.  Denies any history of cardiac illness.    Past Medical History:  Diagnosis Date  . Allergy   . Anemia   . Asthma   . Diabetes mellitus without complication ([place])   . Hypertension   . Sleep apnea    Family History  Problem Relation Age of Onset  . Diabetes Mother   . Heart failure Mother   . Kidney disease Mother   . Diabetes Maternal Grandmother   . Breast cancer Maternal Grandmother 29  . Hypertension Father   . Hyperlipidemia Father   . Atrial fibrillation Sister   . ADD / ADHD Son   . Heart attack Maternal Grandfather   . Diabetes Paternal Grandmother   . Hypertension Paternal Grandmother   . Prostate cancer Paternal Grandfather   . Polycystic ovary syndrome Sister   . Polycystic ovary syndrome Sister   . Ovarian cancer Neg Hx   . Colon cancer Neg Hx    Past Surgical History:  Procedure Laterality Date  . AXILLARY LYMPH NODE BIOPSY Right 05/25/2017   LYMPHADENITIS. NEGATIVE FOR MALIGNANCY.   Marland Kitchen BREAST BIOPSY Right 05/25/2017   neg/NEGATIVE FOR CARCINOMA. INFLAMMATION AND SQUAMOUS METAPLASIA OF A FOCAL DUCT   . CESAREAN  SECTION     times 3  . TUBAL LIGATION     Patient Active Problem List   Diagnosis Date Noted  . Controlled type 2 diabetes mellitus with skin complication ([place]) 46/27/0350  . Palpable lymph node 08/17/2018  . Menorrhagia with regular cycle 08/14/2018  . Lymphadenopathy, axillary 10/27/2017  . Anemia 10/27/2017  . Chest pain 01/06/2016  . Essential hypertension 01/06/2016  . Mild intermittent asthma with acute exacerbation 01/06/2016  . Iron deficiency anemia 12/30/2015      Prior to Admission medications   Medication Sig Start Date End Date Taking? Authorizing Provider  albuterol (PROVENTIL) (2.5 MG/3ML) 0.083% nebulizer solution Take 2.5 mg by nebulization every 6 (six) hours as needed for wheezing or shortness of breath.    [provider]  albuterol (VENTOLIN HFA) 108 (90 Base) MCG/ACT inhaler Inhale 2 puffs into the lungs every 6 (six) hours as needed for wheezing or shortness of breath. 01/01/19   Merlyn Lot, MD  amLODipine (NORVASC) 5 MG tablet Take 1 tablet (5 mg total) by mouth daily. 10/30/18   Hubbard Hartshorn, FNP  Ascorbic Acid (VITAMIN C) 100 MG tablet Take 100 mg by mouth daily.    [provider]  blood glucose meter kit and supplies KIT Dispense based on patient and insurance preference. Use up to four times  daily as directed. (FOR ICD-9 250.00, 250.01). 09/18/18   Hubbard Hartshorn, FNP  cetirizine (ZYRTEC) 10 MG tablet Take 1 tablet (10 mg total) by mouth daily. 09/24/18   Loura Halt A, NP  cholecalciferol (VITAMIN D3) 25 MCG (1000 UT) tablet Take 1,000 Units by mouth daily.    [provider]  escitalopram (LEXAPRO) 5 MG tablet Take 1 tablet (5 mg total) by mouth daily. 11/30/18   Hubbard Hartshorn, FNP  Insulin Pen Needle (NOVOFINE) 32G X 6 MM MISC 1 each by Does not apply route once a week. 08/28/18   Hubbard Hartshorn, FNP  ondansetron (ZOFRAN) 4 MG tablet Take 1 tablet (4 mg total) by mouth every 8 (eight) hours as needed for nausea or vomiting.  10/24/18   Hassell Done, Mary-Margaret, FNP  Pitavastatin Calcium 2 MG TABS Take 1 tablet (2 mg total) by mouth daily. 11/30/18   Hubbard Hartshorn, FNP  predniSONE (DELTASONE) 20 MG tablet Take 2 tablets (40 mg total) by mouth daily for 5 days. 01/01/19 01/06/19  Merlyn Lot, MD  zinc gluconate 50 MG tablet Take 50 mg by mouth daily.    [provider]    Allergies Lisinopril    Social History Social History   Tobacco Use  . Smoking status: Never Smoker  . Smokeless tobacco: Never Used  Substance Use Topics  . Alcohol use: No  . Drug use: No    Review of Systems Patient denies headaches, rhinorrhea, blurry vision, numbness, shortness of breath, chest pain, edema, cough, abdominal pain, nausea, vomiting, diarrhea, dysuria, fevers, rashes or hallucinations unless otherwise stated above in HPI. ____________________________________________   PHYSICAL EXAM:  VITAL SIGNS: Vitals:   01/01/19 1712  BP: (!) 140/94  Pulse: 76  Resp: 20  Temp: 98.9 F (37.2 C)  SpO2: 100%    Constitutional: Alert and oriented.  Eyes: Conjunctivae are normal.  Head: Atraumatic. Nose: No congestion/rhinnorhea. Mouth/Throat: Mucous membranes are moist.   Neck: No stridor. Painless ROM.  Cardiovascular: Normal rate, regular rhythm. Grossly normal heart sounds.  Good peripheral circulation. Respiratory: Normal respiratory effort.  No retractions. Lungs with scattered occasional wheeze. Gastrointestinal: Soft and nontender. No distention. No abdominal bruits. No CVA tenderness. Genitourinary:  Musculoskeletal: No lower extremity tenderness nor edema.  No joint effusions. Neurologic:  Normal speech and language. No gross focal neurologic deficits are appreciated. No facial droop Skin:  Skin is warm, dry and intact. No rash noted. Psychiatric: Mood and affect are normal. Speech and behavior are normal.  ____________________________________________   LABS (all labs ordered are listed, but  only abnormal results are displayed)  Results for orders placed or performed during the hospital encounter of 01/01/19 (from the past 24 hour(s))  Basic metabolic panel     Status: Abnormal   Collection Time: 01/01/19  5:17 PM  Result Value Ref Range   Sodium 137 135 - 145 mmol/L   Potassium 4.2 3.5 - 5.1 mmol/L   Chloride 104 98 - 111 mmol/L   CO2 26 22 - 32 mmol/L   Glucose, Bld 119 (H) 70 - 99 mg/dL   BUN 15 6 - 20 mg/dL   Creatinine, Ser 0.88 0.44 - 1.00 mg/dL   Calcium 9.5 8.9 - 10.3 mg/dL   GFR calc non Af Amer >60 >60 mL/min   GFR calc Af Amer >60 >60 mL/min   Anion gap 7 5 - 15  CBC     Status: None   Collection Time: 01/01/19  5:17 PM  Result  Value Ref Range   WBC 5.4 4.0 - 10.5 K/uL   RBC 4.83 3.87 - 5.11 MIL/uL   Hemoglobin 12.9 12.0 - 15.0 g/dL   HCT 40.3 36.0 - 46.0 %   MCV 83.4 80.0 - 100.0 fL   MCH 26.7 26.0 - 34.0 pg   MCHC 32.0 30.0 - 36.0 g/dL   RDW 14.8 11.5 - 15.5 %   Platelets 359 150 - 400 K/uL   nRBC 0.0 0.0 - 0.2 %  Troponin I (High Sensitivity)     Status: None   Collection Time: 01/01/19  5:17 PM  Result Value Ref Range   Troponin I (High Sensitivity) <2 <18 ng/L   ____________________________________________  EKG My review and personal interpretation at Time: 17:21   Indication: near syncope  Rate: 80  Rhythm: sinus Axis: normal Other: normal intervals, no stemi ____________________________________________  RADIOLOGY  I personally reviewed all radiographic images ordered to evaluate for the above acute complaints and reviewed radiology reports and findings.  These findings were personally discussed with the patient.  Please see medical record for radiology report.  ____________________________________________   PROCEDURES  Procedure(s) performed:  Procedures    Critical Care performed: no ____________________________________________   INITIAL IMPRESSION / ASSESSMENT AND PLAN / ED COURSE  Pertinent labs & imaging results that  were available during my care of the patient were reviewed by me and considered in my medical decision making (see chart for details).   DDX: asthma, bronchitis, dysrhythmia, acs, dehydration, hypoglycemia, anemia  Donna Reyes is a 43 y.o. who presents to the ED with symptoms as described above.  Patient nontoxic appearing well-appearing in no acute distress.  She is low risk by Wells criteria and is PERC negative.  EKG without any evidence of acute ischemia.  Donnell exam is soft and benign.  Neuro exam reassuring.  Possible dehydration possible hypoglycemic episode.  Patient also reporting having to use her inhaler more frequently.  May be having asthma exacerbation but do not appreciate any wheezing on exam right now.  Clinical Course as of Jan 01 2024  Tue Jan 01, 2019  2020 Patient feels well.  She is ambulating with steady gait.  Denies any chest pain or pressure.  No evidence of dysrhythmia or preexcitation syndrome.  Her troponin is negative.  This point I do believe she stable and appropriate for outpatient follow-up.   [PR]    Clinical Course User Index [PR] Merlyn Lot, MD    The patient was evaluated in Emergency Department today for the symptoms described in the history of present illness. He/she was evaluated in the context of the global COVID-19 pandemic, which necessitated consideration that the patient might be at risk for infection with the SARS-CoV-2 virus that causes COVID-19. Institutional protocols and algorithms that pertain to the evaluation of patients at risk for COVID-19 are in a state of rapid change based on information released by regulatory bodies including the CDC and federal and state organizations. These policies and algorithms were followed during the patient's care in the ED.  As part of my medical decision making, I reviewed the following data within the [place] notes reviewed and incorporated, Labs reviewed, notes  from prior ED visits and [place] Controlled Substance Database   ____________________________________________   FINAL CLINICAL IMPRESSION(S) / ED DIAGNOSES  Final diagnoses:  Lightheadedness  Near syncope      NEW MEDICATIONS STARTED DURING THIS VISIT:  New Prescriptions   ALBUTEROL (VENTOLIN HFA) 108 (90 BASE)  MCG/ACT INHALER    Inhale 2 puffs into the lungs every 6 (six) hours as needed for wheezing or shortness of breath.   PREDNISONE (DELTASONE) 20 MG TABLET    Take 2 tablets (40 mg total) by mouth daily for 5 days.     Note:  This document was prepared using Dragon voice recognition software and may include unintentional dictation errors.    Merlyn Lot, MD 01/01/19 2025

## 2019-01-01 NOTE — ED Triage Notes (Signed)
PT to ER states that she got a tattoo last week and has not felt like herself since that time.  PT states she got very dizzy at work today and was clammy.  Pt states area has been sore, but not unusually so.  Pt denies fever.

## 2019-01-01 NOTE — ED Notes (Signed)
Called lab and Levada Dy will add onto blood already in lab

## 2019-01-07 ENCOUNTER — Ambulatory Visit (INDEPENDENT_AMBULATORY_CARE_PROVIDER_SITE_OTHER): Payer: 59 | Admitting: Family Medicine

## 2019-01-07 ENCOUNTER — Encounter: Payer: Self-pay | Admitting: Family Medicine

## 2019-01-07 ENCOUNTER — Other Ambulatory Visit: Payer: Self-pay

## 2019-01-07 DIAGNOSIS — F419 Anxiety disorder, unspecified: Secondary | ICD-10-CM

## 2019-01-07 DIAGNOSIS — R42 Dizziness and giddiness: Secondary | ICD-10-CM | POA: Diagnosis not present
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
## 2019-01-09 ENCOUNTER — Encounter: Payer: 59 | Admitting: Obstetrics and Gynecology

## 2019-01-09 ENCOUNTER — Encounter: Payer: Self-pay | Admitting: Family Medicine

## 2019-01-13 ENCOUNTER — Ambulatory Visit (INDEPENDENT_AMBULATORY_CARE_PROVIDER_SITE_OTHER)
Admission: RE | Admit: 2019-01-13 | Discharge: 2019-01-13 | Disposition: A | Discharge status: Home / Self Care | Payer: 59 | Source: Ambulatory Visit

## 2019-01-13 DIAGNOSIS — H9201 Otalgia, right ear: Secondary | ICD-10-CM

## 2019-01-13 NOTE — Discharge Instructions (Addendum)
Take claritin every day. Use nasacort every day (2 sprays in right nostril, 1-2 in left). Increase water intake: avoid juice, coffee, soda, tea as this can dehydrate you. Return for worsening pain, ear discharge, sinus pain, fever.

## 2019-01-13 NOTE — ED Provider Notes (Signed)
Virtual Visit via Video Note:  Donna Reyes  initiated request for Telemedicine visit with [facility] team. I connected with Donna Reyes  on 01/13/2019 at 12:14 PM  for a synchronized telemedicine visit using a video enabled HIPPA compliant telemedicine application. I verified that I am speaking with Donna Reyes  using two identifiers. Donna Hall-Potvin, PA-C  was physically located in a [facility] site and Donna Reyes was located at a different location.   The limitations of evaluation and management by telemedicine as well as the availability of in-person appointments were discussed. Patient was informed that she  may incur a bill ( including co-pay) for this virtual visit encounter. Donna Reyes  expressed understanding and gave verbal consent to proceed with virtual visit.     History of Present Illness:Donna Reyes  is a 43 y.o. female with history of type 2 diabetes, hypertension, asthma, seasonal allergies presents with intermittent right-sided ear pain since Friday.  Patient states she has some occasional right-sided throat pain as well: States this is alleviated with Tylenol.  Patient has not been taking her Claritin for her seasonal allergies.  No known sick contacts, ear discharge, facial swelling.  Review of Systems  Constitutional: Negative for fever and malaise/fatigue.  HENT: Positive for ear pain and sore throat. Negative for congestion, nosebleeds and sinus pain.   Eyes: Negative for pain and redness.  Respiratory: Negative for cough, shortness of breath and wheezing.   Cardiovascular: Negative for chest pain and palpitations.  Gastrointestinal: Negative for abdominal pain, diarrhea and vomiting.  Musculoskeletal: Negative for joint pain and myalgias.  Skin: Negative for itching and rash.    Past Medical History:  Diagnosis Date  . Allergy   . Anemia   . Asthma   . Diabetes mellitus  without complication ([employer])   . Hypertension   . Sleep apnea     Allergies  Allergen Reactions  . Lisinopril         Observations/Objective: 43 year old female Sitting in no acute distress.  Patient is able to speak in full sentences without coughing, sneezing, wheezing.  No obvious facial swelling.  Assessment and Plan: 1.  Right ear pain Likely eustachian tube dysfunction second to allergic rhinitis is been untreated.  Will trial antihistamine, intranasal steroid over the next week and monitor for symptom improvement.  Follow Up Instructions: Patient to seek emergent evaluation in 1 week's time for persistent symptoms, or sooner if development of fever, worsening symptoms.   I discussed the assessment and treatment plan with the patient. The patient was provided an opportunity to ask questions and all were answered. The patient agreed with the plan and demonstrated an understanding of the instructions.   The patient was advised to call back or seek an in-person evaluation if the symptoms worsen or if the condition fails to improve as anticipated.  I provided 15 minutes of non-face-to-face time during this encounter.    [place], PA-C  01/13/2019 12:14 PM        Reyes, [place], PA-C 01/13/19 1214

## 2019-01-25 ENCOUNTER — Encounter: Payer: Self-pay | Admitting: Family Medicine

## 2019-01-29 ENCOUNTER — Encounter: Payer: Self-pay | Admitting: Family Medicine

## 2019-01-29 ENCOUNTER — Ambulatory Visit: Payer: 59 | Admitting: Family Medicine

## 2019-01-29 ENCOUNTER — Other Ambulatory Visit: Payer: Self-pay

## 2019-01-29 VITALS — BP 124/80 | HR 93 | Temp 97.3°F | Resp 14 | Ht 63.0 in | Wt 205.8 lb

## 2019-01-29 DIAGNOSIS — M792 Neuralgia and neuritis, unspecified: Secondary | ICD-10-CM | POA: Diagnosis not present

## 2019-01-29 DIAGNOSIS — B9689 Other specified bacterial agents as the cause of diseases classified elsewhere: Secondary | ICD-10-CM

## 2019-01-29 DIAGNOSIS — N760 Acute vaginitis: Secondary | ICD-10-CM

## 2019-01-29 DIAGNOSIS — H9201 Otalgia, right ear: Secondary | ICD-10-CM | POA: Diagnosis not present

## 2019-01-29 MED ORDER — AZELASTINE HCL 0.1 % NA SOLN: 1.0000 | Freq: Two times a day (BID) | NASAL | 1 refills | Status: DC

## 2019-01-29 MED ORDER — METRONIDAZOLE 0.75 % VA GEL: 1.0000 | Freq: Every day | VAGINAL | 0 refills | Status: AC

## 2019-01-29 NOTE — Progress Notes (Signed)
Name: Donna Reyes   MRN: [id number]    DOB: 08-Nov-1975   Date:01/29/2019       Progress Note  Subjective  Chief Complaint  Chief Complaint  Patient presents with  . Vaginitis    BV  . Nail Problem    burning of toes on right foot  . Otalgia    follow up    HPI  Recurrent BV: She notes that she has gotten recurrent BV since she was a teenager.  She was having a flare up a few days ago and this has since subsided.  She would like to have flagyl gel on hand in the even that she does have a flare later.  Typical flares include itching, fishy odor, and milky white discharge.  Discussed starting on probiotic as well to help prevent; did explain that some women tend to be carriers and that we should only treat if symptomatic.   Neuropathic pain in the right toes: Present intermittently in the right 3rd, 4th, and 5th toes. Occurs mostly when standing, lasting for about a minute.  Has been present for a few months.  She notes there is no numbness/tingling in the lower or upper leg, or the foot.  No known injury; wears supportive shoes (has tried different pairs to see if this was the issue.  No skin breakdown, redness, swelling.  She has seen podiatry in the past for fissure which has since healed.    RIGHT Otalgia: was seen by UC via virtual visit for this 01/13/2019 for otalgia and was told likely eustachian tube dysfunction - has been using claritin and flonase which did not help her symptoms.  Denies chest pain, shortness of breath, cough, fevers.   Patient Active Problem List   Diagnosis Date Noted  . Controlled type 2 diabetes mellitus with skin complication ([place]) 96/75/9163  . Palpable lymph node 08/17/2018  . Menorrhagia with regular cycle 08/14/2018  . Lymphadenopathy, axillary 10/27/2017  . Anemia 10/27/2017  . Chest pain 01/06/2016  . Essential hypertension 01/06/2016  . Mild intermittent asthma with acute exacerbation 01/06/2016  . Iron deficiency anemia 12/30/2015     Social History   Tobacco Use  . Smoking status: Never Smoker  . Smokeless tobacco: Never Used  Substance Use Topics  . Alcohol use: No     Current Outpatient Medications:  .  albuterol (PROVENTIL) (2.5 MG/3ML) 0.083% nebulizer solution, Take 2.5 mg by nebulization every 6 (six) hours as needed for wheezing or shortness of breath., Disp: , Rfl:  .  albuterol (VENTOLIN HFA) 108 (90 Base) MCG/ACT inhaler, Inhale 2 puffs into the lungs every 6 (six) hours as needed for wheezing or shortness of breath., Disp: 8 g, Rfl: 0 .  amLODipine (NORVASC) 5 MG tablet, Take 1 tablet (5 mg total) by mouth daily., Disp: 90 tablet, Rfl: 3 .  blood glucose meter kit and supplies KIT, Dispense based on patient and insurance preference. Use up to four times daily as directed. (FOR ICD-9 250.00, 250.01)., Disp: 1 each, Rfl: 0 .  cetirizine (ZYRTEC) 10 MG tablet, Take 1 tablet (10 mg total) by mouth daily., Disp: 30 tablet, Rfl: 0 .  escitalopram (LEXAPRO) 5 MG tablet, Take 1 tablet (5 mg total) by mouth daily., Disp: 90 tablet, Rfl: 1 .  Insulin Pen Needle (NOVOFINE) 32G X 6 MM MISC, 1 each by Does not apply route once a week., Disp: 50 each, Rfl: 1 .  Pitavastatin Calcium 2 MG TABS, Take 1 tablet (2 mg  total) by mouth daily., Disp: 90 tablet, Rfl: 3 .  Semaglutide (OZEMPIC, 1 MG/DOSE, [place]), Inject into the skin., Disp: , Rfl:  .  cholecalciferol (VITAMIN D3) 25 MCG (1000 UT) tablet, Take 1,000 Units by mouth daily., Disp: , Rfl:   Allergies  Allergen Reactions  . Lisinopril     I personally reviewed active problem list, medication list, allergies, notes from last encounter, lab results with the patient/caregiver today.  ROS  Ten systems reviewed and is negative except as mentioned in HPI  Objective  Vitals:   01/29/19 0722  BP: 124/80  Pulse: 93  Resp: 14  Temp: (!) 97.3 F (36.3 C)  TempSrc: Temporal  SpO2: 99%  Weight: 205 lb 12.8 oz (93.4 kg)  Height: '5\' 3"'  (1.6 m)   Body mass index  is 36.46 kg/m.  Nursing Note and Vital Signs reviewed.  Physical Exam  Constitutional: Patient appears well-developed and well-nourished. Obese. No distress.  HEENT: head atraumatic, normocephalic, pupils equal and reactive to light, Bilateral TM's without erythema or effusion,  bilateral maxillary and frontal sinuses are non-tender, neck supple without lymphadenopathy, throat within normal limits - no erythema or exudate, no tonsillar swelling Cardiovascular: Normal rate, regular rhythm and normal heart sounds.  No murmur heard. No BLE edema. Pulmonary/Chest: Effort normal and breath sounds clear bilaterally. No respiratory distress. Psychiatric: Patient has a normal mood and affect. behavior is normal. Judgment and thought content normal. Skin: No rash, no skin breakdown. See below for normal foot examination.  Diabetic Foot Exam - Simple   Simple Foot Form Diabetic Foot exam was performed with the following findings: Yes 01/29/2019  7:40 AM  Visual Inspection No deformities, no ulcerations, no other skin breakdown bilaterally: Yes Sensation Testing Intact to touch and monofilament testing bilaterally: Yes Pulse Check Posterior Tibialis and Dorsalis pulse intact bilaterally: Yes Comments     No results found for this or any previous visit (from the past 72 hour(s)).  Assessment & Plan  1. Acute otalgia, right - Continue Flonase and Oral Antihistamine, add azelastine; call back in 3-4 weeks if not improving and consider ENT referral - azelastine (ASTELIN) 0.1 % nasal spray; Place 1 spray into both nostrils 2 (two) times daily. Use in each nostril as directed  Dispense: 30 mL; Refill: 1  2. BV (bacterial vaginosis) - metroNIDAZOLE (METROGEL VAGINAL) 0.75 % vaginal gel; Place 1 Applicatorful vaginally at bedtime for 5 days.  Dispense: 70 g; Refill: 0  3. Neuropathic pain of right foot - Does not want to try gabapentin yet; will monitor symptoms for worsening and for patterns to  her pain.  -Red flags and when to present for emergency care or RTC including fever >101.59F, chest pain, shortness of breath, new/worsening/un-resolving symptoms, reviewed with patient at time of visit. Follow up and care instructions discussed and provided in AVS.

## 2019-02-06 ENCOUNTER — Inpatient Hospital Stay: Payer: 59 | Attending: Hematology and Oncology

## 2019-02-06 ENCOUNTER — Other Ambulatory Visit: Payer: 59

## 2019-02-15 ENCOUNTER — Telehealth: Payer: 59 | Admitting: Physician Assistant

## 2019-02-15 DIAGNOSIS — R21 Rash and other nonspecific skin eruption: Secondary | ICD-10-CM

## 2019-02-15 MED ORDER — CETIRIZINE HCL 10 MG PO TABS: 10.0000 mg | ORAL_TABLET | Freq: Every day | ORAL | 11 refills | Status: DC

## 2019-02-15 MED ORDER — TRIAMCINOLONE ACETONIDE 0.1 % EX CREA: 1.0000 "application " | TOPICAL_CREAM | Freq: Two times a day (BID) | CUTANEOUS | 0 refills | Status: DC

## 2019-02-15 MED ORDER — PREDNISONE 20 MG PO TABS: 40.0000 mg | ORAL_TABLET | Freq: Every day | ORAL | 0 refills | Status: DC

## 2019-02-15 NOTE — Progress Notes (Signed)
I spent 78min on this E-visit.   E Visit for Rash  We are sorry that you are not feeling well. Here is how we plan to help!  Zyrtec daily, prednisone orally, and triamcinolone cream.  See your doctor or clinic if you develop fever.   HOME CARE:   Take cool showers and avoid direct sunlight.  Apply cool compress or wet dressings.  Take a bath in an oatmeal bath.  Sprinkle content of one Aveeno packet under running faucet with comfortably warm water.  Bathe for 15-20 minutes, 1-2 times daily.  Pat dry with a towel. Do not rub the rash.  Use hydrocortisone cream.  Take an antihistamine like Benadryl for widespread rashes that itch.  The adult dose of Benadryl is 25-50 mg by mouth 4 times daily.  Caution:  This type of medication may cause sleepiness.  Do not drink alcohol, drive, or operate dangerous machinery while taking antihistamines.  Do not take these medications if you have prostate enlargement.  Read package instructions thoroughly on all medications that you take.  GET HELP RIGHT AWAY IF:   Symptoms don't go away after treatment.  Severe itching that persists.  If you rash spreads or swells.  If you rash begins to smell.  If it blisters and opens or develops a yellow-brown crust.  You develop a fever.  You have a sore throat.  You become short of breath.  MAKE SURE YOU:  Understand these instructions. Will watch your condition. Will get help right away if you are not doing well or get worse.  Thank you for choosing an e-visit. Your e-visit answers were reviewed by a board certified advanced clinical practitioner to complete your personal care plan. Depending upon the condition, your plan could have included both over the counter or prescription medications. Please review your pharmacy choice. Be sure that the pharmacy you have chosen is open so that you can pick up your prescription now.  If there is a problem you may message your provider in [place] to  have the prescription routed to another pharmacy. Your safety is important to [place]. If you have drug allergies check your prescription carefully.  For the next 24 hours, you can use MyChart to ask questions about today's visit, request a non-urgent call back, or ask for a work or school excuse from your e-visit provider. You will get an email in the next two days asking about your experience. I hope that your e-visit has been valuable and will speed your recovery.

## 2019-02-18 ENCOUNTER — Ambulatory Visit: Payer: 59 | Admitting: Family Medicine

## 2019-02-20 ENCOUNTER — Encounter: Payer: Self-pay | Admitting: Family Medicine

## 2019-02-21 ENCOUNTER — Encounter: Payer: Self-pay | Admitting: Family Medicine

## 2019-02-25 ENCOUNTER — Encounter: Payer: Self-pay | Admitting: Family Medicine

## 2019-02-25 ENCOUNTER — Ambulatory Visit: Payer: Self-pay | Admitting: Family Medicine

## 2019-02-25 ENCOUNTER — Other Ambulatory Visit: Payer: Self-pay

## 2019-02-25 VITALS — BP 132/76 | HR 97 | Temp 97.7°F | Resp 16 | Ht 62.0 in | Wt 204.9 lb

## 2019-02-25 DIAGNOSIS — L239 Allergic contact dermatitis, unspecified cause: Secondary | ICD-10-CM

## 2019-02-25 MED ORDER — FAMOTIDINE 20 MG PO TABS: 20.0000 mg | ORAL_TABLET | Freq: Two times a day (BID) | ORAL | 0 refills | Status: DC

## 2019-02-25 MED ORDER — CLOTRIMAZOLE-BETAMETHASONE 1-0.05 % EX CREA: 1.0000 "application " | TOPICAL_CREAM | Freq: Two times a day (BID) | CUTANEOUS | 0 refills | Status: DC

## 2019-02-25 NOTE — Progress Notes (Signed)
Name: Donna Reyes   MRN: [id number]    DOB: [birth year]   Date:02/25/2019       Progress Note  Subjective  Chief Complaint  Chief Complaint  Patient presents with  . Rash    on back for 3 weeks. Had a e-visit    HPI  Pt presents for concern for rash on back ongoing now for about 3 weeks.  She did an E-visit on 02/15/2019 and was given prednisone, triamcinolone 0.1% cream, and zyrtec.  The rash waxes and wanes - at times it will get hot, very itchy, and irritated.  She feels like the steroid cream makes it a bit worse because it burns.  She completed the PO prednisone, but this made her BG's go high, and the rash worsened once she was off of it.  No fevers/chills, bleeding, drainage.  She is allergic to [place] detergent - she was washing blankets in [place]ide recently - switched back to her usual detergent.  Reasonable accomodation request: She is struggling with breathing through her N95, she is also having to swab for COVID now at [facility].  The Kios[facility] is outside, with minimal barrier per her report. She is also wearing face shield, gown.   She is requesting paperwork be completed stating that she has asthma, DM, and HTN. I will review the paperwork and CDC guidance and complete accordingly.   Patient Active Problem List   Diagnosis Date Noted  . Controlled type 2 diabetes mellitus with skin complication ([place]) 63/84/6659  . Palpable lymph node 08/17/2018  . Menorrhagia with regular cycle 08/14/2018  . Lymphadenopathy, axillary 10/27/2017  . Anemia 10/27/2017  . Chest pain 01/06/2016  . Essential hypertension 01/06/2016  . Mild intermittent asthma with acute exacerbation 01/06/2016  . Iron deficiency anemia 12/30/2015    Social History   Tobacco Use  . Smoking status: Never Smoker  . Smokeless tobacco: Never Used  Substance Use Topics  . Alcohol use: No     Current Outpatient Medications:  .  albuterol (PROVENTIL) (2.5 MG/3ML) 0.083% nebulizer solution, Take  2.5 mg by nebulization every 6 (six) hours as needed for wheezing or shortness of breath., Disp: , Rfl:  .  albuterol (VENTOLIN HFA) 108 (90 Base) MCG/ACT inhaler, Inhale 2 puffs into the lungs every 6 (six) hours as needed for wheezing or shortness of breath., Disp: 8 g, Rfl: 0 .  amLODipine (NORVASC) 5 MG tablet, Take 1 tablet (5 mg total) by mouth daily., Disp: 90 tablet, Rfl: 3 .  azelastine (ASTELIN) 0.1 % nasal spray, Place 1 spray into both nostrils 2 (two) times daily. Use in each nostril as directed, Disp: 30 mL, Rfl: 1 .  blood glucose meter kit and supplies KIT, Dispense based on patient and insurance preference. Use up to four times daily as directed. (FOR ICD-9 250.00, 250.01)., Disp: 1 each, Rfl: 0 .  cetirizine (ZYRTEC) 10 MG tablet, Take 1 tablet (10 mg total) by mouth daily., Disp: 30 tablet, Rfl: 0 .  cholecalciferol (VITAMIN D3) 25 MCG (1000 UT) tablet, Take 1,000 Units by mouth daily., Disp: , Rfl:  .  escitalopram (LEXAPRO) 5 MG tablet, Take 1 tablet (5 mg total) by mouth daily., Disp: 90 tablet, Rfl: 1 .  Insulin Pen Needle (NOVOFINE) 32G X 6 MM MISC, 1 each by Does not apply route once a week., Disp: 50 each, Rfl: 1 .  Pitavastatin Calcium 2 MG TABS, Take 1 tablet (2 mg total) by mouth daily., Disp: 90 tablet,  Rfl: 3 .  Semaglutide (OZEMPIC, 1 MG/DOSE, [place]), Inject into the skin., Disp: , Rfl:  .  triamcinolone cream (KENALOG) 0.1 %, Apply 1 application topically 2 (two) times daily., Disp: 30 g, Rfl: 0 .  cetirizine (ZYRTEC) 10 MG tablet, Take 1 tablet (10 mg total) by mouth daily. (Patient not taking: Reported on 02/25/2019), Disp: 30 tablet, Rfl: 11 .  predniSONE (DELTASONE) 20 MG tablet, Take 2 tablets (40 mg total) by mouth daily with breakfast. (Patient not taking: Reported on 02/25/2019), Disp: 10 tablet, Rfl: 0  Allergies  Allergen Reactions  . Lisinopril     I personally reviewed active problem list, medication list, allergies, notes from last encounter, lab results  with the patient/caregiver today.  ROS  Ten systems reviewed and is negative except as mentioned in HPI  Objective  Vitals:   02/25/19 0719  BP: 132/76  Pulse: 97  Resp: 16  Temp: 97.7 F (36.5 C)  TempSrc: Temporal  SpO2: 97%  Weight: 204 lb 14.4 oz (92.9 kg)  Height: '5\' 2"'  (1.575 m)   Body mass index is 37.48 kg/m.  Nursing Note and Vital Signs reviewed.  Physical Exam  Constitutional: Patient appears well-developed and well-nourished. No distress.  HENT: Head: Normocephalic and atraumatic. Ears: bilateral TMs with no erythema or effusion; Nose: Nose normal. Mouth/Throat: Oropharynx is clear and moist. No oropharyngeal exudate or tonsillar swelling.  Eyes: Conjunctivae and EOM are normal. No scleral icterus.  Pupils are equal, round, and reactive to light.  Neck: Normal range of motion. Neck supple. No JVD present. No thyromegaly present.  Cardiovascular: Normal rate, regular rhythm and normal heart sounds.  No murmur heard. No BLE edema. Pulmonary/Chest: Effort normal and breath sounds normal. No respiratory distress. Abdominal: Soft. Bowel sounds are normal, no distension. There is no tenderness. No masses. Musculoskeletal: Normal range of motion, no joint effusions. No gross deformities Neurological: Pt is alert and oriented to person, place, and time. No cranial nerve deficit. Coordination, balance, strength, speech and gait are normal.  Skin: Skin is warm and dry. No erythema. The low back has scattered urticaric lesions, there is a patch that covers the bilateral lower back that is slightly hyperpigmented which she reports as new. Psychiatric: Patient has a normal mood and affect. behavior is normal. Judgment and thought content normal.   No results found for this or any previous visit (from the past 72 hour(s)).  Assessment & Plan  1. Allergic dermatitis - Continue Zyrtec, do not use any new products. - clotrimazole-betamethasone (LOTRISONE) cream; Apply 1  application topically 2 (two) times daily.  Dispense: 30 g; Refill: 0 - famotidine (PEPCID) 20 MG tablet; Take 1 tablet (20 mg total) by mouth 2 (two) times daily.  Dispense: 30 tablet; Refill: 0 -Red flags and when to present for emergency care or RTC including fever >101.3F, chest pain, shortness of breath, new/worsening/un-resolving symptoms, reviewed with patient at time of visit. Follow up and care instructions discussed and provided in AVS.

## 2019-03-05 ENCOUNTER — Encounter: Payer: Self-pay | Admitting: Family Medicine

## 2019-03-05 ENCOUNTER — Ambulatory Visit (INDEPENDENT_AMBULATORY_CARE_PROVIDER_SITE_OTHER): Payer: 59 | Admitting: Family Medicine

## 2019-03-05 ENCOUNTER — Other Ambulatory Visit: Payer: Self-pay

## 2019-03-05 DIAGNOSIS — I10 Essential (primary) hypertension: Secondary | ICD-10-CM

## 2019-03-05 DIAGNOSIS — E1169 Type 2 diabetes mellitus with other specified complication: Secondary | ICD-10-CM

## 2019-03-05 DIAGNOSIS — R599 Enlarged lymph nodes, unspecified: Secondary | ICD-10-CM

## 2019-03-05 DIAGNOSIS — F419 Anxiety disorder, unspecified: Secondary | ICD-10-CM

## 2019-03-05 DIAGNOSIS — R590 Localized enlarged lymph nodes: Secondary | ICD-10-CM

## 2019-03-05 DIAGNOSIS — D500 Iron deficiency anemia secondary to blood loss (chronic): Secondary | ICD-10-CM

## 2019-03-05 DIAGNOSIS — J4531 Mild persistent asthma with (acute) exacerbation: Secondary | ICD-10-CM

## 2019-03-05 DIAGNOSIS — E6609 Other obesity due to excess calories: Secondary | ICD-10-CM

## 2019-03-05 DIAGNOSIS — N920 Excessive and frequent menstruation with regular cycle: Secondary | ICD-10-CM

## 2019-03-05 DIAGNOSIS — E11628 Type 2 diabetes mellitus with other skin complications: Secondary | ICD-10-CM

## 2019-03-05 DIAGNOSIS — E785 Hyperlipidemia, unspecified: Secondary | ICD-10-CM

## 2019-03-05 HISTORY — DX: Anxiety disorder, unspecified: F41.9

## 2019-03-05 MED ORDER — HYDROXYZINE HCL 10 MG PO TABS: 10.0000 mg | ORAL_TABLET | Freq: Three times a day (TID) | ORAL | 0 refills | Status: DC | PRN

## 2019-03-05 MED ORDER — OZEMPIC (1 MG/DOSE) 2 MG/1.5ML ~~LOC~~ SOPN: 1.0000 mg | PEN_INJECTOR | SUBCUTANEOUS | 3 refills | Status: DC

## 2019-03-05 MED ORDER — FLUTICASONE-SALMETEROL 100-50 MCG/DOSE IN AEPB: 1.0000 | INHALATION_SPRAY | Freq: Two times a day (BID) | RESPIRATORY_TRACT | 3 refills | Status: DC

## 2019-03-05 MED ORDER — MONTELUKAST SODIUM 10 MG PO TABS: 10.0000 mg | ORAL_TABLET | Freq: Every day | ORAL | 3 refills | Status: DC
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
## 2019-03-07 ENCOUNTER — Encounter: Payer: Self-pay | Admitting: Family Medicine

## 2019-03-07 DIAGNOSIS — J4531 Mild persistent asthma with (acute) exacerbation: Secondary | ICD-10-CM

## 2019-03-07 DIAGNOSIS — J989 Respiratory disorder, unspecified: Secondary | ICD-10-CM

## 2019-03-08 ENCOUNTER — Encounter: Payer: Self-pay | Admitting: Family Medicine

## 2019-03-08 MED ORDER — PREDNISONE 10 MG PO TABS: ORAL_TABLET | ORAL | 0 refills | Status: DC

## 2019-03-31 ENCOUNTER — Encounter: Payer: Self-pay | Admitting: Family Medicine

## 2019-04-25 ENCOUNTER — Institutional Professional Consult (permissible substitution): Payer: 59 | Admitting: Pulmonary Disease

## 2019-05-02 NOTE — Progress Notes (Deleted)
[facility]  [address] [place], [place] [address] Phone: [phone]  Fax: [phone]   Clinic Day:  05/02/2019  Referring physician: Hubbard Hartshorn, FNP  Chief Complaint: Donna Reyes is a 44 y.o. female with iron deficiency anemia and reactive thrombocytosis who is seen for 2 month assessment.    HPI: The patient was last seen in the hematology clinic on 08/14/2018 for new patient assessment. At that time, she felt a little tired. She denied any B symptoms. She had ice pica. Exam revealed a palpable right axillary node. She received Feraheme.   She was presented to [name], NP for LUQ soft tissue edema of the chest wall on 08/24/2018. She would be referred back to surgery if mammogram was unremarkable. Bilateral mammogram and pelvic ultrasound were ordered.   During the interim, she has felt "good". Her energy level is improving. Her menstrual cycle now last 5 days instead of 7 days; she notes x 2 days of heavy flow at the beginning of her cycle.   Her diet consist of iron rich foods. She denies any ice pica.  Her lymph node has not increased in size and she denies any pain. She is not concerned about the lymph node at this time.    Past Medical History:  Diagnosis Date  . Allergy   . Anemia   . Asthma   . Diabetes mellitus without complication ([place])   . Hypertension   . Sleep apnea     Past Surgical History:  Procedure Laterality Date  . AXILLARY LYMPH NODE BIOPSY Right 05/25/2017   LYMPHADENITIS. NEGATIVE FOR MALIGNANCY.   Marland Kitchen BREAST BIOPSY Right 05/25/2017   neg/NEGATIVE FOR CARCINOMA. INFLAMMATION AND SQUAMOUS METAPLASIA OF A FOCAL DUCT   . CESAREAN SECTION     times 3  . TUBAL LIGATION      Family History  Problem Relation Age of Onset  . Diabetes Mother   . Heart failure Mother   . Kidney disease Mother   . Diabetes Maternal Grandmother   . Breast cancer Maternal Grandmother 65  . Hypertension Father   .  Hyperlipidemia Father   . Atrial fibrillation Sister   . ADD / ADHD Son   . Heart attack Maternal Grandfather   . Diabetes Paternal Grandmother   . Hypertension Paternal Grandmother   . Prostate cancer Paternal Grandfather   . Polycystic ovary syndrome Sister   . Polycystic ovary syndrome Sister   . Ovarian cancer Neg Hx   . Colon cancer Neg Hx     Social History:  reports that she has never smoked. She has never used smokeless tobacco. She reports that she does not drink alcohol or use drugs. She previously lived in [place]. She moved to [place] in 2015 to be near her husband's family. She is now divorced. She does not smoke or drink. She denies exposure to radiation or toxins. She is an Corporate treasurer at [employer]. The patient is alone today.  Allergies:  Allergies  Allergen Reactions  . Lisinopril     Current Medications: Current Outpatient Medications  Medication Sig Dispense Refill  . albuterol (PROVENTIL) (2.5 MG/3ML) 0.083% nebulizer solution Take 2.5 mg by nebulization every 6 (six) hours as needed for wheezing or shortness of breath.    Marland Kitchen albuterol (VENTOLIN HFA) 108 (90 Base) MCG/ACT inhaler Inhale 2 puffs into the lungs every 6 (six) hours as needed for wheezing or shortness of breath. 8 g 0  . amLODipine (NORVASC)  5 MG tablet Take 1 tablet (5 mg total) by mouth daily. 90 tablet 3  . azelastine (ASTELIN) 0.1 % nasal spray Place 1 spray into both nostrils 2 (two) times daily. Use in each nostril as directed 30 mL 1  . blood glucose meter kit and supplies KIT Dispense based on patient and insurance preference. Use up to four times daily as directed. (FOR ICD-9 250.00, 250.01). 1 each 0  . cetirizine (ZYRTEC) 10 MG tablet Take 1 tablet (10 mg total) by mouth daily. 30 tablet 11  . cholecalciferol (VITAMIN D3) 25 MCG (1000 UT) tablet Take 1,000 Units by mouth daily.    . clotrimazole-betamethasone (LOTRISONE) cream Apply 1 application topically 2 (two) times daily. 30 g  0  . famotidine (PEPCID) 20 MG tablet Take 1 tablet (20 mg total) by mouth 2 (two) times daily. 30 tablet 0  . Fluticasone-Salmeterol (ADVAIR DISKUS) 100-50 MCG/DOSE AEPB Inhale 1 puff into the lungs 2 (two) times daily. 60 each 3  . hydrOXYzine (ATARAX/VISTARIL) 10 MG tablet Take 1 tablet (10 mg total) by mouth 3 (three) times daily as needed for anxiety. 30 tablet 0  . Insulin Pen Needle (NOVOFINE) 32G X 6 MM MISC 1 each by Does not apply route once a week. 50 each 1  . montelukast (SINGULAIR) 10 MG tablet Take 1 tablet (10 mg total) by mouth at bedtime. 30 tablet 3  . Pitavastatin Calcium 2 MG TABS Take 1 tablet (2 mg total) by mouth daily. 90 tablet 3  . predniSONE (DELTASONE) 10 MG tablet Day1:5tabs, Day2:4tabs, Day3:3tabs, Day4:2tabs, Day5:1tab 15 tablet 0  . Semaglutide, 1 MG/DOSE, (OZEMPIC, 1 MG/DOSE,) 2 MG/1.5ML SOPN Inject 1 mg into the skin once a week. 3 pen 3   No current facility-administered medications for this visit.    Review of Systems  Constitutional: Negative for chills, diaphoresis, fever, malaise/fatigue and weight loss (stable).       Feels "good".  HENT: Negative.  Negative for congestion, hearing loss, nosebleeds, sinus pain and sore throat.   Eyes: Negative.  Negative for blurred vision and double vision.  Respiratory: Negative.  Negative for cough, shortness of breath and wheezing.   Cardiovascular: Negative.  Negative for chest pain, palpitations, orthopnea, leg swelling and PND.  Gastrointestinal: Negative for abdominal pain, blood in stool, constipation, diarrhea, heartburn, melena, nausea and vomiting.       Ice pica resolved.  Genitourinary: Negative for dysuria, frequency, hematuria and urgency.       Heavy menses, improved.  Musculoskeletal: Negative.  Negative for back pain, joint pain and myalgias.  Skin: Negative for itching and rash.  Neurological: Negative.  Negative for dizziness, sensory change, focal weakness, weakness and headaches.    Endo/Heme/Allergies: Negative.  Does not bruise/bleed easily.  Psychiatric/Behavioral: Negative.  Negative for depression and memory loss. The patient is not nervous/anxious and does not have insomnia.   All other systems reviewed and are negative.  Performance status (ECOG):  0  Vitals There were no vitals taken for this visit.  Physical Exam  Constitutional: She is oriented to person, place, and time. She appears well-developed and well-nourished. No distress.  HENT:  Head: Normocephalic and atraumatic.  Mouth/Throat: Oropharynx is clear and moist. No oropharyngeal exudate.  Short, dark hair. Wearing mask.   Eyes: Pupils are equal, round, and reactive to light. Conjunctivae and EOM are normal.  Contacts.  Neck: No JVD present.  Cardiovascular: Normal rate, regular rhythm and normal heart sounds. Exam reveals no gallop and no friction  rub.  No murmur heard. Pulmonary/Chest: Effort normal and breath sounds normal. No respiratory distress. She has no wheezes. She has no rales.  Abdominal: Soft. Bowel sounds are normal. She exhibits no distension. There is no abdominal tenderness. There is no rebound and no guarding.  Musculoskeletal:        General: No edema. Normal range of motion.     Cervical back: Normal range of motion and neck supple.  Lymphadenopathy:    She has no cervical adenopathy.    She has no axillary adenopathy.       Right: No supraclavicular adenopathy present.       Left: No supraclavicular adenopathy present.  Neurological: She is alert and oriented to person, place, and time.  Skin: Skin is warm and dry. No rash noted. She is not diaphoretic. No erythema. No pallor.  Psychiatric: She has a normal mood and affect. Her behavior is normal. Judgment and thought content normal.  Nursing note and vitals reviewed.   No visits with results within 3 Day(s) from this visit.  Latest known visit with results is:  Admission on 01/01/2019, Discharged on 01/01/2019   Component Date Value Ref Range Status  . Sodium 01/01/2019 137  135 - 145 mmol/L Final  . Potassium 01/01/2019 4.2  3.5 - 5.1 mmol/L Final  . Chloride 01/01/2019 104  98 - 111 mmol/L Final  . CO2 01/01/2019 26  22 - 32 mmol/L Final  . Glucose, Bld 01/01/2019 119* 70 - 99 mg/dL Final  . BUN 01/01/2019 15  6 - 20 mg/dL Final  . Creatinine, Ser 01/01/2019 0.88  0.44 - 1.00 mg/dL Final  . Calcium 01/01/2019 9.5  8.9 - 10.3 mg/dL Final  . GFR calc non Af Amer 01/01/2019 >60  >60 mL/min Final  . GFR calc Af Amer 01/01/2019 >60  >60 mL/min Final  . Anion gap 01/01/2019 7  5 - 15 Final   Performed at [facility], [address] [place], [place] [address]  . WBC 01/01/2019 5.4  4.0 - 10.5 K/uL Final  . RBC 01/01/2019 4.83  3.87 - 5.11 MIL/uL Final  . Hemoglobin 01/01/2019 12.9  12.0 - 15.0 g/dL Final  . HCT 01/01/2019 40.3  36.0 - 46.0 % Final  . MCV 01/01/2019 83.4  80.0 - 100.0 fL Final  . MCH 01/01/2019 26.7  26.0 - 34.0 pg Final  . MCHC 01/01/2019 32.0  30.0 - 36.0 g/dL Final  . RDW 01/01/2019 14.8  11.5 - 15.5 % Final  . Platelets 01/01/2019 359  150 - 400 K/uL Final  . nRBC 01/01/2019 0.0  0.0 - 0.2 % Final   Performed at [facility], [address], [place], [place] [address]  . Troponin I (High Sensitivity) 01/01/2019 <2  <18 ng/L Final   Comment: (NOTE) Elevated high sensitivity troponin I (hsTnI) values and significant  changes across serial measurements may suggest ACS but many other  chronic and acute conditions are known to elevate hsTnI results.  Refer to the "Links" section for chest pain algorithms and additional  guidance. Performed at [facility], [address] [place], [place] [address]     Assessment:  Donna Reyes is a 44 y.o. female with iron deficiency anemia and reactive thrombocytosis.  Etiology is felt secondary to menorrhagia.  Diet is fairly good.  She is intolerant of oral iron.   She has a history of an ulcer  at a young age.  She had an EGD in 2015  in [place].  Report is unavailable. She is no longer on a PPI.  Work-up on 12/30/2015 revealed a hematocrit of 27.4, hemoglobin 8.0, MCV 60.3, platelets 391,000, and WBC 4400.  Ferritin was 4 with an iron saturation of 2% and a TIBC of 499.   Normal studies included:  B12, folate , [place], ANA, and hemoglobin electrophoresis.  She previously received IV iron x 1 while living in [place].  She received Feraheme on 01/08/2016, 01/15/2016, 11/10/2017, 11/17/2017, and 01/05/2018.   Ferritin has been followed: 4 on 01/01/2016, 33 on 02/29/2016, 4 on 10/31/2017, 16 on 01/04/2018, 9 on 06/11/2018, 6 on 08/13/2018, and 9 on 11/05/2018.   She has a history of right breast lump and adenopathy in 2019.  Biopsies on 05/25/2017 were negative for malignancy.   Pathology revealed lymphadenitis.    Symptomatically, she feels "fine".  Menses has slightly improved.  Exam is unremarkable.  Plan: 1.   Review labs from 11/05/2018. 2.   Iron deficiency anemia             Hematocrit 38.1.  Hemoglobin 12.4.  MCV 79.4.    Ferritin 9.  Ferritin goal 100.             Etiology secondary to heavy menses, slightly improved.             Discuss plan for reinitiation of IV iron.  Feraheme switched to Venofer secondary to insurance coverage.  Venofer today and weekly x 2 (total 3). 3.   Bleeding diathesis             Patient with heavy menses.             Follow-up with gynecology.             Review plan to r/o von Willebrand's disease with next heavy menses (patient to call for testing).  4.  Right axillary adenopathy             No palpable axillary adenopathy on today's exam. 5.   RTC in 3 months for labs (CBC, ferritin). 6.   RTC in 6 months for MD assessment, labs (CBC with diff, ferritin-day before), and +/- Venofer.  I discussed the assessment and treatment plan with the patient.  The patient was provided an opportunity to ask questions and all were answered.  The patient  agreed with the plan and demonstrated an understanding of the instructions.  The patient was advised to call back if the symptoms worsen or if the condition fails to improve as anticipated.   Lequita Asal, MD, PhD    05/02/2019, 11:27 AM  I, Selena Batten, am acting as scribe for [employer]. Mike Gip, MD, PhD.  I, Jaslyn Bansal C. Mike Gip, MD, have reviewed the above documentation for accuracy and completeness, and I agree with the above.

## 2019-05-06 ENCOUNTER — Inpatient Hospital Stay: Payer: Self-pay

## 2019-05-06 NOTE — Progress Notes (Deleted)
[facility]  [address] [place], [place] [address] Phone: [phone]  Fax: [phone]   Clinic Day:  05/06/2019  Referring physician: Hubbard Hartshorn, FNP  Chief Complaint: Donna Reyes is a 44 y.o. female with iron deficiency anemia and reactive thrombocytosis who is seen for 4 month assessment.    HPI: The patient was last seen in the hematology clinic on 11/06/2018. 08/14/2018 for new patient assessment. At that time, she felt "fine". Menses had improved slightly.  Exam was unremarkable.  Hematocrit was 38.1, hemoglobin 12.4, and MCV 79.4.  Ferritin was 9.  Von Willebrand panel was normal (factor VIII 183% and von Willebrand antigen 128%).  She was switched from Feraheme to Venofer secondary to insurance coverage.  She received weekly Venofer x 3 (11/06/2018 - 11/23/2018).  She presented to [name], NP for new patient assessment on 08/24/2018.  She was noted to have left upper chest swelling on and off with stress.  She denied any tenderness or nipple discharge.  She was noted to have a history of a breast lump in 2019 s/p biopsy.  Since that time, she haas had swollen right axillary nodes.  She was followed by Dr Bary Castilla.  Bilateral mammogram is scheduled for 05/14/2019.   She was seen by [name], NP via telemedicine on 03/05/2019 .  She was to follow-up with Dr Bary Castilla regarding the axillary lymph node.  Bilateral mammogram is scheduled for 05/14/2019.     During the interim, she has felt "good". Her energy level is improving. Her menstrual cycle now last 5 days instead of 7 days; she notes x 2 days of heavy flow at the beginning of her cycle.   Her diet consist of iron rich foods. She denies any ice pica.  Her lymph node has not increased in size and she denies any pain. She is not concerned about the lymph node at this time.    Past Medical History:  Diagnosis Date  . Allergy   . Anemia   . Asthma   . Diabetes mellitus  without complication ([place])   . Hypertension   . Sleep apnea     Past Surgical History:  Procedure Laterality Date  . AXILLARY LYMPH NODE BIOPSY Right 05/25/2017   LYMPHADENITIS. NEGATIVE FOR MALIGNANCY.   Marland Kitchen BREAST BIOPSY Right 05/25/2017   neg/NEGATIVE FOR CARCINOMA. INFLAMMATION AND SQUAMOUS METAPLASIA OF A FOCAL DUCT   . CESAREAN SECTION     times 3  . TUBAL LIGATION      Family History  Problem Relation Age of Onset  . Diabetes Mother   . Heart failure Mother   . Kidney disease Mother   . Diabetes Maternal Grandmother   . Breast cancer Maternal Grandmother 26  . Hypertension Father   . Hyperlipidemia Father   . Atrial fibrillation Sister   . ADD / ADHD Son   . Heart attack Maternal Grandfather   . Diabetes Paternal Grandmother   . Hypertension Paternal Grandmother   . Prostate cancer Paternal Grandfather   . Polycystic ovary syndrome Sister   . Polycystic ovary syndrome Sister   . Ovarian cancer Neg Hx   . Colon cancer Neg Hx     Social History:  reports that she has never smoked. She has never used smokeless tobacco. She reports that she does not drink alcohol or use drugs. She previously lived in [place]. She moved to [place] in 2015 to be near her husband's family. She is now divorced. She  does not smoke or drink. She denies exposure to radiation or toxins. She is an Corporate treasurer at [employer]. The patient is alone today.  Allergies:  Allergies  Allergen Reactions  . Lisinopril     Current Medications: Current Outpatient Medications  Medication Sig Dispense Refill  . albuterol (PROVENTIL) (2.5 MG/3ML) 0.083% nebulizer solution Take 2.5 mg by nebulization every 6 (six) hours as needed for wheezing or shortness of breath.    Marland Kitchen albuterol (VENTOLIN HFA) 108 (90 Base) MCG/ACT inhaler Inhale 2 puffs into the lungs every 6 (six) hours as needed for wheezing or shortness of breath. 8 g 0  . amLODipine (NORVASC) 5 MG tablet Take 1 tablet (5 mg total) by mouth  daily. 90 tablet 3  . azelastine (ASTELIN) 0.1 % nasal spray Place 1 spray into both nostrils 2 (two) times daily. Use in each nostril as directed 30 mL 1  . blood glucose meter kit and supplies KIT Dispense based on patient and insurance preference. Use up to four times daily as directed. (FOR ICD-9 250.00, 250.01). 1 each 0  . cetirizine (ZYRTEC) 10 MG tablet Take 1 tablet (10 mg total) by mouth daily. 30 tablet 11  . cholecalciferol (VITAMIN D3) 25 MCG (1000 UT) tablet Take 1,000 Units by mouth daily.    . clotrimazole-betamethasone (LOTRISONE) cream Apply 1 application topically 2 (two) times daily. 30 g 0  . famotidine (PEPCID) 20 MG tablet Take 1 tablet (20 mg total) by mouth 2 (two) times daily. 30 tablet 0  . Fluticasone-Salmeterol (ADVAIR DISKUS) 100-50 MCG/DOSE AEPB Inhale 1 puff into the lungs 2 (two) times daily. 60 each 3  . hydrOXYzine (ATARAX/VISTARIL) 10 MG tablet Take 1 tablet (10 mg total) by mouth 3 (three) times daily as needed for anxiety. 30 tablet 0  . Insulin Pen Needle (NOVOFINE) 32G X 6 MM MISC 1 each by Does not apply route once a week. 50 each 1  . montelukast (SINGULAIR) 10 MG tablet Take 1 tablet (10 mg total) by mouth at bedtime. 30 tablet 3  . Pitavastatin Calcium 2 MG TABS Take 1 tablet (2 mg total) by mouth daily. 90 tablet 3  . predniSONE (DELTASONE) 10 MG tablet Day1:5tabs, Day2:4tabs, Day3:3tabs, Day4:2tabs, Day5:1tab 15 tablet 0  . Semaglutide, 1 MG/DOSE, (OZEMPIC, 1 MG/DOSE,) 2 MG/1.5ML SOPN Inject 1 mg into the skin once a week. 3 pen 3   No current facility-administered medications for this visit.    Review of Systems  Constitutional: Negative for chills, diaphoresis, fever, malaise/fatigue and weight loss (stable).       Feels "good".  HENT: Negative.  Negative for congestion, hearing loss, nosebleeds, sinus pain and sore throat.   Eyes: Negative.  Negative for blurred vision and double vision.  Respiratory: Negative.  Negative for cough, shortness of  breath and wheezing.   Cardiovascular: Negative.  Negative for chest pain, palpitations, orthopnea, leg swelling and PND.  Gastrointestinal: Negative for abdominal pain, blood in stool, constipation, diarrhea, heartburn, melena, nausea and vomiting.       Ice pica resolved.  Genitourinary: Negative for dysuria, frequency, hematuria and urgency.       Heavy menses, improved.  Musculoskeletal: Negative.  Negative for back pain, joint pain and myalgias.  Skin: Negative for itching and rash.  Neurological: Negative.  Negative for dizziness, sensory change, focal weakness, weakness and headaches.  Endo/Heme/Allergies: Negative.  Does not bruise/bleed easily.  Psychiatric/Behavioral: Negative.  Negative for depression and memory loss. The patient is not nervous/anxious and does  not have insomnia.   All other systems reviewed and are negative.  Performance status (ECOG):  0  Vitals There were no vitals taken for this visit.  Physical Exam  Constitutional: She is oriented to person, place, and time. She appears well-developed and well-nourished. No distress.  HENT:  Head: Normocephalic and atraumatic.  Mouth/Throat: Oropharynx is clear and moist. No oropharyngeal exudate.  Short, dark hair. Wearing mask.   Eyes: Pupils are equal, round, and reactive to light. Conjunctivae and EOM are normal.  Contacts.  Neck: No JVD present.  Cardiovascular: Normal rate, regular rhythm and normal heart sounds. Exam reveals no gallop and no friction rub.  No murmur heard. Pulmonary/Chest: Effort normal and breath sounds normal. No respiratory distress. She has no wheezes. She has no rales.  Abdominal: Soft. Bowel sounds are normal. She exhibits no distension. There is no abdominal tenderness. There is no rebound and no guarding.  Musculoskeletal:        General: No edema. Normal range of motion.     Cervical back: Normal range of motion and neck supple.  Lymphadenopathy:    She has no cervical adenopathy.      She has no axillary adenopathy.       Right: No supraclavicular adenopathy present.       Left: No supraclavicular adenopathy present.  Neurological: She is alert and oriented to person, place, and time.  Skin: Skin is warm and dry. No rash noted. She is not diaphoretic. No erythema. No pallor.  Psychiatric: She has a normal mood and affect. Her behavior is normal. Judgment and thought content normal.  Nursing note and vitals reviewed.   No visits with results within 3 Day(s) from this visit.  Latest known visit with results is:  Admission on 01/01/2019, Discharged on 01/01/2019  Component Date Value Ref Range Status  . Sodium 01/01/2019 137  135 - 145 mmol/L Final  . Potassium 01/01/2019 4.2  3.5 - 5.1 mmol/L Final  . Chloride 01/01/2019 104  98 - 111 mmol/L Final  . CO2 01/01/2019 26  22 - 32 mmol/L Final  . Glucose, Bld 01/01/2019 119* 70 - 99 mg/dL Final  . BUN 01/01/2019 15  6 - 20 mg/dL Final  . Creatinine, Ser 01/01/2019 0.88  0.44 - 1.00 mg/dL Final  . Calcium 01/01/2019 9.5  8.9 - 10.3 mg/dL Final  . GFR calc non Af Amer 01/01/2019 >60  >60 mL/min Final  . GFR calc Af Amer 01/01/2019 >60  >60 mL/min Final  . Anion gap 01/01/2019 7  5 - 15 Final   Performed at [facility], [address], [place], [place] [address]  . WBC 01/01/2019 5.4  4.0 - 10.5 K/uL Final  . RBC 01/01/2019 4.83  3.87 - 5.11 MIL/uL Final  . Hemoglobin 01/01/2019 12.9  12.0 - 15.0 g/dL Final  . HCT 01/01/2019 40.3  36.0 - 46.0 % Final  . MCV 01/01/2019 83.4  80.0 - 100.0 fL Final  . MCH 01/01/2019 26.7  26.0 - 34.0 pg Final  . MCHC 01/01/2019 32.0  30.0 - 36.0 g/dL Final  . RDW 01/01/2019 14.8  11.5 - 15.5 % Final  . Platelets 01/01/2019 359  150 - 400 K/uL Final  . nRBC 01/01/2019 0.0  0.0 - 0.2 % Final   Performed at [facility], [address], [place], [place] [address]  . Troponin I (High Sensitivity) 01/01/2019 <2  <18 ng/L Final   Comment: (NOTE) Elevated high  sensitivity troponin I (  hsTnI) values and significant  changes across serial measurements may suggest ACS but many other  chronic and acute conditions are known to elevate hsTnI results.  Refer to the "Links" section for chest pain algorithms and additional  guidance. Performed at [facility], [address] [place], [place] [address]     Assessment:  Donna Reyes is a 44 y.o. female with iron deficiency anemia and reactive thrombocytosis.  Etiology is felt secondary to menorrhagia.  Diet is fairly good.  She is intolerant of oral iron.   She has a history of an ulcer at a young age.  She had an EGD in 2015 in [place].  Report is unavailable. She is no longer on a PPI.  Work-up on 12/30/2015 revealed a hematocrit of 27.4, hemoglobin 8.0, MCV 60.3, platelets 391,000, and WBC 4400.  Ferritin was 4 with an iron saturation of 2% and a TIBC of 499.   Normal studies included:  B12, folate , [place], ANA, and hemoglobin electrophoresis.  She previously received IV iron x 1 while living in [place].  She received Feraheme on 01/08/2016, 01/15/2016, 11/10/2017, 11/17/2017, and 01/05/2018.  She received Venofer x 3 (11/06/2018 - 11/23/2018).  Ferritin has been followed: 4 on 01/01/2016, 33 on 02/29/2016, 4 on 10/31/2017, 16 on 01/04/2018, 9 on 06/11/2018, 6 on 08/13/2018, 9 on 11/05/2018, and 5 on 05/08/2019.   She has a history of right breast lump and adenopathy in 2019.  Biopsies on 05/25/2017 were negative for malignancy.   Pathology revealed lymphadenitis.    Symptomatically,  Plan: 1.   Review labs from 05/08/2019.   2.   Iron deficiency anemia             Hematocrit 33.9.  Hemoglobin 10.5.  MCV 80.3.    Ferritin 5.  Ferritin goal 100.              Etiology secondary to heavy menses, slightly improved.             Discuss plan for reinitiation of IV iron.  Feraheme switched to Venofer secondary to insurance coverage.  Venofer today and weekly x 2 (total 3). 3.    Bleeding diathesis             Patient with heavy menses.             Follow-up with gynecology.             Review plan to r/o von Willebrand's disease with next heavy menses (patient to call for testing).  4.  Right axillary adenopathy             No palpable axillary adenopathy on today's exam. 5.   RTC in 3 months for labs (CBC, ferritin). 6.   RTC in 6 months for MD assessment, labs (CBC with diff, ferritin-day before), and +/- Venofer.  I discussed the assessment and treatment plan with the patient.  The patient was provided an opportunity to ask questions and all were answered.  The patient agreed with the plan and demonstrated an understanding of the instructions.  The patient was advised to call back if the symptoms worsen or if the condition fails to improve as anticipated.   Lequita Asal, MD, PhD    05/06/2019, 1:46 PM  I, Selena Batten, am acting as scribe for [employer]. Mike Gip, MD, PhD.  I, Reynalda Canny C. Mike Gip, MD, have reviewed the above documentation for accuracy and completeness, and I agree with the above.

## 2019-05-07 ENCOUNTER — Ambulatory Visit: Payer: 59

## 2019-05-07 ENCOUNTER — Encounter: Payer: Self-pay | Admitting: Hematology and Oncology

## 2019-05-07 ENCOUNTER — Inpatient Hospital Stay: Payer: Self-pay | Admitting: Hematology and Oncology

## 2019-05-07 NOTE — Progress Notes (Signed)
No new changes noted today. The patient Name and DOB has been verified by phone today. 

## 2019-05-07 NOTE — Progress Notes (Signed)
[facility]  [address], [address] [place], [place] [address] Phone: [phone]  Fax: [phone]   Clinic Day:  05/09/2019  Referring physician: Hubbard Hartshorn, FNP  Chief Complaint: Donna Reyes is a 44 y.o. female with iron deficiency anemia andreactivethrombocytosis who is seen for 6 month assessment.   HPI: The patient was last seen in the hematology clinic on 11/06/2018.  At that time, she felt "fine".  Menses had improved slightly.  Exam was unremarkable.  Hematocrit was 38.1, hemoglobin 12.4, and MCV 79.4.  Ferritin was 9.  Von Willebrand panel was normal (factor VIII 183% and von Willebrand antigen 128%).  She was switched from Feraheme to Venofer secondary to insurance coverage.  She received weekly Venofer x 3 (11/06/2018 - 11/23/2018).  She was seen by [name], NP via telemedicine on 03/05/2019 .  She was to follow-up with Dr Bary Castilla regarding the axillary lymph node.  Bilateral mammogram is scheduled for 05/14/2019.   Labs on 05/08/2019 included hematocrit 33.9, hemoglobin 10.5, MCV 80.3, platelets 474,000, WBC 3,900 (ANC 1,900). Ferritin was 5.   During the interim, she felt "ok". She has been feeling tired for the past 2 days. She can tell that her iron is low. She notes that she went off her menstrual cycle yesterday. Her menstrual was heavy. She is considering a hysterectomy in the summer of 2021. She denies ice pica, shortness or breath and restless legs.   She has a mammogram scheduled for 05/14/2019. An ultrasound will be added.  Patient agreed to another biopsy.     Past Medical History:  Diagnosis Date  . Allergy   . Anemia   . Asthma   . Diabetes mellitus without complication ([place])   . Hypertension   . Sleep apnea     Past Surgical History:  Procedure Laterality Date  . AXILLARY LYMPH NODE BIOPSY Right 05/25/2017   LYMPHADENITIS. NEGATIVE FOR MALIGNANCY.   Marland Kitchen BREAST BIOPSY Right 05/25/2017   neg/NEGATIVE FOR  CARCINOMA. INFLAMMATION AND SQUAMOUS METAPLASIA OF A FOCAL DUCT   . CESAREAN SECTION     times 3  . TUBAL LIGATION      Family History  Problem Relation Age of Onset  . Diabetes Mother   . Heart failure Mother   . Kidney disease Mother   . Diabetes Maternal Grandmother   . Breast cancer Maternal Grandmother 87  . Hypertension Father   . Hyperlipidemia Father   . Atrial fibrillation Sister   . ADD / ADHD Son   . Heart attack Maternal Grandfather   . Diabetes Paternal Grandmother   . Hypertension Paternal Grandmother   . Prostate cancer Paternal Grandfather   . Polycystic ovary syndrome Sister   . Polycystic ovary syndrome Sister   . Ovarian cancer Neg Hx   . Colon cancer Neg Hx     Social History:  reports that she has never smoked. She has never used smokeless tobacco. She reports that she does not drink alcohol or use drugs. [place] She is now divorced.She does not smoke or drink. She denies exposure to radiation or toxins. She is an [facility]. The patient is alone today.  Allergies:  Allergies  Allergen Reactions  . Lisinopril     Current Medications: Current Outpatient Medications  Medication Sig Dispense Refill  . amLODipine (NORVASC) 5 MG tablet Take 1 tablet (5 mg total) by mouth daily. 90 tablet 3  .  azelastine (ASTELIN) 0.1 % nasal spray Place 1 spray into both nostrils 2 (two) times daily. Use in each nostril as directed 30 mL 1  . blood glucose meter kit and supplies KIT Dispense based on patient and insurance preference. Use up to four times daily as directed. (FOR ICD-9 250.00, 250.01). 1 each 0  . cetirizine (ZYRTEC) 10 MG tablet Take 1 tablet (10 mg total) by mouth daily. 30 tablet 11  . cholecalciferol (VITAMIN D3) 25 MCG (1000 UT) tablet Take 1,000 Units by mouth daily.    . clotrimazole-betamethasone (LOTRISONE) cream Apply 1 application  topically 2 (two) times daily. 30 g 0  . famotidine (PEPCID) 20 MG tablet Take 1 tablet (20 mg total) by mouth 2 (two) times daily. 30 tablet 0  . Fluticasone-Salmeterol (ADVAIR DISKUS) 100-50 MCG/DOSE AEPB Inhale 1 puff into the lungs 2 (two) times daily. 60 each 3  . hydrOXYzine (ATARAX/VISTARIL) 10 MG tablet Take 1 tablet (10 mg total) by mouth 3 (three) times daily as needed for anxiety. 30 tablet 0  . Insulin Pen Needle (NOVOFINE) 32G X 6 MM MISC 1 each by Does not apply route once a week. 50 each 1  . montelukast (SINGULAIR) 10 MG tablet Take 1 tablet (10 mg total) by mouth at bedtime. 30 tablet 3  . Pitavastatin Calcium 2 MG TABS Take 1 tablet (2 mg total) by mouth daily. 90 tablet 3  . Semaglutide, 1 MG/DOSE, (OZEMPIC, 1 MG/DOSE,) 2 MG/1.5ML SOPN Inject 1 mg into the skin once a week. 3 pen 3  . albuterol (PROVENTIL) (2.5 MG/3ML) 0.083% nebulizer solution Take 2.5 mg by nebulization every 6 (six) hours as needed for wheezing or shortness of breath.    Marland Kitchen albuterol (VENTOLIN HFA) 108 (90 Base) MCG/ACT inhaler Inhale 2 puffs into the lungs every 6 (six) hours as needed for wheezing or shortness of breath. (Patient not taking: Reported on 05/07/2019) 8 g 0  . predniSONE (DELTASONE) 10 MG tablet Day1:5tabs, Day2:4tabs, Day3:3tabs, Day4:2tabs, Day5:1tab (Patient not taking: Reported on 05/07/2019) 15 tablet 0   No current facility-administered medications for this visit.    Review of Systems  Constitutional: Positive for malaise/fatigue and weight loss (11 pounds since 11/06/2018). Negative for chills, diaphoresis and fever.       Feels "ok".  HENT: Negative.  Negative for congestion, hearing loss, nosebleeds, sinus pain and sore throat.   Eyes: Negative.  Negative for blurred vision and double vision.  Respiratory: Negative.  Negative for cough, shortness of breath and wheezing.   Cardiovascular: Negative.  Negative for chest pain, palpitations, orthopnea, leg swelling and PND.   Gastrointestinal: Negative.  Negative for abdominal pain, blood in stool, constipation, diarrhea, heartburn, melena, nausea and vomiting.       No ice pica.  Genitourinary: Negative for dysuria, frequency, hematuria and urgency.       Heavy menses.  Musculoskeletal: Negative.  Negative for back pain, joint pain and myalgias.  Skin: Negative.  Negative for itching and rash.  Neurological: Negative.  Negative for dizziness, sensory change, focal weakness, weakness and headaches.  Endo/Heme/Allergies: Negative.  Does not bruise/bleed easily.  Psychiatric/Behavioral: Negative.  Negative for depression and memory loss. The patient is not nervous/anxious and does not have insomnia.   All other systems reviewed and are negative.  Performance status (ECOG): 0  Vitals Blood pressure 121/73, pulse 84, temperature (!) 97.1 F (36.2 C), temperature source Tympanic, resp. rate 18, height '5\' 2"'  (1.575 m), weight 202 lb 4.4 oz (91.7 kg), SpO2  100 %.   Physical Exam  Constitutional: She is oriented to person, place, and time. She appears well-developed and well-nourished. No distress.  HENT:  Head: Normocephalic and atraumatic.  Mouth/Throat: Oropharynx is clear and moist. No oropharyngeal exudate.  Short black and red hair. Mask.   Eyes: Pupils are equal, round, and reactive to light. Conjunctivae and EOM are normal.  Contacts.  Neck: No JVD present.  Cardiovascular: Normal rate, regular rhythm and normal heart sounds. Exam reveals no gallop and no friction rub.  No murmur heard. Pulmonary/Chest: Effort normal and breath sounds normal. No respiratory distress. She has no wheezes. She has no rales.  Abdominal: Soft. Bowel sounds are normal. She exhibits no distension. There is no abdominal tenderness. There is no rebound and no guarding.  Musculoskeletal:        General: No tenderness or edema. Normal range of motion.     Cervical back: Normal range of motion and neck supple.  Lymphadenopathy:        Head (right side): No preauricular, no posterior auricular and no occipital adenopathy present.       Head (left side): No preauricular, no posterior auricular and no occipital adenopathy present.    She has no cervical adenopathy.       Right: No inguinal and no supraclavicular adenopathy present.       Left: No inguinal and no supraclavicular adenopathy present.  prominent 1.5 cm RIGHT axillary cyst-like, mobile, tender nodule s/p biopsy.  Neurological: She is alert and oriented to person, place, and time.  Skin: Skin is warm and dry. No rash noted. She is not diaphoretic. No erythema. No pallor.  Psychiatric: She has a normal mood and affect. Her behavior is normal. Judgment and thought content normal.  Nursing note and vitals reviewed.   Clinical Support on 05/08/2019  Component Date Value Ref Range Status  . Ferritin 05/08/2019 5* 11 - 307 ng/mL Final   Performed at [facility], [address] [place], [place] [address]  . WBC 05/08/2019 3.9* 4.0 - 10.5 K/uL Final  . RBC 05/08/2019 4.22  3.87 - 5.11 MIL/uL Final  . Hemoglobin 05/08/2019 10.5* 12.0 - 15.0 g/dL Final  . HCT 05/08/2019 33.9* 36.0 - 46.0 % Final  . MCV 05/08/2019 80.3  80.0 - 100.0 fL Final  . MCH 05/08/2019 24.9* 26.0 - 34.0 pg Final  . MCHC 05/08/2019 31.0  30.0 - 36.0 g/dL Final  . RDW 05/08/2019 13.9  11.5 - 15.5 % Final  . Platelets 05/08/2019 474* 150 - 400 K/uL Final  . nRBC 05/08/2019 0.0  0.0 - 0.2 % Final  . Neutrophils Relative % 05/08/2019 46  % Final  . Neutro Abs 05/08/2019 1.9  1.7 - 7.7 K/uL Final  . Lymphocytes Relative 05/08/2019 40  % Final  . Lymphs Abs 05/08/2019 1.6  0.7 - 4.0 K/uL Final  . Monocytes Relative 05/08/2019 9  % Final  . Monocytes Absolute 05/08/2019 0.3  0.1 - 1.0 K/uL Final  . Eosinophils Relative 05/08/2019 3  % Final  . Eosinophils Absolute 05/08/2019 0.1  0.0 - 0.5 K/uL Final  . Basophils Relative 05/08/2019 1  % Final  . Basophils Absolute 05/08/2019 0.0  0.0  - 0.1 K/uL Final  . Immature Granulocytes 05/08/2019 1  % Final  . Abs Immature Granulocytes 05/08/2019 0.02  0.00 - 0.07 K/uL Final   Performed at [facility], [address] [place], [place] [address]  . Coagulation Factor VIII 05/08/2019 225*  56 - 140 % Final   Comment: (NOTE) FVIII activity can increase in a variety of clinical situations including normal pregnancy, in samples drawn from patients (particularly children) who are visibly stressed at the time of phlebotomy, as acute phase reactants, or in response to certain drug therapies such as DDAVP.  Persistently elevated FVIII activity is a risk factor for venous thrombosis as well as recurrence of venous thrombosis.  Risk is graded and increases with the degree of elevation.  Although elevated FVIII activity has been identified to cluster within families, a genetic basis for the elevation has not yet been elucidated (Br J Haematol. 2012; 488:891-694).   . Ristocetin Co-factor, Plasma 05/08/2019 79  50 - 200 % Final   Comment: (NOTE) Performed At: [facility] [place], [place] [id number] Rush Farmer MD [id number]   . Von Willebrand Antigen, Plasma 05/08/2019 130  50 - 200 % Final   Comment: (NOTE) This test was developed and its performance characteristics determined by [facility]. It has not been cleared or approved by the Food and Drug Administration.   . Interpretation 05/08/2019 Note   Final   Comment: (NOTE) Medical Director's Note: Patient Last Name has been corrected on 05/09/2019, was MITCHELL BALDWIN and now is [place]. Please review this report in its entirety, since changes to patient demographics may affect result interpretation(s) and/or treatment/follow-up suggestions. ------------------------------- COAGULATION: VON WILLEBRAND FACTOR ASSESSMENT CURRENT RESULTS ASSESSMENT The VWF:Ag is normal. The VWF:RCo is normal. The FVIII is elevated. VON WILLEBRAND  FACTOR ASSESSMENT CURRENT RESULTS INTERPRETATION - These results are not consistent with a diagnosis of VWD according to the current NHLBI guideline. Persistently elevated FVIII activity is a risk factor for venous thrombosis as well as recurrence of venous thrombosis. Risk is graded and increases with the degree of elevation. Although elevated FVIII activity has been identified to cluster within families, a genetic basis for the elevation has not yet been elucidated (Br J Haematol. 2012; 157(6                          ):A4370195). VON WILLEBRAND FACTOR ASSESSMENT - Results may be falsely elevated and possibly falsely normal as VWF and FVIII may increase in pregnancy, in samples drawn from patients (particularly children) who are visibly stressed at the time of phlebotomy, as acute phase reactants, or in response to certain drug therapies such as desmopressin. Repeat testing may be necessary before excluding a diagnosis of VWD especially if the clinical suspicion is high for an underlying bleeding disorder. The setting for phlebotomy should be as calm as possible and patients should be encouraged to sit quietly prior to the blood draw. VON WILLEBRAND FACTOR ASSESSMENT CUMULATIVE RESULTS SUMMARY - Interpretation is based on current and most recent VWF Assessment profiles performed at [facility]. Results on two occasions are not consistent with a laboratory diagnosis of VWD. If there is a high clinical suspicion of VWD and other causes of bleeding have been ruled out, consider testing                           again. Particular attention should be paid to conditions of specimen collection to avoid acute phase elevations of VWF. VON WILLEBRAND FACTOR ASSESSMENT DEFINITIONS - VWD - von Willebrand disease; VWF - von Willebrand factor; VWF:Ag - VWF antigen; VWF:RCo - VWF ristocetin cofactor activity; FVIII - factor VIII activity. - MEDICAL DIRECTOR: For questions regarding panel  interpretation, please contact Jake Bathe, M.D. at [facility] at [phone]. ------------------------------- DISCLAIMER These assessments and interpretations are provided as a convenience in support of the physician-patient relationship and are not intended to replace the physician's clinical judgment. They are derived from national guidelines in addition to other evidence and expert opinion. The clinician should consider this information within the context of clinical opinion and the individual patient. SEE GUIDANCE FOR VON WILLEBRAND FACTOR ASSESSMENT: (1) The National Hea                          rt, Lung and [place]. The Diagnosis, Evaluation and Management of von Willebrand Disease. Janeal Holmes, MD: [employer] Publication 08-3974. 7341. Available at vSpecials.com.pt. (2) Daryl Eastern et al. Carmin [place]rat J Hematol. 2009; 84(6):366-370. (3) [place]. 2004;10(3):199-217. (4) Pasi KJ et al. Haemophilia. 2004; 10(3):218-231. Performed At: [facility] [place], [place] [id number] Thomasene Ripple MD BD:[id number]     Assessment:  Donna Reyes is a 44 y.o. female withiron deficiency anemiaand reactive thrombocytosis. Etiology is felt secondary to menorrhagia. Dietis fairly good.Sheis intolerant oforal iron.  She has a history of an [place] a young age.[place]. Report is unavailable.She is no longer on a PPI.  Work-up on09/27/2017revealed a hematocrit of 27.4, hemoglobin 8.0, MCV 60.3, platelets391,000, and WBC 4400.Ferritinwas 4 with an iron saturation of 2% and a TIBC of 499. Normal studies included:B12,folate,[place],ANA, and hemoglobin electrophoresis.  Von Willebrand testing was normal on 11/06/2018.  She [place]. She received Gustavo Lah 01/08/2016, 01/15/2016,  11/10/2017, 11/17/2017, and 01/05/2018.  She received Venofer x 3 (11/06/2018 - 11/23/2018).  Ferritinhas been followed: 4 on 01/01/2016, 33 on 02/29/2016, 4 on 10/31/2017, 16 on 01/04/2018, 9 on 06/11/2018,6on 08/13/2018, 9 on 11/05/2018, and 5 on 05/08/2019.   Shehas a history of right breast lump and adenopathyin 2019. Biopsies on 02/21/2019werenegative for malignancy.Pathology revealedlymphadenitis.  Symptomatically, she has been tired for 2 days.  Recent menses was heavy.  Exam reveals a tender right axillary cystic/nodular area.  Plan: 1.   Review labs from 05/08/2019. 2. Iron deficiency anemia Hematocrit 33.9.  Hemoglobin 10.5.  MCV 80.3.               Ferritin 5.    Ferritin goal 100. She continues to have heavy menses. Venofer today and weekly x3 (total 4). 3.   Bleeding diathesis Patient with heavy menses. Von Willebrand testing was negative.   Discuss follow-up with gynecology for menorrhagia.   Hysterectomy is being considered. 4.   Right axillary adenopathy Patient has a 1.5 cm palpable tender right axillary node.  Add right axillary ultrasound to the mammogram scheduled for 05/14/2019.  Prior pathology revealed lymphadenitis.    Patient seen by Dr Bary Castilla. 5.RTC in 3 months for labs (CBC, ferritin). 6.   RTC in 6 months for MD assessment, labs (CBC with diff, ferritin- day before) and +/- Venofer.  I discussed the assessment and treatment plan with the patient.  The patient was provided an opportunity to ask questions and all were answered.  The patient agreed with the plan and demonstrated an understanding of the instructions.  The patient was advised to call back if the symptoms worsen or if the condition fails to improve as anticipated.   Lequita Asal, MD, PhD    05/09/2019, 2:25 PM  I, Selena Batten, am acting as scribe  for Jaleeya Mcnelly C. Mike Gip, MD, PhD.  I, Jameya Pontiff  C. Mike Gip, MD, have reviewed the above documentation for accuracy and completeness, and I agree with the above.

## 2019-05-08 ENCOUNTER — Other Ambulatory Visit: Payer: Self-pay

## 2019-05-08 ENCOUNTER — Ambulatory Visit: Payer: Self-pay

## 2019-05-08 ENCOUNTER — Ambulatory Visit: Payer: Self-pay | Admitting: Hematology and Oncology

## 2019-05-08 ENCOUNTER — Inpatient Hospital Stay: Payer: Managed Care, Other (non HMO) | Attending: Hematology and Oncology

## 2019-05-08 ENCOUNTER — Other Ambulatory Visit: Payer: Self-pay | Admitting: Family Medicine

## 2019-05-08 DIAGNOSIS — Z8249 Family history of ischemic heart disease and other diseases of the circulatory system: Secondary | ICD-10-CM | POA: Insufficient documentation

## 2019-05-08 DIAGNOSIS — D500 Iron deficiency anemia secondary to blood loss (chronic): Secondary | ICD-10-CM | POA: Diagnosis not present

## 2019-05-08 DIAGNOSIS — Z1231 Encounter for screening mammogram for malignant neoplasm of breast: Secondary | ICD-10-CM

## 2019-05-08 DIAGNOSIS — Z833 Family history of diabetes mellitus: Secondary | ICD-10-CM | POA: Insufficient documentation

## 2019-05-08 DIAGNOSIS — N920 Excessive and frequent menstruation with regular cycle: Secondary | ICD-10-CM | POA: Diagnosis present

## 2019-05-08 DIAGNOSIS — Z794 Long term (current) use of insulin: Secondary | ICD-10-CM | POA: Diagnosis not present

## 2019-05-08 DIAGNOSIS — Z79899 Other long term (current) drug therapy: Secondary | ICD-10-CM | POA: Insufficient documentation

## 2019-05-08 DIAGNOSIS — E119 Type 2 diabetes mellitus without complications: Secondary | ICD-10-CM | POA: Insufficient documentation

## 2019-05-08 DIAGNOSIS — R591 Generalized enlarged lymph nodes: Secondary | ICD-10-CM | POA: Insufficient documentation

## 2019-05-08 DIAGNOSIS — Z8042 Family history of malignant neoplasm of prostate: Secondary | ICD-10-CM | POA: Insufficient documentation

## 2019-05-08 DIAGNOSIS — I10 Essential (primary) hypertension: Secondary | ICD-10-CM | POA: Insufficient documentation

## 2019-05-08 DIAGNOSIS — Z803 Family history of malignant neoplasm of breast: Secondary | ICD-10-CM | POA: Diagnosis not present

## 2019-05-08 LAB — CBC WITH DIFFERENTIAL/PLATELET
Abs Immature Granulocytes: 0.02 10*3/uL (ref 0.00–0.07)
Basophils Absolute: 0 10*3/uL (ref 0.0–0.1)
Basophils Relative: 1 %
Eosinophils Absolute: 0.1 10*3/uL (ref 0.0–0.5)
Eosinophils Relative: 3 %
HCT: 33.9 % — ABNORMAL LOW (ref 36.0–46.0)
Hemoglobin: 10.5 g/dL — ABNORMAL LOW (ref 12.0–15.0)
Immature Granulocytes: 1 %
Lymphocytes Relative: 40 %
Lymphs Abs: 1.6 10*3/uL (ref 0.7–4.0)
MCH: 24.9 pg — ABNORMAL LOW (ref 26.0–34.0)
MCHC: 31 g/dL (ref 30.0–36.0)
MCV: 80.3 fL (ref 80.0–100.0)
Monocytes Absolute: 0.3 10*3/uL (ref 0.1–1.0)
Monocytes Relative: 9 %
Neutro Abs: 1.9 10*3/uL (ref 1.7–7.7)
Neutrophils Relative %: 46 %
Platelets: 474 10*3/uL — ABNORMAL HIGH (ref 150–400)
RBC: 4.22 MIL/uL (ref 3.87–5.11)
RDW: 13.9 % (ref 11.5–15.5)
WBC: 3.9 10*3/uL — ABNORMAL LOW (ref 4.0–10.5)
nRBC: 0 % (ref 0.0–0.2)

## 2019-05-08 LAB — FERRITIN: Ferritin: 5 ng/mL — ABNORMAL LOW (ref 11–307)

## 2019-05-09 ENCOUNTER — Inpatient Hospital Stay: Payer: Managed Care, Other (non HMO)

## 2019-05-09 ENCOUNTER — Telehealth: Payer: Self-pay | Admitting: *Deleted

## 2019-05-09 ENCOUNTER — Encounter: Payer: Self-pay | Admitting: Hematology and Oncology

## 2019-05-09 ENCOUNTER — Inpatient Hospital Stay (HOSPITAL_BASED_OUTPATIENT_CLINIC_OR_DEPARTMENT_OTHER): Payer: Managed Care, Other (non HMO) | Admitting: Hematology and Oncology

## 2019-05-09 VITALS — BP 122/77 | HR 80

## 2019-05-09 VITALS — BP 121/73 | HR 84 | Temp 97.1°F | Resp 18 | Ht 62.0 in | Wt 202.3 lb

## 2019-05-09 DIAGNOSIS — R590 Localized enlarged lymph nodes: Secondary | ICD-10-CM

## 2019-05-09 DIAGNOSIS — D508 Other iron deficiency anemias: Secondary | ICD-10-CM

## 2019-05-09 DIAGNOSIS — N920 Excessive and frequent menstruation with regular cycle: Secondary | ICD-10-CM

## 2019-05-09 DIAGNOSIS — D500 Iron deficiency anemia secondary to blood loss (chronic): Secondary | ICD-10-CM

## 2019-05-09 LAB — VON WILLEBRAND PANEL
Coagulation Factor VIII: 225 % — ABNORMAL HIGH (ref 56–140)
Ristocetin Co-factor, Plasma: 79 % (ref 50–200)
Von Willebrand Antigen, Plasma: 130 % (ref 50–200)

## 2019-05-09 LAB — COAG STUDIES INTERP REPORT

## 2019-05-09 MED ORDER — SODIUM CHLORIDE 0.9 % IV SOLN
Freq: Once | INTRAVENOUS | Status: AC
  Filled 2019-05-09: qty 250

## 2019-05-09 MED ORDER — IRON SUCROSE 20 MG/ML IV SOLN
200.0000 mg | Freq: Once | INTRAVENOUS | Status: AC
  Administered 2019-05-09: 200 mg via INTRAVENOUS

## 2019-05-09 MED ORDER — SODIUM CHLORIDE 0.9 % IV SOLN: 200.0000 mg | Freq: Once | INTRAVENOUS | Status: DC

## 2019-05-09 NOTE — Telephone Encounter (Signed)
Spoke with Primary Care office about patient needing the mammogram switched to a diagnostic mammogram so that the Ultrasound that was ordered by Dr.Corcoran can be done with the mammogram and caller took the message and stated she would send it to the provider and someone would reach back out to the office.Marland KitchenMarland KitchenMarland Kitchen

## 2019-05-09 NOTE — Patient Instructions (Signed)

## 2019-05-14 ENCOUNTER — Ambulatory Visit: Payer: Managed Care, Other (non HMO)

## 2019-05-16 ENCOUNTER — Other Ambulatory Visit: Payer: Self-pay

## 2019-05-16 ENCOUNTER — Ambulatory Visit: Payer: Managed Care, Other (non HMO)

## 2019-05-17 ENCOUNTER — Inpatient Hospital Stay: Payer: Managed Care, Other (non HMO)

## 2019-05-17 VITALS — BP 120/81 | HR 75 | Temp 96.6°F | Resp 17

## 2019-05-17 DIAGNOSIS — D500 Iron deficiency anemia secondary to blood loss (chronic): Secondary | ICD-10-CM | POA: Diagnosis not present

## 2019-05-17 DIAGNOSIS — D508 Other iron deficiency anemias: Secondary | ICD-10-CM

## 2019-05-17 MED ORDER — SODIUM CHLORIDE 0.9 % IV SOLN
Freq: Once | INTRAVENOUS | Status: AC
  Filled 2019-05-17: qty 250

## 2019-05-17 MED ORDER — SODIUM CHLORIDE 0.9 % IV SOLN: 200.0000 mg | Freq: Once | INTRAVENOUS | Status: DC

## 2019-05-17 MED ORDER — IRON SUCROSE 20 MG/ML IV SOLN
200.0000 mg | Freq: Once | INTRAVENOUS | Status: AC
  Administered 2019-05-17: 200 mg via INTRAVENOUS

## 2019-05-17 NOTE — Patient Instructions (Signed)

## 2019-05-20 ENCOUNTER — Encounter: Payer: Self-pay | Admitting: Hematology and Oncology

## 2019-05-20 ENCOUNTER — Other Ambulatory Visit: Payer: Self-pay | Admitting: Hematology and Oncology

## 2019-05-20 DIAGNOSIS — R590 Localized enlarged lymph nodes: Secondary | ICD-10-CM

## 2019-05-21 ENCOUNTER — Telehealth: Payer: Self-pay

## 2019-05-21 NOTE — Telephone Encounter (Signed)
Spoke with the patient which she has informed me that. The lymph node under her right arm is very swollen and painful. She have noticed it more since my last infusion. The patient reports she had to take pain medication several times yesterday and use an ice pack. She did receive the infusion on that side twice. The patient reports she has scheduled her appointment for her  mammogram and ultrasound for Wednesday morning. The swelling concerns her because it is spreading and is very tender to the touch. Should we postpone the next infusion ? I have informed the patient that we can get her seen in our [facility] she was agreeable but wanted to wait until tomorrow. I have informed Donna Reyes to get the patient schedule.. Donna Reyes was able to get the patient schedule at 8:30 in the morning 05/22/2019. The patient was understanding and agreeable.

## 2019-05-22 ENCOUNTER — Inpatient Hospital Stay (HOSPITAL_BASED_OUTPATIENT_CLINIC_OR_DEPARTMENT_OTHER): Payer: Managed Care, Other (non HMO) | Admitting: Oncology

## 2019-05-22 ENCOUNTER — Other Ambulatory Visit: Payer: Self-pay | Admitting: Oncology

## 2019-05-22 ENCOUNTER — Other Ambulatory Visit: Payer: Self-pay

## 2019-05-22 ENCOUNTER — Encounter: Payer: Self-pay | Admitting: Oncology

## 2019-05-22 ENCOUNTER — Inpatient Hospital Stay: Payer: Managed Care, Other (non HMO)

## 2019-05-22 ENCOUNTER — Ambulatory Visit
Admission: RE | Admit: 2019-05-22 | Discharge: 2019-05-22 | Disposition: A | Discharge status: Home / Self Care | Payer: Managed Care, Other (non HMO) | Source: Ambulatory Visit | Attending: Hematology and Oncology | Admitting: Hematology and Oncology

## 2019-05-22 DIAGNOSIS — D509 Iron deficiency anemia, unspecified: Secondary | ICD-10-CM | POA: Insufficient documentation

## 2019-05-22 DIAGNOSIS — R590 Localized enlarged lymph nodes: Secondary | ICD-10-CM

## 2019-05-22 DIAGNOSIS — Z79899 Other long term (current) drug therapy: Secondary | ICD-10-CM | POA: Insufficient documentation

## 2019-05-22 DIAGNOSIS — I10 Essential (primary) hypertension: Secondary | ICD-10-CM | POA: Insufficient documentation

## 2019-05-22 DIAGNOSIS — E119 Type 2 diabetes mellitus without complications: Secondary | ICD-10-CM | POA: Diagnosis not present

## 2019-05-22 LAB — CBC WITH DIFFERENTIAL/PLATELET
Abs Immature Granulocytes: 0.02 10*3/uL (ref 0.00–0.07)
Basophils Absolute: 0 10*3/uL (ref 0.0–0.1)
Basophils Relative: 1 %
Eosinophils Absolute: 0.2 10*3/uL (ref 0.0–0.5)
Eosinophils Relative: 4 %
HCT: 38.1 % (ref 36.0–46.0)
Hemoglobin: 11.5 g/dL — ABNORMAL LOW (ref 12.0–15.0)
Immature Granulocytes: 0 %
Lymphocytes Relative: 30 %
Lymphs Abs: 1.4 10*3/uL (ref 0.7–4.0)
MCH: 25.1 pg — ABNORMAL LOW (ref 26.0–34.0)
MCHC: 30.2 g/dL (ref 30.0–36.0)
MCV: 83.2 fL (ref 80.0–100.0)
Monocytes Absolute: 0.5 10*3/uL (ref 0.1–1.0)
Monocytes Relative: 11 %
Neutro Abs: 2.6 10*3/uL (ref 1.7–7.7)
Neutrophils Relative %: 54 %
Platelets: 425 10*3/uL — ABNORMAL HIGH (ref 150–400)
RBC: 4.58 MIL/uL (ref 3.87–5.11)
RDW: 15.5 % (ref 11.5–15.5)
WBC: 4.8 10*3/uL (ref 4.0–10.5)
nRBC: 0 % (ref 0.0–0.2)

## 2019-05-22 LAB — COMPREHENSIVE METABOLIC PANEL
ALT: 17 U/L (ref 0–44)
AST: 16 U/L (ref 15–41)
Albumin: 4.1 g/dL (ref 3.5–5.0)
Alkaline Phosphatase: 53 U/L (ref 38–126)
Anion gap: 8 (ref 5–15)
BUN: 12 mg/dL (ref 6–20)
CO2: 26 mmol/L (ref 22–32)
Calcium: 9 mg/dL (ref 8.9–10.3)
Chloride: 103 mmol/L (ref 98–111)
Creatinine, Ser: 0.6 mg/dL (ref 0.44–1.00)
GFR calc Af Amer: >60 mL/min (ref >60)
GFR calc non Af Amer: >60 mL/min (ref >60)
Glucose, Bld: 94 mg/dL (ref 70–99)
Potassium: 4.1 mmol/L (ref 3.5–5.1)
Sodium: 137 mmol/L (ref 135–145)
Total Bilirubin: 0.4 mg/dL (ref 0.3–1.2)
Total Protein: 7.6 g/dL (ref 6.5–8.1)

## 2019-05-22 LAB — LACTATE DEHYDROGENASE: LDH: 134 U/L (ref 98–192)
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
## 2019-05-23 ENCOUNTER — Inpatient Hospital Stay: Payer: Managed Care, Other (non HMO)

## 2019-05-30 ENCOUNTER — Ambulatory Visit: Payer: Managed Care, Other (non HMO)

## 2019-05-31 ENCOUNTER — Other Ambulatory Visit: Payer: Self-pay

## 2019-05-31 ENCOUNTER — Inpatient Hospital Stay: Payer: Managed Care, Other (non HMO)

## 2019-05-31 VITALS — BP 121/79 | HR 78 | Temp 97.4°F | Resp 18

## 2019-05-31 DIAGNOSIS — D508 Other iron deficiency anemias: Secondary | ICD-10-CM

## 2019-05-31 DIAGNOSIS — D500 Iron deficiency anemia secondary to blood loss (chronic): Secondary | ICD-10-CM | POA: Diagnosis not present

## 2019-05-31 MED ORDER — IRON SUCROSE 20 MG/ML IV SOLN
200.0000 mg | Freq: Once | INTRAVENOUS | Status: AC
  Administered 2019-05-31: 200 mg via INTRAVENOUS

## 2019-05-31 MED ORDER — SODIUM CHLORIDE 0.9 % IV SOLN
Freq: Once | INTRAVENOUS | Status: AC
  Filled 2019-05-31: qty 250

## 2019-05-31 MED ORDER — SODIUM CHLORIDE 0.9 % IV SOLN: 200.0000 mg | Freq: Once | INTRAVENOUS | Status: DC

## 2019-05-31 NOTE — Patient Instructions (Signed)

## 2019-05-31 NOTE — Progress Notes (Signed)
Patient having cramping in L forearm. Tried slowing iron rate down without improvement in burning/cramping. Positive blood return in IV catheter but removed needle to restick d/t discomfort. IV restarted in R arm. No complaints of discomfort.

## 2019-06-06 ENCOUNTER — Inpatient Hospital Stay: Payer: Managed Care, Other (non HMO) | Attending: Hematology and Oncology

## 2019-06-06 ENCOUNTER — Other Ambulatory Visit: Payer: Self-pay

## 2019-06-06 VITALS — BP 130/85 | HR 79 | Temp 95.1°F | Resp 18

## 2019-06-06 DIAGNOSIS — D508 Other iron deficiency anemias: Secondary | ICD-10-CM | POA: Diagnosis not present

## 2019-06-06 MED ORDER — SODIUM CHLORIDE 0.9 % IV SOLN
Freq: Once | INTRAVENOUS | Status: AC
  Filled 2019-06-06: qty 250

## 2019-06-06 MED ORDER — SODIUM CHLORIDE 0.9 % IV SOLN: 200.0000 mg | Freq: Once | INTRAVENOUS | Status: DC

## 2019-06-06 MED ORDER — IRON SUCROSE 20 MG/ML IV SOLN
200.0000 mg | Freq: Once | INTRAVENOUS | Status: AC
  Administered 2019-06-06: 200 mg via INTRAVENOUS

## 2019-06-06 NOTE — Patient Instructions (Signed)

## 2019-06-07 ENCOUNTER — Ambulatory Visit: Payer: Managed Care, Other (non HMO)

## 2019-06-20 ENCOUNTER — Encounter: Payer: Self-pay | Admitting: Family Medicine

## 2019-06-20 ENCOUNTER — Other Ambulatory Visit: Payer: Self-pay

## 2019-06-20 ENCOUNTER — Ambulatory Visit (INDEPENDENT_AMBULATORY_CARE_PROVIDER_SITE_OTHER): Payer: Managed Care, Other (non HMO) | Admitting: Family Medicine

## 2019-06-20 ENCOUNTER — Telehealth: Payer: Managed Care, Other (non HMO)

## 2019-06-20 VITALS — BP 122/82 | HR 99 | Temp 98.1°F | Resp 14 | Ht 63.0 in | Wt 200.3 lb

## 2019-06-20 DIAGNOSIS — N644 Mastodynia: Secondary | ICD-10-CM | POA: Diagnosis not present

## 2019-06-20 MED ORDER — TRAMADOL HCL 50 MG PO TABS: 25.0000 mg | ORAL_TABLET | Freq: Two times a day (BID) | ORAL | 0 refills | Status: AC | PRN

## 2019-06-20 MED ORDER — NAPROXEN 500 MG PO TABS: 500.0000 mg | ORAL_TABLET | Freq: Two times a day (BID) | ORAL | 0 refills | Status: DC
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
## 2019-06-20 NOTE — Patient Instructions (Signed)
Continue to use NSAIDs (ibuprofen or try the naproxen) and tylenol for pain Can use tramadol for severe pain  Suggest following up with GYN or Dr. Fleet Contras if still having severe pain next week.

## 2019-06-21 ENCOUNTER — Ambulatory Visit: Payer: Medicaid Other | Admitting: Family Medicine

## 2019-06-21 ENCOUNTER — Encounter: Payer: Self-pay | Admitting: Family Medicine

## 2019-07-04 ENCOUNTER — Ambulatory Visit: Payer: Medicaid Other | Admitting: Family Medicine

## 2019-07-12 ENCOUNTER — Ambulatory Visit: Payer: Medicaid Other | Admitting: Family Medicine

## 2019-07-19 ENCOUNTER — Telehealth: Payer: Managed Care, Other (non HMO)

## 2019-07-19 ENCOUNTER — Encounter: Payer: Self-pay | Admitting: Family Medicine

## 2019-07-19 ENCOUNTER — Telehealth: Payer: Managed Care, Other (non HMO) | Admitting: Emergency Medicine

## 2019-07-19 DIAGNOSIS — A5901 Trichomonal vulvovaginitis: Secondary | ICD-10-CM

## 2019-07-19 NOTE — Progress Notes (Signed)
Based on what you shared with me, I feel your condition warrants further evaluation and I recommend that you be seen for a face to face visit.    I'm sorry, treating sexually transmitted infections is out of the "scope of practice" for e-visits.  Per our company policy I am obligated to refer you for an in-person visit.  Please contact your primary care physician practice to be seen. Many offices offer virtual options to be seen via video if you are not comfortable going in person to a medical facility at this time.  If you do not have a PCP, [facility] offers a free physician referral service available at [phone]. Our trained staff has the experience, knowledge and resources to put you in touch with a physician who is right for you.   You also have the option of a video visit through [email or web address]  If you are having a true medical emergency please call 911.  NOTE: If you entered your credit card information for this eVisit, you will not be charged. You may see a "hold" on your card for the $35 but that hold will drop off and you will not have a charge processed.  Your e-visit answers were reviewed by a board certified advanced clinical practitioner to complete your personal care plan.  Thank you for using e-Visits.  Approximately 5 minutes was used in reviewing the patient's chart, questionnaire, prescribing medications, and documentation.

## 2019-07-23 ENCOUNTER — Ambulatory Visit (INDEPENDENT_AMBULATORY_CARE_PROVIDER_SITE_OTHER): Payer: Managed Care, Other (non HMO) | Admitting: Obstetrics and Gynecology

## 2019-07-23 ENCOUNTER — Other Ambulatory Visit (HOSPITAL_COMMUNITY)
Admission: RE | Admit: 2019-07-23 | Discharge: 2019-07-23 | Disposition: A | Discharge status: Home / Self Care | Payer: Managed Care, Other (non HMO) | Source: Ambulatory Visit | Attending: Obstetrics and Gynecology | Admitting: Obstetrics and Gynecology

## 2019-07-23 ENCOUNTER — Other Ambulatory Visit: Payer: Self-pay

## 2019-07-23 ENCOUNTER — Encounter: Payer: Self-pay | Admitting: Obstetrics and Gynecology

## 2019-07-23 ENCOUNTER — Encounter: Payer: Managed Care, Other (non HMO) | Admitting: Obstetrics and Gynecology

## 2019-07-23 VITALS — BP 104/80 | Ht 62.0 in | Wt 203.0 lb

## 2019-07-23 DIAGNOSIS — N760 Acute vaginitis: Secondary | ICD-10-CM

## 2019-07-23 DIAGNOSIS — B9689 Other specified bacterial agents as the cause of diseases classified elsewhere: Secondary | ICD-10-CM

## 2019-07-23 DIAGNOSIS — Z113 Encounter for screening for infections with a predominantly sexual mode of transmission: Secondary | ICD-10-CM | POA: Diagnosis not present

## 2019-07-23 LAB — POCT WET PREP WITH KOH
Clue Cells Wet Prep HPF POC: POSITIVE
KOH Prep POC: POSITIVE — AB
Trichomonas, UA: NEGATIVE
Yeast Wet Prep HPF POC: NEGATIVE

## 2019-07-23 MED ORDER — METRONIDAZOLE 500 MG PO TABS: 500.0000 mg | ORAL_TABLET | Freq: Two times a day (BID) | ORAL | 0 refills | Status: DC

## 2019-07-23 NOTE — Patient Instructions (Signed)
I value your feedback and entrusting us with your care. If you get a [facility] patient survey, I would appreciate you taking the time to let us know about your experience today. Thank you!  As of March 14, 2019, your lab results will be released to your MyChart immediately, before I even have a chance to see them. Please give me time to review them and contact you if there are any abnormalities. Thank you for your patience.  
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
## 2019-07-24 LAB — RPR: RPR Ser Ql: NONREACTIVE

## 2019-07-24 LAB — HSV 2 ANTIBODY, IGG: HSV 2 IgG, Type Spec: <0.91 index (ref 0.00–0.90)

## 2019-07-24 LAB — CERVICOVAGINAL ANCILLARY ONLY
Chlamydia: NEGATIVE
Comment: NEGATIVE
Comment: NEGATIVE
Comment: NORMAL
Neisseria Gonorrhea: NEGATIVE
Trichomonas: NEGATIVE

## 2019-07-24 LAB — HEPATITIS C ANTIBODY: Hep C Virus Ab: <0.1 s/co ratio (ref 0.0–0.9)

## 2019-07-24 LAB — HIV ANTIBODY (ROUTINE TESTING W REFLEX): HIV Screen 4th Generation wRfx: NONREACTIVE

## 2019-08-06 ENCOUNTER — Inpatient Hospital Stay: Payer: Managed Care, Other (non HMO) | Attending: Hematology and Oncology

## 2019-08-06 ENCOUNTER — Other Ambulatory Visit: Payer: Self-pay

## 2019-08-06 DIAGNOSIS — D509 Iron deficiency anemia, unspecified: Secondary | ICD-10-CM | POA: Insufficient documentation

## 2019-08-06 DIAGNOSIS — D500 Iron deficiency anemia secondary to blood loss (chronic): Secondary | ICD-10-CM

## 2019-08-06 LAB — CBC WITH DIFFERENTIAL/PLATELET
Abs Immature Granulocytes: 0.01 K/uL (ref 0.00–0.07)
Basophils Absolute: 0 K/uL (ref 0.0–0.1)
Basophils Relative: 1 %
Eosinophils Absolute: 0.1 K/uL (ref 0.0–0.5)
Eosinophils Relative: 4 %
HCT: 35.1 % — ABNORMAL LOW (ref 36.0–46.0)
Hemoglobin: 11.2 g/dL — ABNORMAL LOW (ref 12.0–15.0)
Immature Granulocytes: 0 %
Lymphocytes Relative: 31 %
Lymphs Abs: 1.3 K/uL (ref 0.7–4.0)
MCH: 26 pg (ref 26.0–34.0)
MCHC: 31.9 g/dL (ref 30.0–36.0)
MCV: 81.6 fL (ref 80.0–100.0)
Monocytes Absolute: 0.4 K/uL (ref 0.1–1.0)
Monocytes Relative: 10 %
Neutro Abs: 2.2 K/uL (ref 1.7–7.7)
Neutrophils Relative %: 54 %
Platelets: 453 K/uL — ABNORMAL HIGH (ref 150–400)
RBC: 4.3 MIL/uL (ref 3.87–5.11)
RDW: 15.3 % (ref 11.5–15.5)
WBC: 4 K/uL (ref 4.0–10.5)
nRBC: 0 % (ref 0.0–0.2)

## 2019-08-06 LAB — FERRITIN: Ferritin: 11 ng/mL (ref 11–307)

## 2019-08-07 ENCOUNTER — Telehealth: Payer: Self-pay

## 2019-08-07 NOTE — Telephone Encounter (Signed)
-----   Message from Lequita Asal, MD sent at 08/06/2019  8:20 PM EDT ----- Regarding: Please call patient.  Ferritin 11.  Consider weekly Venofer x 3.  ----- Message ----- From: Interface, Lab In [place] Sent: 08/06/2019   9:11 AM EDT To: Lequita Asal, MD

## 2019-08-07 NOTE — Telephone Encounter (Signed)
Spoke with the patient to inform her that her Ferritin was low at 11 and Per Dr Mike Gip the patient will need 3 Venofer treatment x 3 week. The patient was understanding. I have also informed Ms Shirlean Mylar to reach out to the patient and get her schedule.

## 2019-08-08 ENCOUNTER — Encounter: Payer: Self-pay | Admitting: Hematology and Oncology

## 2019-08-09 ENCOUNTER — Telehealth: Payer: Self-pay

## 2019-08-09 ENCOUNTER — Telehealth: Payer: Self-pay | Admitting: Hematology and Oncology

## 2019-08-09 NOTE — Telephone Encounter (Signed)
I have left 2 messages for the patient to return my call to schedule weekly Venofer X 3.

## 2019-08-09 NOTE — Telephone Encounter (Signed)
Spoke with the patient to inform her, Per Dr Mike Gip she can try oral Iron 325 mg 1 tab po daily, repeat labs with next visit. The patient was understanding and agreeable.

## 2019-08-17 ENCOUNTER — Other Ambulatory Visit: Payer: Self-pay | Admitting: Family Medicine

## 2019-08-17 DIAGNOSIS — E11620 Type 2 diabetes mellitus with diabetic dermatitis: Secondary | ICD-10-CM

## 2019-08-19 MED ORDER — BLOOD GLUCOSE MONITOR KIT: PACK | 0 refills | Status: DC

## 2019-08-27 ENCOUNTER — Telehealth: Payer: Self-pay | Admitting: Family Medicine

## 2019-08-27 NOTE — Telephone Encounter (Signed)
Tish with [employer] [employer] [facility] requesting a prior authorization for prescription ozempic She provided a patient key# Rayne and callback number of [phone]

## 2019-08-27 NOTE — Telephone Encounter (Signed)
Routing to [employer]

## 2019-09-03 NOTE — Telephone Encounter (Signed)
No new rx on file

## 2019-09-03 NOTE — Telephone Encounter (Signed)
[employer] called about status of PA for Semaglutide, 1 MG/DOSE, (OZEMPIC, 1 MG/DOSE,) 2 MG/1.5ML SOPN   Key# [email or web address]  CB# [id number]  Pts RX insurance expired in October and they need updated insurance info for Pt/

## 2019-09-13 ENCOUNTER — Encounter: Payer: 59 | Admitting: Obstetrics and Gynecology

## 2019-10-03 DIAGNOSIS — Z1371 Encounter for nonprocreative screening for genetic disease carrier status: Secondary | ICD-10-CM

## 2019-10-03 HISTORY — DX: Encounter for nonprocreative screening for genetic disease carrier status: Z13.71

## 2019-10-10 ENCOUNTER — Ambulatory Visit (INDEPENDENT_AMBULATORY_CARE_PROVIDER_SITE_OTHER): Payer: Managed Care, Other (non HMO) | Admitting: Obstetrics and Gynecology

## 2019-10-10 ENCOUNTER — Telehealth: Payer: Self-pay

## 2019-10-10 ENCOUNTER — Inpatient Hospital Stay: Payer: Managed Care, Other (non HMO) | Attending: Hematology and Oncology

## 2019-10-10 ENCOUNTER — Encounter: Payer: Self-pay | Admitting: Obstetrics and Gynecology

## 2019-10-10 ENCOUNTER — Other Ambulatory Visit: Payer: Self-pay

## 2019-10-10 VITALS — BP 110/70 | Ht 62.0 in | Wt 202.0 lb

## 2019-10-10 DIAGNOSIS — Z30013 Encounter for initial prescription of injectable contraceptive: Secondary | ICD-10-CM | POA: Diagnosis not present

## 2019-10-10 DIAGNOSIS — N920 Excessive and frequent menstruation with regular cycle: Secondary | ICD-10-CM

## 2019-10-10 DIAGNOSIS — Z01419 Encounter for gynecological examination (general) (routine) without abnormal findings: Secondary | ICD-10-CM

## 2019-10-10 DIAGNOSIS — D500 Iron deficiency anemia secondary to blood loss (chronic): Secondary | ICD-10-CM

## 2019-10-10 DIAGNOSIS — Z1211 Encounter for screening for malignant neoplasm of colon: Secondary | ICD-10-CM

## 2019-10-10 DIAGNOSIS — Z1231 Encounter for screening mammogram for malignant neoplasm of breast: Secondary | ICD-10-CM

## 2019-10-10 DIAGNOSIS — N83201 Unspecified ovarian cyst, right side: Secondary | ICD-10-CM | POA: Insufficient documentation

## 2019-10-10 DIAGNOSIS — Z800 Family history of malignant neoplasm of digestive organs: Secondary | ICD-10-CM

## 2019-10-10 DIAGNOSIS — D509 Iron deficiency anemia, unspecified: Secondary | ICD-10-CM | POA: Diagnosis not present

## 2019-10-10 LAB — CBC WITH DIFFERENTIAL/PLATELET
Abs Immature Granulocytes: 0.01 10*3/uL (ref 0.00–0.07)
Basophils Absolute: 0 10*3/uL (ref 0.0–0.1)
Basophils Relative: 0 %
Eosinophils Absolute: 0.2 10*3/uL (ref 0.0–0.5)
Eosinophils Relative: 3 %
HCT: 34.3 % — ABNORMAL LOW (ref 36.0–46.0)
Hemoglobin: 10.9 g/dL — ABNORMAL LOW (ref 12.0–15.0)
Immature Granulocytes: 0 %
Lymphocytes Relative: 34 %
Lymphs Abs: 1.6 10*3/uL (ref 0.7–4.0)
MCH: 24.4 pg — ABNORMAL LOW (ref 26.0–34.0)
MCHC: 31.8 g/dL (ref 30.0–36.0)
MCV: 76.9 fL — ABNORMAL LOW (ref 80.0–100.0)
Monocytes Absolute: 0.5 10*3/uL (ref 0.1–1.0)
Monocytes Relative: 12 %
Neutro Abs: 2.4 10*3/uL (ref 1.7–7.7)
Neutrophils Relative %: 51 %
Platelets: 459 10*3/uL — ABNORMAL HIGH (ref 150–400)
RBC: 4.46 MIL/uL (ref 3.87–5.11)
RDW: 14.1 % (ref 11.5–15.5)
WBC: 4.7 10*3/uL (ref 4.0–10.5)
nRBC: 0 % (ref 0.0–0.2)

## 2019-10-10 LAB — FERRITIN: Ferritin: 6 ng/mL — ABNORMAL LOW (ref 11–307)

## 2019-10-10 MED ORDER — MEDROXYPROGESTERONE ACETATE 150 MG/ML IM SUSY: 150.0000 mg | PREFILLED_SYRINGE | Freq: Once | INTRAMUSCULAR | 3 refills | Status: DC

## 2019-10-10 NOTE — Telephone Encounter (Signed)
-----   Message from Lequita Asal, MD sent at 10/10/2019 12:45 PM EDT ----- Regarding: Please call patient  Ferritin is single digit.  She needs Venofer weekly x 4 with urine pregnancy testing.  M ----- Message ----- From: Buel Ream, Lab In [place] Sent: 10/10/2019   8:32 AM EDT To: Lequita Asal, MD

## 2019-10-10 NOTE — Telephone Encounter (Signed)
Left a message to inform her that per Dr Mike Gip her ferritin is in single digit 6 today, she will need Venofer x 4 weekly.

## 2019-10-10 NOTE — Patient Instructions (Signed)
I value your feedback and entrusting us with your care. If you get a [facility] patient survey, I would appreciate you taking the time to let us know about your experience today. Thank you!  As of March 14, 2019, your lab results will be released to your MyChart immediately, before I even have a chance to see them. Please give me time to review them and contact you if there are any abnormalities. Thank you for your patience.  

## 2019-10-10 NOTE — Progress Notes (Signed)
PCP:  Delsa Grana, PA-C   Chief Complaint  Patient presents with  . Gynecologic Exam     HPI:      [name] is a 44 y.o. [id number] whose LMP was Patient's last menstrual period was 09/30/2019 (exact date)., presents today for her annual examination.  Her menses are regular every 28-30 days, lasting 7 days.  Dysmenorrhea mild. She does not have intermenstrual bleeding. Hx of menorrhagia with regular transfusions now due to anemia. Pt had leio on u/s 2019 (as well as a 5.9 cm simple RTO cyst--u/s f/u not done) and had discussed IUD and hyst with Dr. Amalia Hailey a couple yrs ago. Is interested in tx now for menorrhagia. Hx of HTN. Did depo in past for [facility] and did fine.   Sex activity: single partner, contraception - tubal ligation.  Last Pap: 09/11/17  Results were: no abnormalities /neg HPV DNA  Hx of STDs: HPV on pap in past; s/p colpo with neg bx, no tx  Had BV 4/21, resolved with tx.   Last mammogram: 05/22/19  Results were: Cat 4 due to RT axillary pain and adenopathy, ordered by Dr. Nolon Stalls. Pt saw Dr. Bary Castilla and mammo/LAN stable, no bx needed.   There is a FH of breast cancer in her MGM, genetic testing not indicated. There is no FH of ovarian cancer. There is FH pancreatic cancer in her pat aunt and colon cancer and prostate cancer in her PGF, genetic testing not done. The patient does do self-breast exams.  Tobacco use: The patient denies current or previous tobacco use. Alcohol use: social drinker No drug use.  Exercise: moderately active  Colonoscopy: never  She does get adequate calcium but not Vitamin D in her diet. Labs with PCP.  Past Medical History:  Diagnosis Date  . Allergy   . Anemia   . Asthma   . Diabetes mellitus without complication ([place])   . Hypertension   . Sleep apnea     Past Surgical History:  Procedure Laterality Date  . AXILLARY LYMPH NODE BIOPSY Right 05/25/2017   LYMPHADENITIS. NEGATIVE FOR MALIGNANCY.   Marland Kitchen BREAST BIOPSY  Right 05/25/2017   neg/NEGATIVE FOR CARCINOMA. INFLAMMATION AND SQUAMOUS METAPLASIA OF A FOCAL DUCT   . CESAREAN SECTION     times 3  . TUBAL LIGATION      Family History  Problem Relation Age of Onset  . Diabetes Mother   . Heart failure Mother   . Kidney disease Mother   . Diabetes Maternal Grandmother   . Breast cancer Maternal Grandmother 47  . Hypertension Father   . Hyperlipidemia Father   . Atrial fibrillation Sister   . ADD / ADHD Son   . Heart attack Maternal Grandfather   . Diabetes Paternal Grandmother   . Hypertension Paternal Grandmother   . Prostate cancer Paternal Grandfather   . Colon cancer Paternal Grandfather 27  . Polycystic ovary syndrome Sister   . Polycystic ovary syndrome Sister   . Pancreatic cancer Paternal Aunt 72  . Ovarian cancer Neg Hx     Social History   Socioeconomic History  . Marital status: Married    Spouse name: Not on file  . Number of children: 5  . Years of education: 74  . Highest education level: Associate degree: academic program  Occupational History  . Not on file  Tobacco Use  . Smoking status: Never Smoker  . Smokeless tobacco: Never Used  Vaping Use  . Vaping Use: Never used  Substance and Sexual Activity  . Alcohol use: No  . Drug use: No  . Sexual activity: Yes    Partners: Male    Birth control/protection: Surgical    Comment: tubal ligation   Other Topics Concern  . Not on file  Social History Narrative  . Not on file   Social Determinants of Health   Financial Resource Strain:   . Difficulty of Paying Living Expenses:   Food Insecurity:   . Worried About Charity fundraiser in the Last Year:   . Arboriculturist in the Last Year:   Transportation Needs:   . Film/video editor (Medical):   Marland Kitchen Lack of Transportation (Non-Medical):   Physical Activity: Inactive  . Days of Exercise per Week: 0 days  . Minutes of Exercise per Session: 0 min  Stress:   . Feeling of Stress :   Social Connections:  Unknown  . Frequency of Communication with Friends and Family: More than three times a week  . Frequency of Social Gatherings with Friends and Family: Not on file  . Attends Religious Services: More than 4 times per year  . Active Member of Clubs or Organizations: Not on file  . Attends Archivist Meetings: Not on file  . Marital Status: Not on file  Intimate Partner Violence:   . Fear of Current or Ex-Partner:   . Emotionally Abused:   Marland Kitchen Physically Abused:   . Sexually Abused:      Current Outpatient Medications:  .  albuterol (VENTOLIN HFA) 108 (90 Base) MCG/ACT inhaler, Inhale 2 puffs into the lungs every 6 (six) hours as needed for wheezing or shortness of breath., Disp: 8 g, Rfl: 0 .  amLODipine (NORVASC) 5 MG tablet, Take 1 tablet (5 mg total) by mouth daily., Disp: 90 tablet, Rfl: 3 .  blood glucose meter kit and supplies KIT, Dispense based on patient and insurance preference. Use up to four times daily as directed. (FOR ICD-9 250.00, 250.01)., Disp: 1 each, Rfl: 0 .  cetirizine (ZYRTEC) 10 MG tablet, Take 1 tablet (10 mg total) by mouth daily., Disp: 30 tablet, Rfl: 11 .  Fluticasone-Salmeterol (ADVAIR DISKUS) 100-50 MCG/DOSE AEPB, , Disp: , Rfl:  .  Insulin Pen Needle (NOVOFINE) 32G X 6 MM MISC, 1 each by Does not apply route once a week., Disp: 50 each, Rfl: 1 .  montelukast (SINGULAIR) 10 MG tablet, Take 10 mg by mouth at bedtime., Disp: , Rfl:  .  Semaglutide, 1 MG/DOSE, (OZEMPIC, 1 MG/DOSE,) 2 MG/1.5ML SOPN, Inject 1 mg into the skin once a week., Disp: 3 pen, Rfl: 3 .  medroxyPROGESTERone Acetate 150 MG/ML SUSY, Inject 1 mL (150 mg total) into the muscle once for 1 dose. Q 45month, Disp: 1 mL, Rfl: 3     ROS:  Review of Systems  Constitutional: Negative for fatigue, fever and unexpected weight change.  Respiratory: Negative for cough, shortness of breath and wheezing.   Cardiovascular: Negative for chest pain, palpitations and leg swelling.   Gastrointestinal: Positive for constipation. Negative for blood in stool, diarrhea, nausea and vomiting.  Endocrine: Negative for cold intolerance, heat intolerance and polyuria.  Genitourinary: Positive for menstrual problem. Negative for dyspareunia, dysuria, flank pain, frequency, genital sores, hematuria, pelvic pain, urgency, vaginal bleeding, vaginal discharge and vaginal pain.  Musculoskeletal: Negative for back pain, joint swelling and myalgias.  Skin: Negative for rash.  Neurological: Negative for dizziness, syncope, light-headedness, numbness and headaches.  Hematological: Negative for adenopathy.  Psychiatric/Behavioral: Negative for agitation, confusion, sleep disturbance and suicidal ideas. The patient is not nervous/anxious.   BREAST: No symptoms   Objective: BP 110/70   Ht _0  (1.575 m)   Wt 202 lb (91.6 kg)   LMP 09/30/2019 (Exact Date)   BMI 36.95 kg/m    Physical Exam Constitutional:      Appearance: She is well-developed.  Genitourinary:     Vulva, vagina, cervix, right adnexa and left adnexa normal.     No vulval lesion or tenderness noted.     No vaginal discharge, erythema or tenderness.     No cervical polyp.     Uterus is enlarged and irregular.     Uterus is not tender.     No right or left adnexal mass present.     Right adnexa not tender.     Left adnexa not tender.  Neck:     Thyroid: No thyromegaly.  Cardiovascular:     Rate and Rhythm: Normal rate and regular rhythm.     Heart sounds: Normal heart sounds. No murmur heard.   Pulmonary:     Effort: Pulmonary effort is normal.     Breath sounds: Normal breath sounds.  Chest:     Breasts:        Right: No mass, nipple discharge, skin change or tenderness.        Left: No mass, nipple discharge, skin change or tenderness.  Abdominal:     Palpations: Abdomen is soft.     Tenderness: There is no abdominal tenderness. There is no guarding.  Musculoskeletal:        General: Normal range of  motion.     Cervical back: Normal range of motion.  Neurological:     General: No focal deficit present.     Mental Status: She is alert and oriented to person, place, and time.     Cranial Nerves: No cranial nerve deficit.  Skin:    General: Skin is warm and dry.  Psychiatric:        Mood and Affect: Mood normal.        Behavior: Behavior normal.        Thought Content: Thought content normal.        Judgment: Judgment normal.  Vitals reviewed.     Assessment/Plan: Encounter for annual routine gynecological examination  Menorrhagia with regular cycle - Plan: US PELVIS TRANSVAGINAL NON-OB (TV ONLY); Leio on u/s 2019. Discussed prog only options, IUD, ablation, lysteda, oriahnn, hyst. Pt would like to try depo. Rx eRxd. Can start now. F/u prn. Followed by hematology.   Encounter for initial prescription of injectable contraceptive - Plan: medroxyPROGESTERone Acetate 150 MG/ML SUSY  Encounter for screening mammogram for malignant neoplasm of breast--current on mammo. Followed by PCP  Right ovarian cyst - Plan: US PELVIS TRANSVAGINAL NON-OB (TV ONLY); Check GYN u/s for re-eval. NT on exam. Will call with results.   Family history of pancreatic cancer - Plan: Integrated [facility] ([facility])--MyRisk testing discussed and done today. Will call  with results.   Screening for colon cancer - Plan: Ambulatory referral to Gastroenterology  Meds ordered this encounter  Medications  . medroxyPROGESTERone Acetate 150 MG/ML SUSY    Sig: Inject 1 mL (150 mg total) into the muscle once for 1 dose. Q 33month    Dispense:  1 mL    Refill:  3    Order Specific Question:   Supervising Provider    Answer:   HTeresa Coombs  P [[id number]]             GYN counsel breast self exam, mammography screening, family planning choices, adequate intake of calcium and vitamin D, diet and exercise     F/U  Return in about 1 day (around 10/11/2019) for GYN u/s for RTO cyst f/u--[facility] to call  pt.  Stacye Noori B. Jinelle Butchko, PA-C 10/10/2019 2:36 PM

## 2019-10-11 ENCOUNTER — Other Ambulatory Visit: Payer: Managed Care, Other (non HMO)

## 2019-10-15 ENCOUNTER — Inpatient Hospital Stay (HOSPITAL_BASED_OUTPATIENT_CLINIC_OR_DEPARTMENT_OTHER): Payer: Managed Care, Other (non HMO) | Admitting: Hematology and Oncology

## 2019-10-15 ENCOUNTER — Inpatient Hospital Stay: Payer: Managed Care, Other (non HMO)

## 2019-10-15 VITALS — BP 122/83 | HR 76 | Temp 98.2°F | Resp 18

## 2019-10-15 DIAGNOSIS — D508 Other iron deficiency anemias: Secondary | ICD-10-CM

## 2019-10-15 DIAGNOSIS — D509 Iron deficiency anemia, unspecified: Secondary | ICD-10-CM | POA: Diagnosis not present

## 2019-10-15 LAB — PREGNANCY, URINE: Preg Test, Ur: NEGATIVE

## 2019-10-15 MED ORDER — SODIUM CHLORIDE 0.9 % IV SOLN
Freq: Once | INTRAVENOUS | Status: AC
  Filled 2019-10-15: qty 250

## 2019-10-15 MED ORDER — SODIUM CHLORIDE 0.9 % IV SOLN: 200.0000 mg | Freq: Once | INTRAVENOUS | Status: DC

## 2019-10-15 MED ORDER — IRON SUCROSE 20 MG/ML IV SOLN
200.0000 mg | Freq: Once | INTRAVENOUS | Status: AC
  Administered 2019-10-15: 200 mg via INTRAVENOUS

## 2019-10-15 NOTE — Patient Instructions (Signed)

## 2019-10-17 ENCOUNTER — Other Ambulatory Visit: Payer: Self-pay | Admitting: Obstetrics and Gynecology

## 2019-10-17 ENCOUNTER — Telehealth: Payer: Self-pay

## 2019-10-17 ENCOUNTER — Encounter: Payer: Self-pay | Admitting: Obstetrics and Gynecology

## 2019-10-17 DIAGNOSIS — B9689 Other specified bacterial agents as the cause of diseases classified elsewhere: Secondary | ICD-10-CM

## 2019-10-17 DIAGNOSIS — N760 Acute vaginitis: Secondary | ICD-10-CM

## 2019-10-17 MED ORDER — METRONIDAZOLE 500 MG PO TABS: 500.0000 mg | ORAL_TABLET | Freq: Two times a day (BID) | ORAL | 0 refills | Status: AC

## 2019-10-17 NOTE — Telephone Encounter (Signed)
Debbie RN w/[employer] precert department, the medical director is denying it. She does not meet [employer]'s criteria. They will be sending out denial letters to [facility], [facility] and Patient on how to appeal. If [facility] would like to do a peer to peer, she has 14 calendar days to call [phone]

## 2019-10-17 NOTE — Progress Notes (Signed)
Rx RF flagyl for BV sx.  

## 2019-10-17 NOTE — Telephone Encounter (Signed)
Have reached out to Mikal Plane with [facility].

## 2019-10-18 ENCOUNTER — Ambulatory Visit (INDEPENDENT_AMBULATORY_CARE_PROVIDER_SITE_OTHER): Payer: Managed Care, Other (non HMO)

## 2019-10-18 ENCOUNTER — Other Ambulatory Visit: Payer: Self-pay | Admitting: Obstetrics and Gynecology

## 2019-10-18 ENCOUNTER — Other Ambulatory Visit: Payer: Self-pay

## 2019-10-18 DIAGNOSIS — R1031 Right lower quadrant pain: Secondary | ICD-10-CM

## 2019-10-18 DIAGNOSIS — N920 Excessive and frequent menstruation with regular cycle: Secondary | ICD-10-CM

## 2019-10-18 DIAGNOSIS — N83201 Unspecified ovarian cyst, right side: Secondary | ICD-10-CM

## 2019-10-18 DIAGNOSIS — Z8742 Personal history of other diseases of the female genital tract: Secondary | ICD-10-CM

## 2019-10-21 ENCOUNTER — Telehealth (INDEPENDENT_AMBULATORY_CARE_PROVIDER_SITE_OTHER): Payer: Self-pay | Admitting: Gastroenterology

## 2019-10-21 ENCOUNTER — Other Ambulatory Visit: Payer: Self-pay

## 2019-10-21 DIAGNOSIS — Z1211 Encounter for screening for malignant neoplasm of colon: Secondary | ICD-10-CM

## 2019-10-21 MED ORDER — PEG 3350-KCL-NA BICARB-NACL 420 G PO SOLR
4000.0000 mL | Freq: Once | ORAL | 0 refills | Status: AC
Start: 2019-10-21 — End: 2019-10-21

## 2019-10-21 NOTE — Progress Notes (Signed)
Gastroenterology Pre-Procedure Review  Request Date: Friday 11/29/19 Requesting Physician: Dr. Allen Norris  PATIENT REVIEW QUESTIONS: The patient responded to the following health history questions as indicated:    1. Are you having any GI issues? yes (constipation takes stool softners, and laxatives.  Offered an appt to discuss-declined however if it doesn't get better after colonoscopy she will call back.) 2. Do you have a personal history of Polyps? no 3. Do you have a family history of Colon Cancer or Polyps? yes (grandfather colon cancer) 4. Diabetes Mellitus? Yes takes ozempic 5. Joint replacements in the past 12 months?no 6. Major health problems in the past 3 months?no 7. Any artificial heart valves, MVP, or defibrillator?no    MEDICATIONS & ALLERGIES:    Patient reports the following regarding taking any anticoagulation/antiplatelet therapy:   Plavix, Coumadin, Eliquis, Xarelto, Lovenox, Pradaxa, Brilinta, or Effient? no Aspirin? no  Patient confirms/reports the following medications:  Current Outpatient Medications  Medication Sig Dispense Refill   albuterol (VENTOLIN HFA) 108 (90 Base) MCG/ACT inhaler Inhale 2 puffs into the lungs every 6 (six) hours as needed for wheezing or shortness of breath. 8 g 0   amLODipine (NORVASC) 5 MG tablet Take 1 tablet (5 mg total) by mouth daily. 90 tablet 3   blood glucose meter kit and supplies KIT Dispense based on patient and insurance preference. Use up to four times daily as directed. (FOR ICD-9 250.00, 250.01). 1 each 0   cetirizine (ZYRTEC) 10 MG tablet Take 1 tablet (10 mg total) by mouth daily. 30 tablet 11   Fluticasone-Salmeterol (ADVAIR DISKUS) 100-50 MCG/DOSE AEPB      Insulin Pen Needle (NOVOFINE) 32G X 6 MM MISC 1 each by Does not apply route once a week. 50 each 1   metroNIDAZOLE (FLAGYL) 500 MG tablet Take 1 tablet (500 mg total) by mouth 2 (two) times daily for 7 days. 14 tablet 0   montelukast (SINGULAIR) 10 MG tablet Take  10 mg by mouth at bedtime.     Semaglutide, 1 MG/DOSE, (OZEMPIC, 1 MG/DOSE,) 2 MG/1.5ML SOPN Inject 1 mg into the skin once a week. 3 pen 3   medroxyPROGESTERone Acetate 150 MG/ML SUSY Inject 1 mL (150 mg total) into the muscle once for 1 dose. Q 17month 1 mL 3   polyethylene glycol-electrolytes (NULYTELY) 420 g solution Take 4,000 mLs by mouth once for 1 dose. 4000 mL 0   No current facility-administered medications for this visit.    Patient confirms/reports the following allergies:  Allergies  Allergen Reactions   Lisinopril     Orders Placed This Encounter  Procedures   Procedural/ Surgical Case Request: COLONOSCOPY WITH PROPOFOL    Standing Status:   Standing    Number of Occurrences:   1    Order Specific Question:   Pre-op diagnosis    Answer:   screening colonoscopy    Order Specific Question:   CPT Code    Answer:   458099   AUTHORIZATION INFORMATION Primary Insurance: 1D#: Group #:  Secondary Insurance: 1D#: Group #:  SCHEDULE INFORMATION: Date: Friday 11/29/19 Time: Location:[facility]

## 2019-10-21 NOTE — Telephone Encounter (Signed)
Spoke with Donna Reyes. Per [facility], pt meets testing guidelines and they will do it at no out of pocket expense to pt. Pt aware. Will f/u with results.

## 2019-10-24 ENCOUNTER — Encounter: Payer: Self-pay | Admitting: Obstetrics and Gynecology

## 2019-10-29 IMAGING — US US EXTREM LOW VENOUS*R*
1 series · 13 of 24 positions shown · non-contrast
Comparison: None.

CLINICAL DATA: Right lower extremity pain and swelling.



[Series 1: us extrem low venous*right* · 13 of 36 slices shown]
[im 1/36]
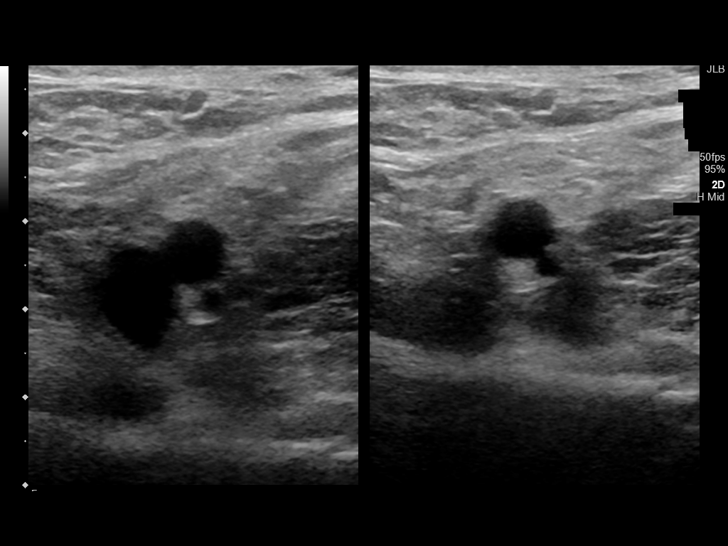
[im 4/36]
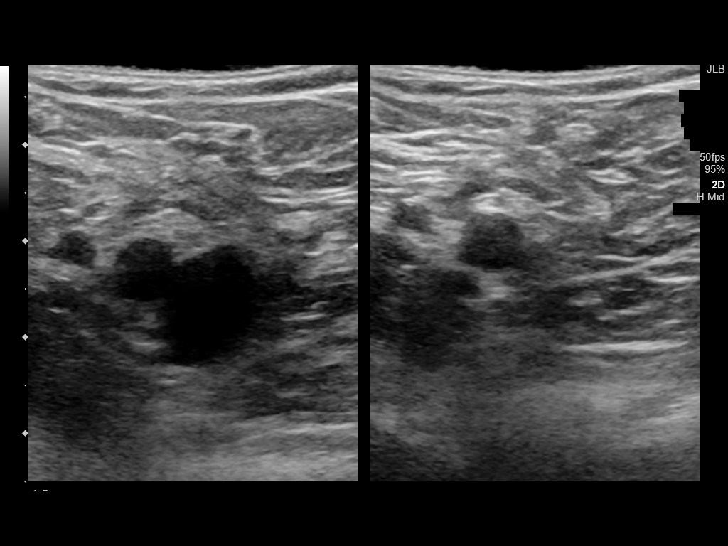
[im 7/36]
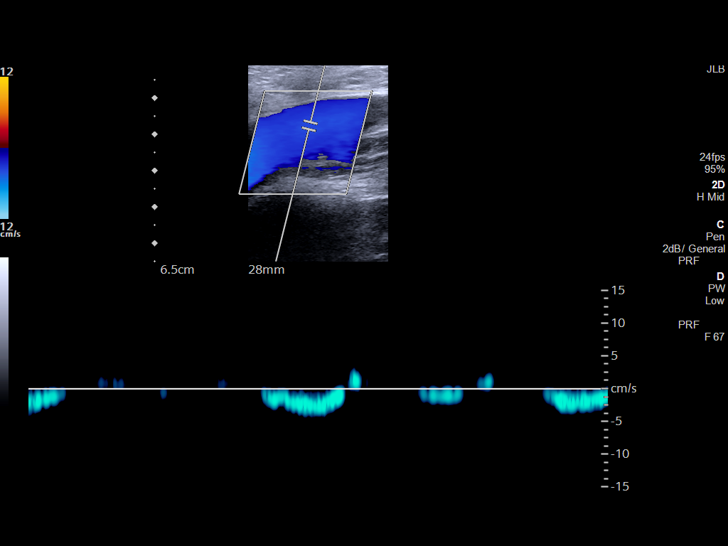
[im 10/36]
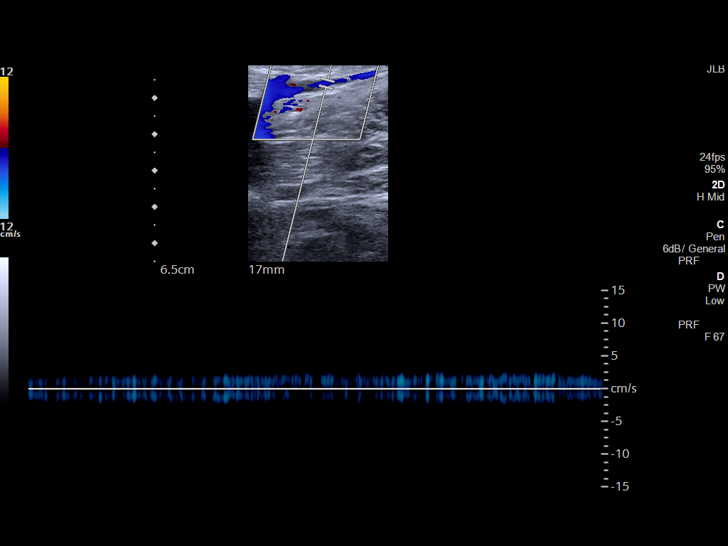
[im 13/36]
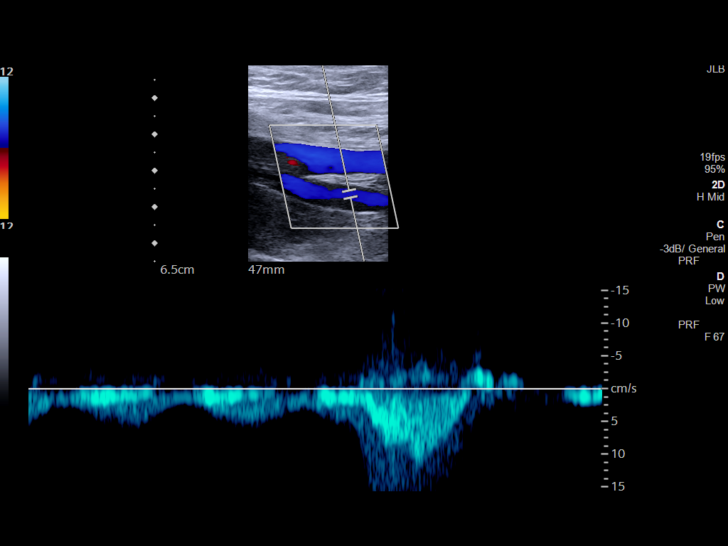
[im 16/36]
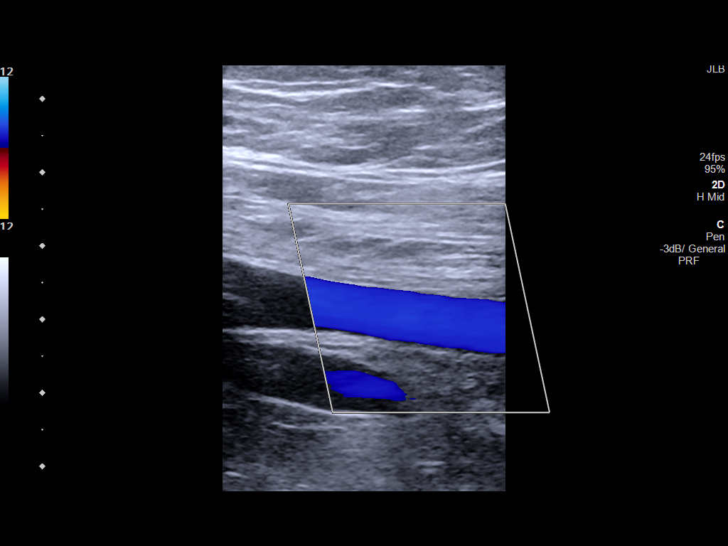
[im 19/36]
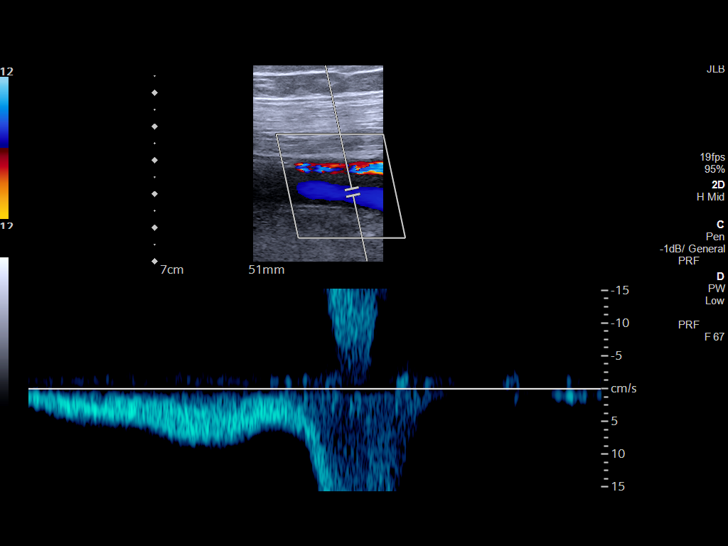
[im 20/36]
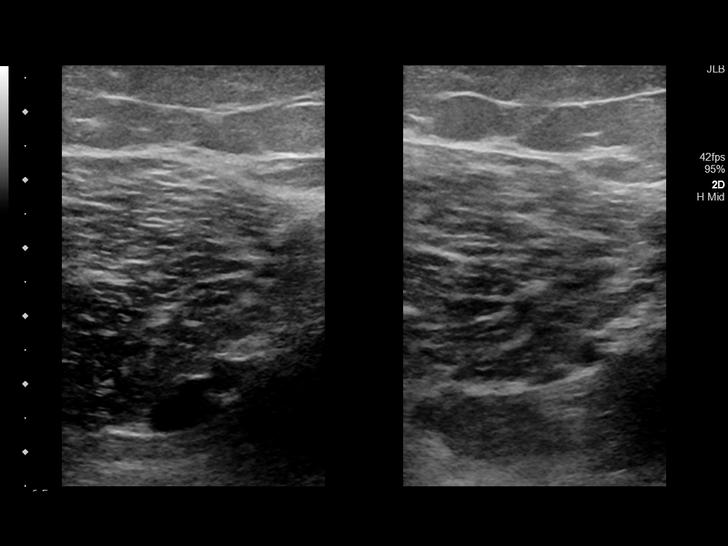
[im 23/36]
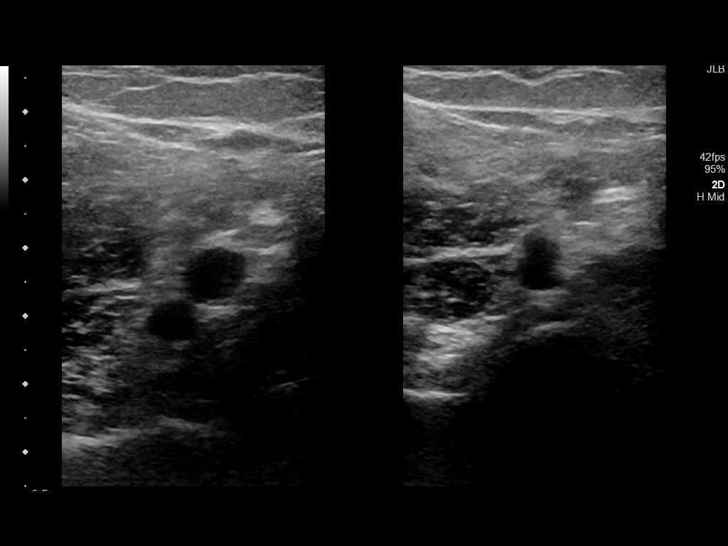
[im 26/36]
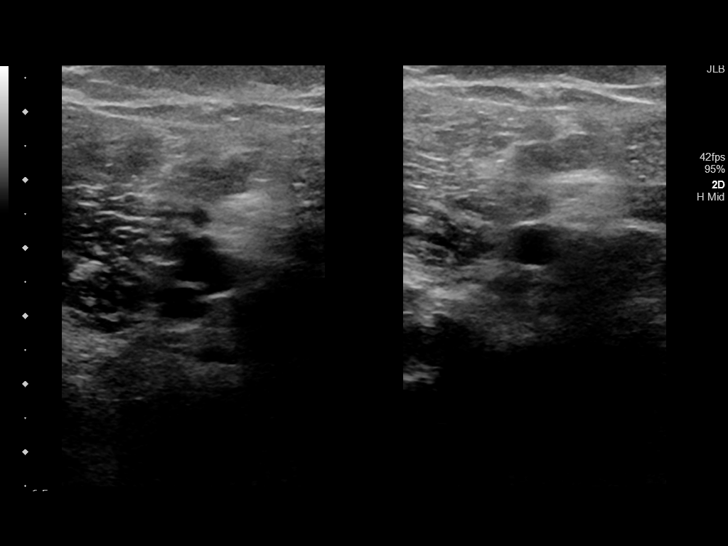
[im 29/36]
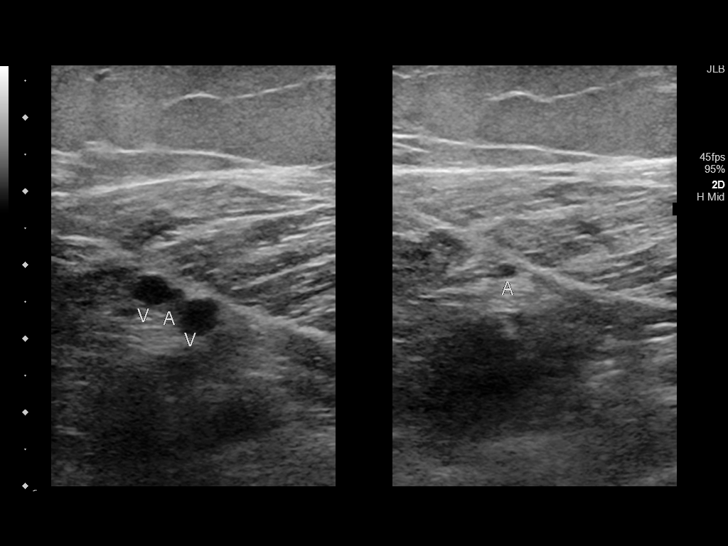
[im 32/36]
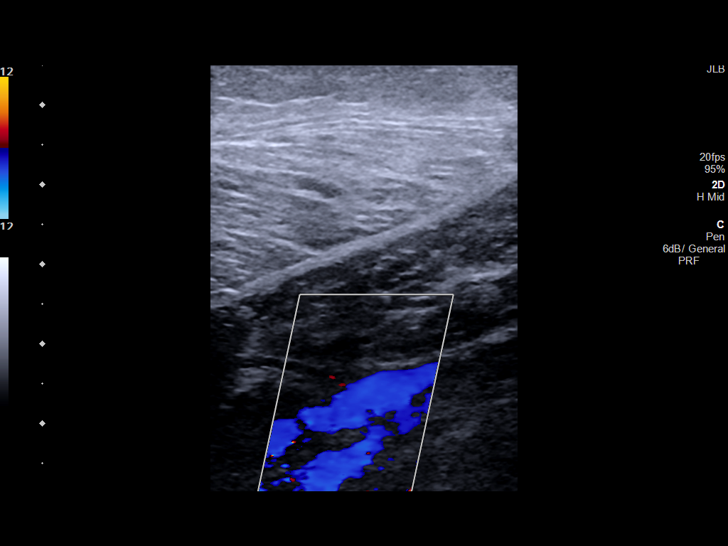
[im 36/36]
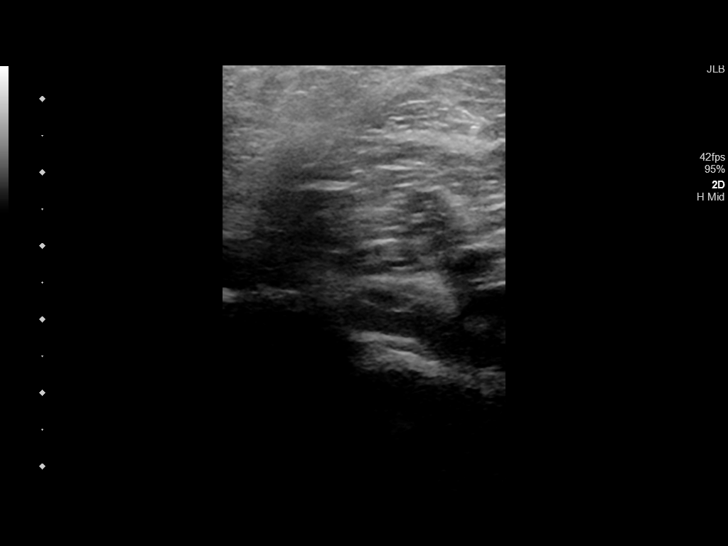

[13 of 24 positions shown; findings below may reference images not displayed]

FINDINGS: Contralateral Common Femoral Vein: Respiratory phasicity is normal
and symmetric with the symptomatic side. No evidence of thrombus.
Normal compressibility.

Common Femoral Vein: No evidence of thrombus. Normal
compressibility, respiratory phasicity and response to augmentation.

Saphenofemoral Junction: No evidence of thrombus. Normal
compressibility and flow on color Doppler imaging.

Profunda Femoral Vein: No evidence of thrombus. Normal
compressibility and flow on color Doppler imaging.

Femoral Vein: No evidence of thrombus. Normal compressibility,
respiratory phasicity and response to augmentation.

Popliteal Vein: No evidence of thrombus. Normal compressibility,
respiratory phasicity and response to augmentation.

Calf Veins: No evidence of thrombus. Normal compressibility and flow
on color Doppler imaging.

Venous Reflux:  None.

Other Findings:  None.
IMPRESSION: No evidence of deep venous thrombosis.

## 2019-11-06 ENCOUNTER — Other Ambulatory Visit: Payer: Managed Care, Other (non HMO)

## 2019-11-07 ENCOUNTER — Ambulatory Visit: Payer: Managed Care, Other (non HMO)

## 2019-11-07 ENCOUNTER — Ambulatory Visit: Payer: Managed Care, Other (non HMO) | Admitting: Hematology and Oncology

## 2019-11-15 ENCOUNTER — Telehealth: Payer: Self-pay | Admitting: Obstetrics and Gynecology

## 2019-11-15 NOTE — Telephone Encounter (Signed)
Pt aware of neg MyRisk testing results. IBIS=7.8%. No furthe screening indicated.  Patient understands these results only apply to her and her children, and this is not indicative of genetic testing results of her other family members. It is recommended that her other family members have genetic testing done.  Pt also understands negative genetic testing doesn't mean she will never get any of these cancers.   Hard copy mailed to pt. F/u prn.

## 2019-11-21 ENCOUNTER — Other Ambulatory Visit: Payer: Self-pay

## 2019-11-21 DIAGNOSIS — D508 Other iron deficiency anemias: Secondary | ICD-10-CM

## 2019-11-26 ENCOUNTER — Other Ambulatory Visit: Payer: Self-pay

## 2019-11-26 ENCOUNTER — Inpatient Hospital Stay: Payer: 59 | Attending: Hematology and Oncology

## 2019-11-26 DIAGNOSIS — Z803 Family history of malignant neoplasm of breast: Secondary | ICD-10-CM | POA: Diagnosis not present

## 2019-11-26 DIAGNOSIS — D508 Other iron deficiency anemias: Secondary | ICD-10-CM

## 2019-11-26 DIAGNOSIS — G4730 Sleep apnea, unspecified: Secondary | ICD-10-CM | POA: Diagnosis not present

## 2019-11-26 DIAGNOSIS — Z800 Family history of malignant neoplasm of digestive organs: Secondary | ICD-10-CM | POA: Diagnosis not present

## 2019-11-26 DIAGNOSIS — Z8249 Family history of ischemic heart disease and other diseases of the circulatory system: Secondary | ICD-10-CM | POA: Diagnosis not present

## 2019-11-26 DIAGNOSIS — Z794 Long term (current) use of insulin: Secondary | ICD-10-CM | POA: Diagnosis not present

## 2019-11-26 DIAGNOSIS — Z8041 Family history of malignant neoplasm of ovary: Secondary | ICD-10-CM | POA: Insufficient documentation

## 2019-11-26 DIAGNOSIS — E119 Type 2 diabetes mellitus without complications: Secondary | ICD-10-CM | POA: Insufficient documentation

## 2019-11-26 DIAGNOSIS — Z79899 Other long term (current) drug therapy: Secondary | ICD-10-CM | POA: Insufficient documentation

## 2019-11-26 DIAGNOSIS — Z833 Family history of diabetes mellitus: Secondary | ICD-10-CM | POA: Insufficient documentation

## 2019-11-26 DIAGNOSIS — Z7951 Long term (current) use of inhaled steroids: Secondary | ICD-10-CM | POA: Insufficient documentation

## 2019-11-26 DIAGNOSIS — Z8042 Family history of malignant neoplasm of prostate: Secondary | ICD-10-CM | POA: Insufficient documentation

## 2019-11-26 DIAGNOSIS — Z8349 Family history of other endocrine, nutritional and metabolic diseases: Secondary | ICD-10-CM | POA: Diagnosis not present

## 2019-11-26 DIAGNOSIS — I10 Essential (primary) hypertension: Secondary | ICD-10-CM | POA: Diagnosis not present

## 2019-11-26 DIAGNOSIS — D509 Iron deficiency anemia, unspecified: Secondary | ICD-10-CM | POA: Diagnosis not present

## 2019-11-26 DIAGNOSIS — J45909 Unspecified asthma, uncomplicated: Secondary | ICD-10-CM | POA: Diagnosis not present

## 2019-11-26 LAB — CBC WITH DIFFERENTIAL/PLATELET
Abs Immature Granulocytes: 0.01 10*3/uL (ref 0.00–0.07)
Basophils Absolute: 0 10*3/uL (ref 0.0–0.1)
Basophils Relative: 0 %
Eosinophils Absolute: 0.2 10*3/uL (ref 0.0–0.5)
Eosinophils Relative: 3 %
HCT: 41.9 % (ref 36.0–46.0)
Hemoglobin: 13.3 g/dL (ref 12.0–15.0)
Immature Granulocytes: 0 %
Lymphocytes Relative: 28 %
Lymphs Abs: 1.5 10*3/uL (ref 0.7–4.0)
MCH: 24.5 pg — ABNORMAL LOW (ref 26.0–34.0)
MCHC: 31.7 g/dL (ref 30.0–36.0)
MCV: 77.2 fL — ABNORMAL LOW (ref 80.0–100.0)
Monocytes Absolute: 0.5 10*3/uL (ref 0.1–1.0)
Monocytes Relative: 9 %
Neutro Abs: 3.3 10*3/uL (ref 1.7–7.7)
Neutrophils Relative %: 60 %
Platelets: 414 10*3/uL — ABNORMAL HIGH (ref 150–400)
RBC: 5.43 MIL/uL — ABNORMAL HIGH (ref 3.87–5.11)
RDW: 16.7 % — ABNORMAL HIGH (ref 11.5–15.5)
WBC: 5.5 10*3/uL (ref 4.0–10.5)
nRBC: 0 % (ref 0.0–0.2)

## 2019-11-27 ENCOUNTER — Ambulatory Visit: Payer: Managed Care, Other (non HMO) | Admitting: Hematology and Oncology

## 2019-11-27 ENCOUNTER — Ambulatory Visit: Payer: Managed Care, Other (non HMO)

## 2019-11-27 LAB — FERRITIN: Ferritin: 14 ng/mL (ref 11–307)

## 2019-11-29 ENCOUNTER — Ambulatory Visit: Admit: 2019-11-29 | Payer: Managed Care, Other (non HMO) | Admitting: Gastroenterology

## 2019-11-29 SURGERY — COLONOSCOPY WITH PROPOFOL
Anesthesia: Choice

## 2019-12-02 ENCOUNTER — Other Ambulatory Visit: Payer: Self-pay

## 2019-12-02 DIAGNOSIS — I10 Essential (primary) hypertension: Secondary | ICD-10-CM

## 2019-12-02 MED ORDER — AMLODIPINE BESYLATE 5 MG PO TABS: 5.0000 mg | ORAL_TABLET | Freq: Every day | ORAL | 3 refills | Status: DC

## 2019-12-02 NOTE — Progress Notes (Signed)
[facility]  [address], [address] [place],  [address] Phone: [phone]  Fax: [phone]   Clinic Day:  12/03/2019  Referring physician: Delsa Grana, PA-C  Chief Complaint: Donna Reyes is a 44 y.o. female with iron deficiency anemia andreactivethrombocytosis who is seen for 6 month assessment.   HPI: The patient was last seen in the hematology clinic on 05/09/2019.  At that time, she had been tired for 2 days.  Recent menses was heavy.  Exam revealed a tender right axillary cystic/nodular area. Hematocrit was 33.9, hemoglobin 10.5, MCV 80.3, platelets 474,000, WBC 3,900 (ANC 1,900). Ferritin was 5. Coagulation study was not c/w a diagnosis of VWD. She received Venofer.  The patient saw Faythe Casa, NP on 05/22/2019 in the symptom management clinic. She had right axillary adenopathy. Diagnostic mammogram and right axillary ultrasound revealed persistent asymmetric right axillary lymphadenopathy and no evidence of malignancy. Surgical consultation for excision was recommended.   The patient was scheduled for a colonoscopy on 11/29/2019 but the procedure was cancelled because her insurance changed. She is going to reschedule for 01/2020.  Labs followed: 05/22/2019: Hematocrit 38.1, hemoglobin 11.5, MCV 83.2, platelets 425,000, WBC 4,800. Normal CMP. LDH 134. 08/06/2019: Hematocrit 35.1, hemoglobin 11.2, MCV 81.6, platelets 453,000, WBC 4,000. Ferritin 11. 10/10/2019: Hematocrit 34.3, hemoglobin 10.9, MCV 76.9, platelets 459,000, WBC 4,700. Ferritin   6. 11/26/2019: Hematocrit 41.9, hemoglobin 13.3, MCV 77.2, platelets 414,000, WBC 5,500. Ferritin 14.  She received Venofer on 05/17/2019, 05/31/2019, 06/06/2019, and 10/15/2019.  During the interim, she has been "ok".  She is a little bit tired.  She denies ice pica and restless legs. She states that she has been getting cold at night for the past 2 months. The patient was put on  Depo-Provera a couple of months ago, which stopped her periods completely. She has had some weight gain due to the Depo-Provera.  She denies bleeding of any kind.  The patient states that she followed up with Dr. Bary Castilla about her right axillary lymph node; he did not recommend surgical intervention. She is to follow up with him as needed.   Past Medical History:  Diagnosis Date  . Allergy   . Anemia   . Asthma   . BRCA negative 10/2019   MyRisk neg; IBIS=7.8%  . Diabetes mellitus without complication ([place])   . Family history of pancreatic cancer   . Hypertension   . Sleep apnea     Past Surgical History:  Procedure Laterality Date  . AXILLARY LYMPH NODE BIOPSY Right 05/25/2017   LYMPHADENITIS. NEGATIVE FOR MALIGNANCY.   Marland Kitchen BREAST BIOPSY Right 05/25/2017   neg/NEGATIVE FOR CARCINOMA. INFLAMMATION AND SQUAMOUS METAPLASIA OF A FOCAL DUCT   . CESAREAN SECTION     times 3  . TUBAL LIGATION      Family History  Problem Relation Age of Onset  . Diabetes Mother   . Heart failure Mother   . Kidney disease Mother   . Diabetes Maternal Grandmother   . Breast cancer Maternal Grandmother 76  . Hypertension Father   . Hyperlipidemia Father   . Atrial fibrillation Sister   . ADD / ADHD Son   . Heart attack Maternal Grandfather   . Diabetes Paternal Grandmother   . Hypertension Paternal Grandmother   . Prostate cancer Paternal Grandfather   . Colon cancer Paternal Grandfather 85  . Polycystic ovary syndrome Sister   . Polycystic ovary syndrome Sister   . Pancreatic cancer Paternal Aunt 25  . Ovarian cancer  Neg Hx     Social History:  reports that she has never smoked. She has never used smokeless tobacco. She reports that she does not drink alcohol and does not use drugs. [place]. She is now divorced.She does not smoke or drink. She denies exposure to radiation or toxins. 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 The patient is alone today.  Allergies:  Allergies  Allergen Reactions  . Lisinopril     Current Medications: Current Outpatient Medications  Medication Sig Dispense Refill  . albuterol (VENTOLIN HFA) 108 (90 Base) MCG/ACT inhaler Inhale 2 puffs into the lungs every 6 (six) hours as needed for wheezing or shortness of breath. 8 g 0  . amLODipine (NORVASC) 5 MG tablet Take 1 tablet (5 mg total) by mouth daily. 90 tablet 3  . blood glucose meter kit and supplies KIT Dispense based on patient and insurance preference. Use up to four times daily as directed. (FOR ICD-9 250.00, 250.01). 1 each 0  . cetirizine (ZYRTEC) 10 MG tablet Take 1 tablet (10 mg total) by mouth daily. 30 tablet 11  . Fluticasone-Salmeterol (ADVAIR DISKUS) 100-50 MCG/DOSE AEPB     . Insulin Pen Needle (NOVOFINE) 32G X 6 MM MISC 1 each by Does not apply route once a week. 50 each 1  . montelukast (SINGULAIR) 10 MG tablet Take 10 mg by mouth at bedtime.    . Semaglutide, 1 MG/DOSE, (OZEMPIC, 1 MG/DOSE,) 2 MG/1.5ML SOPN Inject 1 mg into the skin once a week. 3 pen 3  . medroxyPROGESTERone Acetate 150 MG/ML SUSY Inject 1 mL (150 mg total) into the muscle once for 1 dose. Q 81month 1 mL 3   No current facility-administered medications for this visit.    Review of Systems  Constitutional: Positive for malaise/fatigue (mild). Negative for chills, diaphoresis, fever and weight loss (up 3 lbs).       Feels "ok". Gets cold at night.  HENT: Negative.  Negative for congestion, ear discharge, ear pain, hearing loss, nosebleeds, sinus pain, sore throat and tinnitus.   Eyes: Negative.  Negative for blurred vision and double vision.  Respiratory: Negative.  Negative for cough, hemoptysis, sputum production and shortness of breath.   Cardiovascular: Negative.  Negative for chest pain, palpitations, orthopnea, leg swelling and PND.  Gastrointestinal: Negative.  Negative for abdominal pain, blood in stool,  constipation, diarrhea, heartburn, melena, nausea and vomiting.       No ice pica. Eating well.  Genitourinary: Negative for dysuria, frequency, hematuria and urgency.       On DepoProvera, has no periods  Musculoskeletal: Negative.  Negative for back pain, joint pain, myalgias and neck pain.  Skin: Negative.  Negative for itching and rash.  Neurological: Negative.  Negative for dizziness, sensory change, focal weakness, weakness and headaches.       No restless legs.  Endo/Heme/Allergies: Negative.  Does not bruise/bleed easily.  Psychiatric/Behavioral: Negative.  Negative for depression and memory loss. The patient is not nervous/anxious and does not have insomnia.   All other systems reviewed and are negative.  Performance status (ECOG): 1  Vitals Blood pressure (!) 140/91, pulse 92, temperature (!) 97 F (36.1 C), temperature source Tympanic, resp. rate 18, weight 205 lb 0.4 oz (93 kg), SpO2 99 %.   Physical Exam Vitals and nursing note reviewed.  Constitutional:      General: She is not in acute distress.    Appearance: She is  well-developed. She is not diaphoretic.  HENT:     Head: Normocephalic and atraumatic.     Comments: Short styled hair.    Mouth/Throat:     Pharynx: No oropharyngeal exudate.  Eyes:     Conjunctiva/sclera: Conjunctivae normal.     Pupils: Pupils are equal, round, and reactive to light.     Comments: Contacts.  Neck:     Vascular: No JVD.  Cardiovascular:     Rate and Rhythm: Normal rate and regular rhythm.     Heart sounds: Normal heart sounds. No murmur heard.  No friction rub. No gallop.   Pulmonary:     Effort: Pulmonary effort is normal. No respiratory distress.     Breath sounds: Normal breath sounds. No wheezing or rales.  Abdominal:     General: Bowel sounds are normal. There is no distension.     Palpations: Abdomen is soft. There is no hepatomegaly or splenomegaly.     Tenderness: There is no abdominal tenderness. There is no guarding  or rebound.  Musculoskeletal:        General: No tenderness. Normal range of motion.     Cervical back: Normal range of motion and neck supple.  Lymphadenopathy:     Head:     Right side of head: No preauricular, posterior auricular or occipital adenopathy.     Left side of head: No preauricular, posterior auricular or occipital adenopathy.     Cervical: No cervical adenopathy.     Upper Body:     Right upper body: No supraclavicular or axillary adenopathy.     Left upper body: No supraclavicular or axillary adenopathy.     Lower Body: No right inguinal adenopathy. No left inguinal adenopathy.  Skin:    General: Skin is warm and dry.     Coloration: Skin is not pale.     Findings: No erythema or rash.  Neurological:     Mental Status: She is alert and oriented to person, place, and time.  Psychiatric:        Behavior: Behavior normal.        Thought Content: Thought content normal.        Judgment: Judgment normal.     No visits with results within 3 Day(s) from this visit.  Latest known visit with results is:  Appointment on 11/26/2019  Component Date Value Ref Range Status  . WBC 11/26/2019 5.5  4.0 - 10.5 K/uL Final  . RBC 11/26/2019 5.43* 3.87 - 5.11 MIL/uL Final  . Hemoglobin 11/26/2019 13.3  12.0 - 15.0 g/dL Final  . HCT 11/26/2019 41.9  36 - 46 % Final  . MCV 11/26/2019 77.2* 80.0 - 100.0 fL Final  . MCH 11/26/2019 24.5* 26.0 - 34.0 pg Final  . MCHC 11/26/2019 31.7  30.0 - 36.0 g/dL Final  . RDW 11/26/2019 16.7* 11.5 - 15.5 % Final  . Platelets 11/26/2019 414* 150 - 400 K/uL Final  . nRBC 11/26/2019 0.0  0.0 - 0.2 % Final  . Neutrophils Relative % 11/26/2019 60  % Final  . Neutro Abs 11/26/2019 3.3  1.7 - 7.7 K/uL Final  . Lymphocytes Relative 11/26/2019 28  % Final  . Lymphs Abs 11/26/2019 1.5  0.7 - 4.0 K/uL Final  . Monocytes Relative 11/26/2019 9  % Final  . Monocytes Absolute 11/26/2019 0.5  0 - 1 K/uL Final  . Eosinophils Relative 11/26/2019 3  % Final  .  Eosinophils Absolute 11/26/2019 0.2  0 - 0 K/uL Final  .  Basophils Relative 11/26/2019 0  % Final  . Basophils Absolute 11/26/2019 0.0  0 - 0 K/uL Final  . Immature Granulocytes 11/26/2019 0  % Final  . Abs Immature Granulocytes 11/26/2019 0.01  0.00 - 0.07 K/uL Final   Performed at [facility], [address], [place], [place] [address]  . Ferritin 11/26/2019 14  11 - 307 ng/mL Final   Performed at [facility], [address] [place], [place] [address]    Assessment:  Darin Redmann is a 44 y.o. female withiron deficiency anemiaand reactive thrombocytosis. Etiology is felt secondary to menorrhagia. Dietis fairly good.Sheis intolerant oforal iron.  She has a history of an [place] a young age.She [place]GDin [place]. Report is unavailable.She is no longer on a PPI.  Work-up on09/27/2017revealed a hematocrit of 27.4, hemoglobin 8.0, MCV 60.3, platelets391,000, and WBC 4400.Ferritinwas 4 with an iron saturation of 2% and a TIBC of 499. Normal studies included:B12,folate,Coombs,ANA, and hemoglobin electrophoresis.  Von Willebrand testing was normal on 11/06/2018.  She [place]. She received Gustavo Lah 01/08/2016, 01/15/2016, 11/10/2017, 11/17/2017, and 01/05/2018.  She received Venofer x 3 (11/06/2018 - 11/23/2018).  Ferritinhas been followed: 4 on 01/01/2016, 33 on 02/29/2016, 4 on 10/31/2017, 16 on 01/04/2018, 9 on 06/11/2018,6on 08/13/2018, 9 on 11/05/2018, and 5 on 05/08/2019.   Shehas a history of right breast lump and adenopathyin 2019. Biopsies on 02/21/2019werenegative for malignancy.Pathology revealedlymphadenitis.  Symptomatically, she feels "ok".  She is a little bit tired.  She denies ice pica and restless legs.  She denies any bleeding.  Exam reveals no palpable adenopathy  Plan: 1.   Review labs from 11/26/2019. 2. Iron deficiency  anemia Hematocrit 41.9.  Hemoglobin 13.3.  MCV 77.2.               Ferritin 14.    Ferritin goal 100. Heavy menses controlled with Depo-Provera. Venofer today and in 1 week. 3.   Bleeding diathesis Patient with heavy menses. Von Willebrand testing was negative.   Patient started on Depo-Provera with resolution of heavy menses 4.   Right axillary adenopathy Adenopathy resolved.  She has a history of lymphadenitis.  She has been seen by Dr. Bary Castilla.  No intervention was felt necessary.  Continue to monitor.. 5.   Venofer today and in 1 week. 6.   RTC in 3 months for labs (CBC, ferritin). 7.   TC in 6 months for MD assessment, labs (CBC with diff, ferritin- day before) and +/- Venofer  I discussed the assessment and treatment plan with the patient.  The patient was provided an opportunity to ask questions and all were answered.  The patient agreed with the plan and demonstrated an understanding of the instructions.  The patient was advised to call back if the symptoms worsen or if the condition fails to improve as anticipated.   Lequita Asal, MD, PhD    12/03/2019, 2:22 PM  I, Mirian Mo Tufford, am acting as Education administrator for [employer]. Mike Gip, MD, PhD.  I, Mera Gunkel C. Mike Gip, MD, have reviewed the above documentation for accuracy and completeness, and I agree with the above.

## 2019-12-03 ENCOUNTER — Other Ambulatory Visit: Payer: Self-pay

## 2019-12-03 ENCOUNTER — Encounter: Payer: Self-pay | Admitting: Hematology and Oncology

## 2019-12-03 ENCOUNTER — Inpatient Hospital Stay (HOSPITAL_BASED_OUTPATIENT_CLINIC_OR_DEPARTMENT_OTHER): Payer: 59 | Admitting: Hematology and Oncology

## 2019-12-03 ENCOUNTER — Inpatient Hospital Stay: Payer: 59

## 2019-12-03 VITALS — BP 142/91 | HR 90 | Resp 18

## 2019-12-03 VITALS — BP 140/91 | HR 92 | Temp 97.0°F | Resp 18 | Wt 205.0 lb

## 2019-12-03 DIAGNOSIS — Z803 Family history of malignant neoplasm of breast: Secondary | ICD-10-CM | POA: Diagnosis not present

## 2019-12-03 DIAGNOSIS — G4730 Sleep apnea, unspecified: Secondary | ICD-10-CM | POA: Diagnosis not present

## 2019-12-03 DIAGNOSIS — Z794 Long term (current) use of insulin: Secondary | ICD-10-CM | POA: Diagnosis not present

## 2019-12-03 DIAGNOSIS — D508 Other iron deficiency anemias: Secondary | ICD-10-CM

## 2019-12-03 DIAGNOSIS — Z79899 Other long term (current) drug therapy: Secondary | ICD-10-CM | POA: Diagnosis not present

## 2019-12-03 DIAGNOSIS — N920 Excessive and frequent menstruation with regular cycle: Secondary | ICD-10-CM

## 2019-12-03 DIAGNOSIS — I10 Essential (primary) hypertension: Secondary | ICD-10-CM | POA: Diagnosis not present

## 2019-12-03 DIAGNOSIS — D509 Iron deficiency anemia, unspecified: Secondary | ICD-10-CM | POA: Diagnosis not present

## 2019-12-03 DIAGNOSIS — D500 Iron deficiency anemia secondary to blood loss (chronic): Secondary | ICD-10-CM

## 2019-12-03 DIAGNOSIS — Z7951 Long term (current) use of inhaled steroids: Secondary | ICD-10-CM | POA: Diagnosis not present

## 2019-12-03 DIAGNOSIS — Z8041 Family history of malignant neoplasm of ovary: Secondary | ICD-10-CM | POA: Diagnosis not present

## 2019-12-03 DIAGNOSIS — J45909 Unspecified asthma, uncomplicated: Secondary | ICD-10-CM | POA: Diagnosis not present

## 2019-12-03 DIAGNOSIS — R590 Localized enlarged lymph nodes: Secondary | ICD-10-CM

## 2019-12-03 DIAGNOSIS — E119 Type 2 diabetes mellitus without complications: Secondary | ICD-10-CM | POA: Diagnosis not present

## 2019-12-03 MED ORDER — IRON SUCROSE 20 MG/ML IV SOLN
200.0000 mg | Freq: Once | INTRAVENOUS | Status: AC
  Administered 2019-12-03: 200 mg via INTRAVENOUS
  Filled 2019-12-03: qty 10

## 2019-12-03 MED ORDER — SODIUM CHLORIDE 0.9 % IV SOLN
Freq: Once | INTRAVENOUS | Status: AC
  Filled 2019-12-03: qty 250

## 2019-12-03 MED ORDER — SODIUM CHLORIDE 0.9 % IV SOLN: 200.0000 mg | Freq: Once | INTRAVENOUS | Status: DC

## 2019-12-03 NOTE — Progress Notes (Signed)
Patient has occasional numbness in fingertips. She has started depo to help control periods.

## 2019-12-09 ENCOUNTER — Other Ambulatory Visit: Payer: Self-pay

## 2019-12-09 ENCOUNTER — Ambulatory Visit
Admission: RE | Admit: 2019-12-09 | Discharge: 2019-12-09 | Disposition: A | Discharge status: Home / Self Care | Payer: 59 | Source: Ambulatory Visit | Attending: Emergency Medicine | Admitting: Emergency Medicine

## 2019-12-09 VITALS — BP 150/99 | HR 78 | Temp 98.8°F | Resp 18

## 2019-12-09 DIAGNOSIS — I10 Essential (primary) hypertension: Secondary | ICD-10-CM

## 2019-12-09 DIAGNOSIS — L0291 Cutaneous abscess, unspecified: Secondary | ICD-10-CM

## 2019-12-09 MED ORDER — SULFAMETHOXAZOLE-TRIMETHOPRIM 800-160 MG PO TABS: 1.0000 | ORAL_TABLET | Freq: Two times a day (BID) | ORAL | 0 refills | Status: DC

## 2019-12-09 NOTE — ED Provider Notes (Signed)
Donna Reyes      History   Chief Complaint Chief Complaint  Patient presents with  . Otalgia    HPI Donna Reyes is a 44 y.o. female.   Patient presents with a sore in her right ear canal x2 days.  Her canal was itching a couple of days ago and she scratched it.  She states the sore ruptured this morning and has been draining yellow purulent fluid.  She denies fever, chills, sore throat, cough, shortness of breath, vomiting, diarrhea, or other symptoms.  Patient attempted treatment at home by taking her son's Augmentin x 2 doses; this medication was prescribed a year ago for her son.  The history is provided by the patient.    Past Medical History:  Diagnosis Date  . Allergy   . Anemia   . Asthma   . BRCA negative 10/2019   MyRisk neg; IBIS=7.8%  . Diabetes mellitus without complication ([place])   . Family history of pancreatic cancer   . Hypertension   . Sleep apnea     Patient Active Problem List   Diagnosis Date Noted  . Family history of pancreatic cancer 10/10/2019  . Right ovarian cyst 10/10/2019  . Anxiety 03/05/2019  . Controlled type 2 diabetes mellitus with skin complication ([place]) 54/65/6812  . Palpable lymph node 08/17/2018  . Menorrhagia with regular cycle 08/14/2018  . Lymphadenopathy, axillary 10/27/2017  . Anemia 10/27/2017  . Chest pain 01/06/2016  . Essential hypertension 01/06/2016  . Mild intermittent asthma with acute exacerbation 01/06/2016  . Iron deficiency anemia 12/30/2015    Past Surgical History:  Procedure Laterality Date  . AXILLARY LYMPH NODE BIOPSY Right 05/25/2017   LYMPHADENITIS. NEGATIVE FOR MALIGNANCY.   Marland Kitchen BREAST BIOPSY Right 05/25/2017   neg/NEGATIVE FOR CARCINOMA. INFLAMMATION AND SQUAMOUS METAPLASIA OF A FOCAL DUCT   . CESAREAN SECTION     times 3  . TUBAL LIGATION      OB History    Gravida  5   Para  5   Term  5   Preterm      AB       Living  5     SAB      TAB      Ectopic      Multiple      Live Births  5        Obstetric Comments  1st Menstrual Cycle:  13 1st Pregnancy:  14          Home Medications    Prior to Admission medications   Medication Sig Start Date End Date Taking? Authorizing Provider  albuterol (VENTOLIN HFA) 108 (90 Base) MCG/ACT inhaler Inhale 2 puffs into the lungs every 6 (six) hours as needed for wheezing or shortness of breath. 01/01/19  Yes Merlyn Lot, MD  amLODipine (NORVASC) 5 MG tablet Take 1 tablet (5 mg total) by mouth daily. 12/02/19  Yes Delsa Grana, PA-C  blood glucose meter kit and supplies KIT Dispense based on patient and insurance preference. Use up to four times daily as directed. (FOR ICD-9 250.00, 250.01). 08/19/19  Yes Sowles, Drue Stager, MD  cetirizine (ZYRTEC) 10 MG tablet Take 1 tablet (10 mg total) by mouth daily. 02/15/19  Yes Lily Kocher, PA-C  Fluticasone-Salmeterol (ADVAIR DISKUS) 100-50 MCG/DOSE AEPB  08/26/19  Yes [provider]  Insulin Pen Needle (NOVOFINE) 32G X 6 MM MISC 1 each by Does not apply route  once a week. 08/28/18  Yes Hubbard Hartshorn, FNP  medroxyPROGESTERone Acetate 150 MG/ML SUSY Inject 1 mL (150 mg total) into the muscle once for 1 dose. Q 70month 10/10/19 90/6/23Yes Copland, Alicia B, PA-C  montelukast (SINGULAIR) 10 MG tablet Take 10 mg by mouth at bedtime. 08/26/19  Yes [provider]  Semaglutide, 1 MG/DOSE, (OZEMPIC, 1 MG/DOSE,) 2 MG/1.5ML SOPN Inject 1 mg into the skin once a week. 03/05/19  Yes BHubbard Hartshorn FNP  sulfamethoxazole-trimethoprim (BACTRIM DS) 800-160 MG tablet Take 1 tablet by mouth 2 (two) times daily for 7 days. 12/09/19 12/16/19  TSharion Balloon NP    Family History Family History  Problem Relation Age of Onset  . Diabetes Mother   . Heart failure Mother   . Kidney disease Mother   . Diabetes Maternal Grandmother   . Breast cancer Maternal Grandmother 622 . Hypertension Father   .  Hyperlipidemia Father   . Atrial fibrillation Sister   . ADD / ADHD Son   . Heart attack Maternal Grandfather   . Diabetes Paternal Grandmother   . Hypertension Paternal Grandmother   . Prostate cancer Paternal Grandfather   . Colon cancer Paternal Grandfather 611 . Polycystic ovary syndrome Sister   . Polycystic ovary syndrome Sister   . Pancreatic cancer Paternal Aunt 6103 . Ovarian cancer Neg Hx     Social History Social History   Tobacco Use  . Smoking status: Never Smoker  . Smokeless tobacco: Never Used  Vaping Use  . Vaping Use: Never used  Substance Use Topics  . Alcohol use: No  . Drug use: No     Allergies   Lisinopril   Review of Systems Review of Systems  Constitutional: Negative for chills and fever.  HENT: Negative for ear pain and sore throat.   Eyes: Negative for pain and visual disturbance.  Respiratory: Negative for cough and shortness of breath.   Cardiovascular: Negative for chest pain and palpitations.  Gastrointestinal: Negative for abdominal pain and vomiting.  Genitourinary: Negative for dysuria and hematuria.  Musculoskeletal: Negative for arthralgias and back pain.  Skin: Positive for wound. Negative for color change and rash.  Neurological: Negative for seizures and syncope.  All other systems reviewed and are negative.    Physical Exam Triage Vital Signs ED Triage Vitals  Enc Vitals Group     BP      Pulse      Resp      Temp      Temp src      SpO2      Weight      Height      Head Circumference      Peak Flow      Pain Score      Pain Loc      Pain Edu?      Excl. in [place]    No data found.  Updated Vital Signs BP (!) 150/99   Pulse 78   Temp 98.8 F (37.1 C) (Oral)   Resp 18   LMP 11/21/2019   SpO2 100%   Visual Acuity Right Eye Distance:   Left Eye Distance:   Bilateral Distance:    Right Eye Near:   Left Eye Near:    Bilateral Near:     Physical Exam Vitals and nursing note reviewed.   Constitutional:      General: She is not in acute distress.    Appearance: She is well-developed.  HENT:     Head: Normocephalic and atraumatic.     Right Ear: Tympanic membrane normal.     Left Ear: Tympanic membrane normal.     Nose: Nose normal.     Mouth/Throat:     Mouth: Mucous membranes are moist.     Pharynx: Oropharynx is clear.  Eyes:     Conjunctiva/sclera: Conjunctivae normal.  Cardiovascular:     Rate and Rhythm: Normal rate and regular rhythm.     Heart sounds: No murmur heard.   Pulmonary:     Effort: Pulmonary effort is normal. No respiratory distress.     Breath sounds: Normal breath sounds.  Abdominal:     Palpations: Abdomen is soft.     Tenderness: There is no abdominal tenderness. There is no guarding or rebound.  Musculoskeletal:     Cervical back: Neck supple.  Skin:    General: Skin is warm and dry.     Findings: Lesion present.     Comments: 1 cm abscess at entrance of right ear canal with scant yellow drainage.   Neurological:     General: No focal deficit present.     Mental Status: She is alert and oriented to person, place, and time.     Gait: Gait normal.  Psychiatric:        Mood and Affect: Mood normal.        Behavior: Behavior normal.      UC Treatments / Results  Labs (all labs ordered are listed, but only abnormal results are displayed) Labs Reviewed - No data to display  EKG   Radiology No results found.  Procedures Procedures (including critical care time)  Medications Ordered in UC Medications - No data to display  Initial Impression / Assessment and Plan / UC Course  I have reviewed the triage vital signs and the nursing notes.  Pertinent labs & imaging results that were available during my care of the patient were reviewed by me and considered in my medical decision making (see chart for details).    Abscess at entrance of right ear canal.  Elevated blood pressure with known HTN.   Wound care instructions and  signs of worsening infection discussed with patient.  Treating with Septra DS.  Discussed that her blood pressure is elevated today and needs to be rechecked by her PCP in 2 to 4 weeks.  Patient agrees to plan of care.  Final Clinical Impressions(s) / UC Diagnoses   Final diagnoses:  Abscess  Elevated blood pressure reading in office with diagnosis of hypertension     Discharge Instructions     Keep your wound clean and dry.  Wash it gently twice a day with soap and water.    Follow up with your primary care provider if your symptoms are not improving.    Your blood pressure is elevated today at 150/99.  Please have this rechecked by your primary care provider in 2-4 weeks.           ED Prescriptions    Medication Sig Dispense Auth. Provider   sulfamethoxazole-trimethoprim (BACTRIM DS) 800-160 MG tablet Take 1 tablet by mouth 2 (two) times daily for 7 days. 14 tablet Sharion Balloon, NP     PDMP not reviewed this encounter.   Sharion Balloon, NP 12/09/19 1453

## 2019-12-09 NOTE — Discharge Instructions (Addendum)
Keep your wound clean and dry.  Wash it gently twice a day with soap and water.    Follow up with your primary care provider if your symptoms are not improving.    Your blood pressure is elevated today at 150/99.  Please have this rechecked by your primary care provider in 2-4 weeks.

## 2019-12-09 NOTE — ED Triage Notes (Signed)
Pt c/o right ear pain onset 2 days ago. Pt states she felt like there was possibly a cyst in her ear. She took tylenol and 2 augmentin 875 mg, one last night and this morning. Pt states this morning around 4 am it began draining. Pt states ear is still painful.

## 2019-12-10 ENCOUNTER — Inpatient Hospital Stay: Payer: 59 | Attending: Hematology and Oncology

## 2019-12-10 VITALS — BP 128/86 | HR 80 | Resp 18

## 2019-12-10 DIAGNOSIS — D508 Other iron deficiency anemias: Secondary | ICD-10-CM

## 2019-12-10 DIAGNOSIS — D500 Iron deficiency anemia secondary to blood loss (chronic): Secondary | ICD-10-CM | POA: Diagnosis not present

## 2019-12-10 DIAGNOSIS — N920 Excessive and frequent menstruation with regular cycle: Secondary | ICD-10-CM | POA: Diagnosis not present

## 2019-12-10 MED ORDER — IRON SUCROSE 20 MG/ML IV SOLN
200.0000 mg | Freq: Once | INTRAVENOUS | Status: AC
  Administered 2019-12-10: 200 mg via INTRAVENOUS
  Filled 2019-12-10: qty 10

## 2019-12-10 MED ORDER — SODIUM CHLORIDE 0.9 % IV SOLN
INTRAVENOUS | Status: DC
  Filled 2019-12-10: qty 250

## 2019-12-10 MED ORDER — SODIUM CHLORIDE 0.9 % IV SOLN: 200.0000 mg | Freq: Once | INTRAVENOUS | Status: DC

## 2019-12-12 ENCOUNTER — Ambulatory Visit: Payer: Managed Care, Other (non HMO) | Admitting: Internal Medicine

## 2019-12-12 ENCOUNTER — Encounter: Payer: Self-pay | Admitting: Internal Medicine

## 2019-12-12 ENCOUNTER — Other Ambulatory Visit: Payer: Self-pay

## 2019-12-12 VITALS — BP 130/82 | HR 94 | Temp 98.3°F | Resp 16 | Ht 62.0 in | Wt 206.4 lb

## 2019-12-12 DIAGNOSIS — I10 Essential (primary) hypertension: Secondary | ICD-10-CM

## 2019-12-12 DIAGNOSIS — H6001 Abscess of right external ear: Secondary | ICD-10-CM | POA: Insufficient documentation

## 2019-12-12 DIAGNOSIS — E11628 Type 2 diabetes mellitus with other skin complications: Secondary | ICD-10-CM | POA: Diagnosis not present

## 2019-12-12 NOTE — Progress Notes (Signed)
Patient ID: Donna Reyes, female    DOB: 1976-03-12, 44 y.o.   MRN: [id number]  PCP: Delsa Grana, PA-C  Chief Complaint  Patient presents with  . Hypertension    reports BP has been running in the 140's 150's range which is abnormal for her. Patient not feeling well  . Otalgia    was seen at urgent care and put on Bactrim to treat abcess in ear, bactrim made her sick so she stopped it    Subjective:   Donna Reyes is a 44 y.o. female, presents to clinic with CC of the following:  Chief Complaint  Patient presents with  . Hypertension    reports BP has been running in the 140's 150's range which is abnormal for her. Patient not feeling well  . Otalgia    was seen at urgent care and put on Bactrim to treat abcess in ear, bactrim made her sick so she stopped it    HPI:  Patient is a 44 year old female patient of Delsa Grana Was seen in urgent care 12/09/2019 for an ear abscess concern. Her blood pressure on that visit was 150/99, and was recommended to have that checked with her PCP in a 2 to 4-week timeframe. She follows up today  She notes that the Bactrim product started at urgent care made her ill, so she stopped taking it.  She noted the abscess area in her right ear was very swollen and uncomfortable, and it did finally open up and drain a thicker yellowish material, and she has continued to apply warm compresses.  It is still intermittently painful, and at times she feels discomfort below the ear and sometimes into her right sinus region.  She denies any fevers, no postnasal drip or increased mucus, no big lumps or lymph nodes in her neck.  No neck rigidity.  No redness or rash has developed.  On her last follow-up visit addressing her blood pressure with [name] in December 2020, she was referred to cardiology at that point with the following noted:  she'd like to follow up with cardiology, reports history of "leaky valve", but is unsure of any  additional details.  Will refer her back to Dr. Chancy Milroy who she reports having seen about 4-5 years prior.She did not see cardiology after that visit.  HTN:  Medication regimen-amlodipine 63m daily and doing well, noted has been on that for years; allergy to lisinopril (weakness).  She does check her blood pressures at home, and notes they have been running in the  1270-350range systolic, and nineties diastolic She notes they have been running higher since she has been battling this abscess concern and having discomfort with that.  They had been good prior. BP Readings from Last 3 Encounters:  12/12/19 130/82  12/10/19 128/86  12/09/19 (!) 150/99   Last comp panel done about 6 months ago was entirely normal She has been anemic over the past months, followed by oncology, with her last counts 2 weeks ago showing her hemoglobin and hematocrit at 13.3 and 41.9. Denies chest pain, palpitations, shortness of breath, increased lower extremity swelling, vision changes.  She does have headaches presently. She noted she started Depo a month ago, and questioned if that could be an issue with her blood pressure as well.  Noted this as a possibility.  She also has a history of diabetes, hyperlipidemia, obesity, iron deficiency anemia, and anxiety.  She is currently on Ozempic for her diabetes, with her noting  today that her last A1c in June was 6.5.  Patient is an LPN, currently doing work from home for [employer].  Patient Active Problem List   Diagnosis Date Noted  . Family history of pancreatic cancer 10/10/2019  . Right ovarian cyst 10/10/2019  . Anxiety 03/05/2019  . Controlled type 2 diabetes mellitus with skin complication ([place]) 93/90/3009  . Palpable lymph node 08/17/2018  . Menorrhagia with regular cycle 08/14/2018  . Lymphadenopathy, axillary 10/27/2017  . Anemia 10/27/2017  . Chest pain 01/06/2016  . Essential hypertension 01/06/2016  . Mild intermittent asthma with acute exacerbation 01/06/2016   . Iron deficiency anemia 12/30/2015      Current Outpatient Medications:  .  albuterol (VENTOLIN HFA) 108 (90 Base) MCG/ACT inhaler, Inhale 2 puffs into the lungs every 6 (six) hours as needed for wheezing or shortness of breath., Disp: 8 g, Rfl: 0 .  amLODipine (NORVASC) 5 MG tablet, Take 1 tablet (5 mg total) by mouth daily., Disp: 90 tablet, Rfl: 3 .  blood glucose meter kit and supplies KIT, Dispense based on patient and insurance preference. Use up to four times daily as directed. (FOR ICD-9 250.00, 250.01)., Disp: 1 each, Rfl: 0 .  cetirizine (ZYRTEC) 10 MG tablet, Take 1 tablet (10 mg total) by mouth daily., Disp: 30 tablet, Rfl: 11 .  Fluticasone-Salmeterol (ADVAIR DISKUS) 100-50 MCG/DOSE AEPB, , Disp: , Rfl:  .  Insulin Pen Needle (NOVOFINE) 32G X 6 MM MISC, 1 each by Does not apply route once a week., Disp: 50 each, Rfl: 1 .  montelukast (SINGULAIR) 10 MG tablet, Take 10 mg by mouth at bedtime., Disp: , Rfl:  .  Semaglutide, 1 MG/DOSE, (OZEMPIC, 1 MG/DOSE,) 2 MG/1.5ML SOPN, Inject 1 mg into the skin once a week., Disp: 3 pen, Rfl: 3 .  medroxyPROGESTERone Acetate 150 MG/ML SUSY, Inject 1 mL (150 mg total) into the muscle once for 1 dose. Q 18month, Disp: 1 mL, Rfl: 3   Allergies  Allergen Reactions  . Lisinopril      Past Surgical History:  Procedure Laterality Date  . AXILLARY LYMPH NODE BIOPSY Right 05/25/2017   LYMPHADENITIS. NEGATIVE FOR MALIGNANCY.   .Marland KitchenBREAST BIOPSY Right 05/25/2017   neg/NEGATIVE FOR CARCINOMA. INFLAMMATION AND SQUAMOUS METAPLASIA OF A FOCAL DUCT   . CESAREAN SECTION     times 3  . TUBAL LIGATION       Family History  Problem Relation Age of Onset  . Diabetes Mother   . Heart failure Mother   . Kidney disease Mother   . Diabetes Maternal Grandmother   . Breast cancer Maternal Grandmother 660 . Hypertension Father   . Hyperlipidemia Father   . Atrial fibrillation Sister   . ADD / ADHD Son   . Heart attack Maternal Grandfather   .  Diabetes Paternal Grandmother   . Hypertension Paternal Grandmother   . Prostate cancer Paternal Grandfather   . Colon cancer Paternal Grandfather [phone] . Polycystic ovary syndrome Sister   . Polycystic ovary syndrome Sister   . Pancreatic cancer Paternal Aunt 636 . Ovarian cancer Neg Hx      Social History   Tobacco Use  . Smoking status: Never Smoker  . Smokeless tobacco: Never Used  Substance Use Topics  . Alcohol use: No    With staff assistance, above reviewed with the patient today.  ROS: As per HPI, otherwise no specific complaints on a limited and focused system review   No results found for this  or any previous visit (from the past 72 hour(s)).   PHQ2/9: Depression screen [facility] 2/9 12/12/2019 06/20/2019 03/05/2019 02/25/2019 01/29/2019  Decreased Interest 0 0 0 0 0  Down, Depressed, Hopeless 0 0 0 0 0  PHQ - 2 Score 0 0 0 0 0  Altered sleeping - 0 0 0 0  Tired, decreased energy - 0 0 0 0  Change in appetite - 0 0 0 0  Feeling bad or failure about yourself  - 0 0 0 0  Trouble concentrating - 0 0 0 0  Moving slowly or fidgety/restless - 0 0 0 0  Suicidal thoughts - 0 0 0 0  PHQ-9 Score - 0 0 0 0  Difficult doing work/chores - Not difficult at all - Not difficult at all Not difficult at all  Some recent data might be hidden   PHQ-2/9 Result is neg  Fall Risk: Fall Risk  12/12/2019 06/20/2019 03/05/2019 02/25/2019 01/29/2019  Falls in the past year? 0 0 0 0 0  Number falls in past yr: 0 0 0 0 0  Injury with Fall? 0 0 0 0 0  Follow up Falls evaluation completed - Falls evaluation completed Falls evaluation completed Falls evaluation completed      Objective:   Vitals:   12/12/19 0854  BP: 130/82  Pulse: 94  Resp: 16  Temp: 98.3 F (36.8 C)  TempSrc: Oral  SpO2: 99%  Weight: 206 lb 6.4 oz (93.6 kg)  Height: '5\' 2"'  (1.575 m)    Body mass index is 37.75 kg/m. Blood pressure checked by myself manually was 124/84 on the left with a large adult cuff Physical  Exam   NAD, masked, pleasant HEENT - [place]/AT, sclera anicteric, PERRL, EOMI, conj - non-inj'ed, nares patent with no focal sinus tenderness (noted some soreness in that right maxillary sinus region), a small raised lesion was present at the entrance of the right ear canal that was minimally erythematous, not draining, (patient noted was less in size than previous), with the canal distal to the lesion minimally erythematous, not marked and the TM clear with a good light reflex, pharynx clear Neck - supple, no marked adenopathy, question if the subauricular node on the right was slightly more pronounced than the left, although not marked, with no anterior cervical adenopathy, no rigidity Car - RRR without m/g/r, not tachycardic Pulm- RR and effort normal at rest, CTA without wheeze or rales Skin- no rash noted on exposed areas,  Neuro/psychiatric - affect was not flat, appropriate with conversation  Alert with normal speech  Results for orders placed or performed in visit on 11/26/19  CBC with Differential/Platelet  Result Value Ref Range   WBC 5.5 4.0 - 10.5 K/uL   RBC 5.43 (H) 3.87 - 5.11 MIL/uL   Hemoglobin 13.3 12.0 - 15.0 g/dL   HCT 41.9 36 - 46 %   MCV 77.2 (L) 80.0 - 100.0 fL   MCH 24.5 (L) 26.0 - 34.0 pg   MCHC 31.7 30.0 - 36.0 g/dL   RDW 16.7 (H) 11.5 - 15.5 %   Platelets 414 (H) 150 - 400 K/uL   nRBC 0.0 0.0 - 0.2 %   Neutrophils Relative % 60 %   Neutro Abs 3.3 1.7 - 7.7 K/uL   Lymphocytes Relative 28 %   Lymphs Abs 1.5 0.7 - 4.0 K/uL   Monocytes Relative 9 %   Monocytes Absolute 0.5 0 - 1 K/uL   Eosinophils Relative 3 %   Eosinophils Absolute  0.2 0 - 0 K/uL   Basophils Relative 0 %   Basophils Absolute 0.0 0 - 0 K/uL   Immature Granulocytes 0 %   Abs Immature Granulocytes 0.01 0.00 - 0.07 K/uL  Ferritin  Result Value Ref Range   Ferritin 14 11 - 307 ng/mL       Assessment & Plan:   1. Essential hypertension The patient's blood pressure has been slightly higher on  checks at home, and noted that her abscess symptoms, especially the discomfort with this may be a source of the elevation, as pain can increase blood pressure.  She noted before the ear abscess, her blood pressure had been well controlled with the low-dose amlodipine.  She has been having some headaches, and unclear how much that may be related to the ear abscess or possibly her elevated blood pressure. Blood pressures in the office today reasonable, and did take her blood pressure medicine this morning. Felt best not to increase her blood pressure medicine today, and continue to monitor. Recommended she continue to check her blood pressure about once daily as the ear symptoms continue to improve, and if the readings are above 720 systolic or above 90 diastolic with any regularity, she can increase the amlodipine to 2 tablets in the morning and is to let us know if she does increase the blood pressure medicine.  2. Abscess of ear canal, right It is okay to remain off of the Bactrim presently, and continue the warm compresses fairly liberally for the ear abscess concern. Also can use up to 3 tablets of ibuprofen as needed for discomfort, only in the very short-term and to take with food.  An alternative is the arthritis strength Tylenol to use as needed for discomfort noting her GI history (she noted that regular strength Tylenol has not been very helpful).   3. Controlled type 2 diabetes mellitus with other skin complication, without long-term current use of insulin ([place]) She noted her blood sugars have been better controlled, and she remains on the Ozempic presently.   She should follow-up if her symptoms are not further improving or again more problematic with respect to the ear abscess, and await her blood pressure response as her symptoms do improve.  If the blood pressure then remains higher, can increase the medicine as noted above, and will follow up at that time.     Towanda Malkin,  MD 12/12/19 9:01 AM

## 2019-12-12 NOTE — Patient Instructions (Signed)
It is okay to remain off of the Bactrim presently, and continue the warm compresses fairly liberally for the ear abscess concern.  Also can use up to 3 tablets of ibuprofen as needed for discomfort, take with food.  An alternative is the arthritis strength Tylenol to use as needed for discomfort noting your history.  Please continue to check your blood pressure about once daily as the ear symptoms continue to improve, and if the readings are above 239 systolic or above 90 diastolic with any regularity, you can increase the amlodipine to 2 tablets in the morning and please let us know if you do so.

## 2019-12-13 ENCOUNTER — Encounter: Payer: Self-pay | Admitting: Obstetrics and Gynecology

## 2019-12-18 NOTE — Telephone Encounter (Signed)
Needs hgb order with depo

## 2019-12-18 NOTE — Telephone Encounter (Signed)
Patient is scheduled 12/20/19

## 2019-12-18 NOTE — Telephone Encounter (Signed)
Patient is calling to schedule depo injection early. Please advise first date I can bring patient in for her Depo.

## 2019-12-18 NOTE — Telephone Encounter (Signed)
Bring in starting now. Also put "HgB check" on appt. Thx.

## 2019-12-19 ENCOUNTER — Ambulatory Visit: Payer: Managed Care, Other (non HMO)

## 2019-12-20 ENCOUNTER — Other Ambulatory Visit: Payer: Self-pay

## 2019-12-20 ENCOUNTER — Ambulatory Visit (INDEPENDENT_AMBULATORY_CARE_PROVIDER_SITE_OTHER): Payer: 59

## 2019-12-20 DIAGNOSIS — Z3042 Encounter for surveillance of injectable contraceptive: Secondary | ICD-10-CM

## 2019-12-20 DIAGNOSIS — D508 Other iron deficiency anemias: Secondary | ICD-10-CM

## 2019-12-20 LAB — POCT HEMOGLOBIN: Hemoglobin: 12.9 g/dL (ref 11–14.6)

## 2019-12-20 MED ORDER — MEDROXYPROGESTERONE ACETATE 150 MG/ML IM SUSP
150.0000 mg | Freq: Once | INTRAMUSCULAR | Status: AC
  Administered 2019-12-20: 150 mg via INTRAMUSCULAR

## 2019-12-20 NOTE — Progress Notes (Signed)
Pt here for depo which was given IM right hip.  NDC# [phone]  Pt also needed HGB ck'd. See results.

## 2019-12-30 ENCOUNTER — Encounter: Payer: Self-pay | Admitting: Internal Medicine

## 2019-12-30 ENCOUNTER — Encounter: Payer: Self-pay | Admitting: Obstetrics and Gynecology

## 2019-12-31 ENCOUNTER — Other Ambulatory Visit: Payer: Self-pay

## 2019-12-31 MED ORDER — AMLODIPINE BESYLATE 10 MG PO TABS: 10.0000 mg | ORAL_TABLET | Freq: Every day | ORAL | 3 refills | Status: DC

## 2019-12-31 NOTE — Telephone Encounter (Signed)
Hi Dr. Roxan Hockey My BP is still elevated. My ear is much better but the BP is 136/84-148/98. I started taking two pills just 2 days ago. Seems to be helping lower the pressure. Can you send a new RX for Amlodipine 10 [facility] to [facility] [address]?  Thank you    Patient sent message above via Mychat.

## 2020-01-07 LAB — HM DIABETES EYE EXAM

## 2020-01-22 ENCOUNTER — Other Ambulatory Visit: Payer: Self-pay

## 2020-01-22 MED ORDER — MONTELUKAST SODIUM 10 MG PO TABS: 10.0000 mg | ORAL_TABLET | Freq: Every day | ORAL | 1 refills | Status: DC

## 2020-01-30 NOTE — Progress Notes (Signed)
Patient ID: Donna Reyes, female    DOB: 01-15-1976, 44 y.o.   MRN: [id number]  PCP: Delsa Grana, PA-C  Chief Complaint  Patient presents with  . Back Pain    Subjective:   Donna Reyes is a 44 y.o. female, presents to clinic with CC of the following:  Chief Complaint  Patient presents with  . Back Pain    HPI:  Patient is a 44 year old female patient of Delsa Grana Last visit was with me 12/12/2019 with concerns for blood pressure and ear pain. She also has follow-up for controlled type 2 diabetes. Follows up today with back pain.  Patient notes the increasing back pain, felt very low down, more in the tailbone area for the past 2 weeks.  She noted it started about 6 months ago, after had been riding a bike more routinely, and was more low-grade, although in the past couple weeks, it has significantly increased.  No prior trauma noted before the increase. Noted when driving, it hurts as well as when getting out of the car or after prolonged sitting is very uncomfortable. Some difficulty sleeping due to the pain, and has to sleep on her side, not on her back. No pain radiates down the legs No numbness, tingling or marked weakness in LE's, no foot drop No bowel or bladder dysfunction symptoms, no saddle anesthesia symptoms No fevers or other infectious symptoms of concern Denies any abdominal pains, no change in bowel habits including no dark or black stools, no bleeding per rectum. Has tried ibuprofen 1 time to help, did not note much relief.  Denied problems taking NSAIDs in her past.  No cancer history, and she noted she had genetic screens done through gynecology and were all okay. + diabetes history She is for me today there is no chance she is pregnant.   Patient Active Problem List   Diagnosis Date Noted  . Abscess of ear canal, right 12/12/2019  . Family history of pancreatic cancer 10/10/2019  . Right ovarian cyst 10/10/2019  . Anxiety  03/05/2019  . Controlled type 2 diabetes mellitus with skin complication ([place]) 34/28/7681  . Palpable lymph node 08/17/2018  . Menorrhagia with regular cycle 08/14/2018  . Lymphadenopathy, axillary 10/27/2017  . Anemia 10/27/2017  . Chest pain 01/06/2016  . Essential hypertension 01/06/2016  . Mild intermittent asthma with acute exacerbation 01/06/2016  . Iron deficiency anemia 12/30/2015      Current Outpatient Medications:  .  albuterol (VENTOLIN HFA) 108 (90 Base) MCG/ACT inhaler, Inhale 2 puffs into the lungs every 6 (six) hours as needed for wheezing or shortness of breath., Disp: 8 g, Rfl: 0 .  amLODipine (NORVASC) 10 MG tablet, Take 1 tablet (10 mg total) by mouth daily., Disp: 90 tablet, Rfl: 3 .  blood glucose meter kit and supplies KIT, Dispense based on patient and insurance preference. Use up to four times daily as directed. (FOR ICD-9 250.00, 250.01)., Disp: 1 each, Rfl: 0 .  Fluticasone-Salmeterol (ADVAIR DISKUS) 100-50 MCG/DOSE AEPB, , Disp: , Rfl:  .  Insulin Pen Needle (NOVOFINE) 32G X 6 MM MISC, 1 each by Does not apply route once a week., Disp: 50 each, Rfl: 1 .  montelukast (SINGULAIR) 10 MG tablet, Take 1 tablet (10 mg total) by mouth at bedtime., Disp: 90 tablet, Rfl: 1 .  Semaglutide,0.25 or 0.5MG/DOS, (OZEMPIC, 0.25 OR 0.5 MG/DOSE,) 2 MG/1.5ML SOPN, , Disp: , Rfl:  .  medroxyPROGESTERone Acetate 150 MG/ML SUSY, Inject 1 mL (150 mg  total) into the muscle once for 1 dose. Q 41month, Disp: 1 mL, Rfl: 3   Allergies  Allergen Reactions  . Lisinopril      Past Surgical History:  Procedure Laterality Date  . AXILLARY LYMPH NODE BIOPSY Right 05/25/2017   LYMPHADENITIS. NEGATIVE FOR MALIGNANCY.   .Marland KitchenBREAST BIOPSY Right 05/25/2017   neg/NEGATIVE FOR CARCINOMA. INFLAMMATION AND SQUAMOUS METAPLASIA OF A FOCAL DUCT   . CESAREAN SECTION     times 3  . TUBAL LIGATION       Family History  Problem Relation Age of Onset  . Diabetes Mother   . Heart failure Mother    . Kidney disease Mother   . Diabetes Maternal Grandmother   . Breast cancer Maternal Grandmother 650 . Hypertension Father   . Hyperlipidemia Father   . Atrial fibrillation Sister   . ADD / ADHD Son   . Heart attack Maternal Grandfather   . Diabetes Paternal Grandmother   . Hypertension Paternal Grandmother   . Prostate cancer Paternal Grandfather   . Colon cancer Paternal Grandfather 654 . Polycystic ovary syndrome Sister   . Polycystic ovary syndrome Sister   . Pancreatic cancer Paternal Aunt 63 . Ovarian cancer Neg Hx      Social History   Tobacco Use  . Smoking status: Never Smoker  . Smokeless tobacco: Never Used  Substance Use Topics  . Alcohol use: No    With staff assistance, above reviewed with the patient today.  ROS: As per HPI, otherwise no specific complaints on a limited and focused system review   No results found for this or any previous visit (from the past 72 hour(s)).   PH[facility]2/9: [facility] 02/04/2020 12/12/2019 06/20/2019 03/05/2019 02/25/2019  Decreased Interest 2 0 0 0 0  Down, Depressed, Hopeless 1 0 0 0 0  PHQ - 2 Score 3 0 0 0 0  Altered sleeping 1 - 0 0 0  Tired, decreased energy 2 - 0 0 0  Change in appetite 1 - 0 0 0  Feeling bad or failure about yourself  1 - 0 0 0  Trouble concentrating 1 - 0 0 0  Moving slowly or fidgety/restless 0 - 0 0 0  Suicidal thoughts 0 - 0 0 0  PHQ-9 Score - - 0 0 0  Difficult doing work/chores Somewhat difficult - Not difficult at all - Not difficult at all  Some recent data might be hidden   PHQ-2/9 Result reviewed  Fall Risk: Fall Risk  02/04/2020 12/12/2019 06/20/2019 03/05/2019 02/25/2019  Falls in the past year? 0 0 0 0 0  Number falls in past yr: 0 0 0 0 0  Injury with Fall? 0 0 0 0 0  Follow up - Falls evaluation completed - Falls evaluation completed Falls evaluation completed      Objective:   Vitals:   02/04/20 0931  BP: 120/80  Pulse: 82  Resp: 16  Temp: 98.2 F (36.8 C)   TempSrc: Oral  SpO2: 100%  Weight: 200 lb 12.8 oz (91.1 kg)  Height: '5\' 2"'  (1.575 m)    Body mass index is 36.73 kg/m.  Physical Exam   NAD, masked, pleasant HEENT - [place]/AT, sclera anicteric,  Car - RRR, not tachycardic Pulm- RR and effort normal at rest,  Abd - soft, NT, ND, BS+,  no masses Back - no CVA tenderness.  Could bend to touch her toes, and reach about toe level with some mild discomfort  at the extremes, mildly tender palpating in the sacral/upper coccyx area, and minimally tender in the area surrounding bilateral,  nontender palpating in the lumbar spine, nontender at the SI joints, Straight leg raise negative, No pain with touching knees to her chest while supine Ext - no marked LE edema Neuro/psychiatric - affect was not flat, appropriate with conversation  Alert and oriented  Grossly non-focal, DTRs 2+ and equal in the patella and Achilles, good strength on testing lower extremities, sensation intact to LT in distal lower extremities  Speech normal   Was able to get up from the chair and up the step onto the exam table without assistance  Results for orders placed or performed in visit on 01/08/20  HM DIABETES EYE EXAM  Result Value Ref Range   HM Diabetic Eye Exam No Retinopathy No Retinopathy       Assessment & Plan:   1. Need for immunization against influenza  - Flu Vaccine QUAD 36+ mos IM  2. Acute midline low back pain without sciatica There was no trauma prior to the increase noted, and notes this is more of an acute pain on top of a more chronic pain over the past 6 months.  Exact source unclear, although discussed obtaining an x-ray, noting her diabetes history, and she very much wanted to get an x-ray today.  That was ordered.  Also feel managing for inflammation concerns is important, and the following recommended Will use a meloxicam product-take once daily with food in the short-term as needed Also will add a Flexeril product at bedtime to help  with sleep issues, and can help with spasm, and did warn may make drowsy and to take mainly at bedtime as ordered Recommended cold to the area topically, at 10 to 15-minute intervals rather liberally in the near future, and do feel cold is better than heat presently Recommended staying active, as tolerated. Discussed potentially having physical therapy involvement over time pending her status, and also await the x-ray result presently. - DG Sacrum/Coccyx; Future - cyclobenzaprine (FLEXERIL) 5 MG tablet; Take 1 tablet (5 mg total) by mouth at bedtime. Take as needed, May increase to two tabs at bedtime pending response  Dispense: 30 tablet; Refill: 2 - meloxicam (MOBIC) 15 MG tablet; Take 1 tablet (15 mg total) by mouth daily. Take as needed with food  Dispense: 30 tablet; Refill: 1  3. Controlled type 2 diabetes mellitus with other skin complication, without long-term current use of insulin ([place]) She does have a history of this, and will get an x-ray today - DG Sacrum/Coccyx; Future  4. Obesity due to excess calories with serious comorbidity, unspecified classification  Await x-ray result, and she is aware I will not have that likely until tomorrow, and if concerns, will address at that time.  Await response to the above as well and will follow-up if symptoms not improving or more problematic over time as we discussed today.        Towanda Malkin, MD 02/04/20 10:07 AM

## 2020-02-04 ENCOUNTER — Encounter: Payer: Self-pay | Admitting: Internal Medicine

## 2020-02-04 ENCOUNTER — Ambulatory Visit
Admission: RE | Admit: 2020-02-04 | Discharge: 2020-02-04 | Disposition: A | Discharge status: Home / Self Care | Payer: 59 | Attending: Internal Medicine | Admitting: Internal Medicine

## 2020-02-04 ENCOUNTER — Ambulatory Visit
Admission: RE | Admit: 2020-02-04 | Discharge: 2020-02-04 | Disposition: A | Discharge status: Home / Self Care | Payer: 59 | Source: Ambulatory Visit | Attending: Internal Medicine | Admitting: Internal Medicine

## 2020-02-04 ENCOUNTER — Ambulatory Visit (INDEPENDENT_AMBULATORY_CARE_PROVIDER_SITE_OTHER): Payer: 59 | Admitting: Internal Medicine

## 2020-02-04 ENCOUNTER — Other Ambulatory Visit: Payer: Self-pay

## 2020-02-04 VITALS — BP 120/80 | HR 82 | Temp 98.2°F | Resp 16 | Ht 62.0 in | Wt 200.8 lb

## 2020-02-04 DIAGNOSIS — M545 Low back pain, unspecified: Secondary | ICD-10-CM

## 2020-02-04 DIAGNOSIS — E6609 Other obesity due to excess calories: Secondary | ICD-10-CM

## 2020-02-04 DIAGNOSIS — M533 Sacrococcygeal disorders, not elsewhere classified: Secondary | ICD-10-CM | POA: Diagnosis not present

## 2020-02-04 DIAGNOSIS — E11628 Type 2 diabetes mellitus with other skin complications: Secondary | ICD-10-CM

## 2020-02-04 DIAGNOSIS — Z23 Encounter for immunization: Secondary | ICD-10-CM | POA: Diagnosis not present

## 2020-02-04 MED ORDER — MELOXICAM 15 MG PO TABS: 15.0000 mg | ORAL_TABLET | Freq: Every day | ORAL | 1 refills | Status: DC

## 2020-02-04 MED ORDER — CYCLOBENZAPRINE HCL 5 MG PO TABS: 5.0000 mg | ORAL_TABLET | Freq: Every day | ORAL | 2 refills | Status: DC

## 2020-02-04 NOTE — Patient Instructions (Signed)
Please obtain the x-ray that was ordered today.  Can take the meloxicam product once daily as needed, take with food  Also can take the Flexeril product at bedtime as needed, may make drowsy and take at bedtime recommended.  Also apply cold to the area at 10 to 15-minute intervals fairly liberally, and staying active important.

## 2020-02-09 ENCOUNTER — Encounter: Payer: Self-pay | Admitting: Internal Medicine

## 2020-02-10 ENCOUNTER — Other Ambulatory Visit: Payer: Self-pay | Admitting: Internal Medicine

## 2020-02-10 DIAGNOSIS — I10 Essential (primary) hypertension: Secondary | ICD-10-CM

## 2020-02-10 MED ORDER — LOSARTAN POTASSIUM 50 MG PO TABS: 50.0000 mg | ORAL_TABLET | Freq: Every day | ORAL | 1 refills | Status: DC

## 2020-02-10 NOTE — Progress Notes (Signed)
Patient sent a message noting her lower extremity swelling has increased, with the amlodipine product felt to be a possible contributor.  She asked about changing that medicine to control her blood pressure, and I think that is reasonable. Changed to a losartan product-50 mg daily, and did note she had a prior allergy to lisinopril, with that being weakness noted.  Do feel using a losartan product is reasonable presently. Await her response. Did ask her to schedule follow-up in 3 to 4 weeks time to reassess on the losartan, and also to continue checking her blood pressures at home as well.

## 2020-02-16 ENCOUNTER — Encounter: Payer: Self-pay | Admitting: Obstetrics and Gynecology

## 2020-02-17 ENCOUNTER — Other Ambulatory Visit: Payer: Self-pay | Admitting: Obstetrics and Gynecology

## 2020-02-17 DIAGNOSIS — Z30013 Encounter for initial prescription of injectable contraceptive: Secondary | ICD-10-CM

## 2020-02-17 MED ORDER — MEDROXYPROGESTERONE ACETATE 150 MG/ML IM SUSY: 150.0000 mg | PREFILLED_SYRINGE | Freq: Once | INTRAMUSCULAR | 1 refills | Status: DC

## 2020-02-17 NOTE — Progress Notes (Signed)
Rx depo change to Q9 wks due to BTB/hx of menorrhagia

## 2020-02-17 NOTE — Telephone Encounter (Signed)
Pls call pharmacy to see if we can change depo Rx to Q9 wks (instead of 3 months) so pt can fill sooner. If I need to send in new Rx, I can. Just let me know. Pls then f/u with pt. Thx.

## 2020-02-18 ENCOUNTER — Other Ambulatory Visit: Payer: Self-pay

## 2020-02-18 ENCOUNTER — Ambulatory Visit (INDEPENDENT_AMBULATORY_CARE_PROVIDER_SITE_OTHER): Payer: 59

## 2020-02-18 DIAGNOSIS — Z3042 Encounter for surveillance of injectable contraceptive: Secondary | ICD-10-CM

## 2020-02-18 MED ORDER — MEDROXYPROGESTERONE ACETATE 150 MG/ML IM SUSP
150.0000 mg | Freq: Once | INTRAMUSCULAR | Status: AC
Start: 2020-02-18 — End: 2020-02-18
  Administered 2020-02-18: 150 mg via INTRAMUSCULAR

## 2020-02-18 NOTE — Progress Notes (Signed)
Patient presents today for Depo Provera injection within dates. Given IM LUOQ. Patient tolerated well. 

## 2020-03-02 ENCOUNTER — Other Ambulatory Visit: Payer: Self-pay

## 2020-03-02 DIAGNOSIS — D508 Other iron deficiency anemias: Secondary | ICD-10-CM

## 2020-03-02 DIAGNOSIS — N920 Excessive and frequent menstruation with regular cycle: Secondary | ICD-10-CM

## 2020-03-02 DIAGNOSIS — D500 Iron deficiency anemia secondary to blood loss (chronic): Secondary | ICD-10-CM

## 2020-03-03 ENCOUNTER — Inpatient Hospital Stay: Payer: 59 | Attending: Hematology and Oncology

## 2020-03-03 ENCOUNTER — Other Ambulatory Visit: Payer: Self-pay

## 2020-03-03 DIAGNOSIS — D509 Iron deficiency anemia, unspecified: Secondary | ICD-10-CM | POA: Diagnosis not present

## 2020-03-03 DIAGNOSIS — N920 Excessive and frequent menstruation with regular cycle: Secondary | ICD-10-CM

## 2020-03-03 DIAGNOSIS — D500 Iron deficiency anemia secondary to blood loss (chronic): Secondary | ICD-10-CM

## 2020-03-03 LAB — CBC WITH DIFFERENTIAL/PLATELET
Abs Immature Granulocytes: 0.02 10*3/uL (ref 0.00–0.07)
Basophils Absolute: 0 10*3/uL (ref 0.0–0.1)
Basophils Relative: 1 %
Eosinophils Absolute: 0.2 10*3/uL (ref 0.0–0.5)
Eosinophils Relative: 4 %
HCT: 38.4 % (ref 36.0–46.0)
Hemoglobin: 12.7 g/dL (ref 12.0–15.0)
Immature Granulocytes: 0 %
Lymphocytes Relative: 33 %
Lymphs Abs: 1.8 10*3/uL (ref 0.7–4.0)
MCH: 26.7 pg (ref 26.0–34.0)
MCHC: 33.1 g/dL (ref 30.0–36.0)
MCV: 80.7 fL (ref 80.0–100.0)
Monocytes Absolute: 0.4 10*3/uL (ref 0.1–1.0)
Monocytes Relative: 8 %
Neutro Abs: 2.9 10*3/uL (ref 1.7–7.7)
Neutrophils Relative %: 54 %
Platelets: 417 10*3/uL — ABNORMAL HIGH (ref 150–400)
RBC: 4.76 MIL/uL (ref 3.87–5.11)
RDW: 14.7 % (ref 11.5–15.5)
WBC: 5.3 10*3/uL (ref 4.0–10.5)
nRBC: 0 % (ref 0.0–0.2)

## 2020-03-03 LAB — FERRITIN: Ferritin: 10 ng/mL — ABNORMAL LOW (ref 11–307)

## 2020-03-04 ENCOUNTER — Encounter: Payer: Self-pay | Admitting: Obstetrics and Gynecology

## 2020-03-04 ENCOUNTER — Telehealth: Payer: Self-pay

## 2020-03-04 NOTE — Telephone Encounter (Signed)
Left message for patient to call back  

## 2020-03-04 NOTE — Telephone Encounter (Signed)
-----   Message from Lequita Asal, MD sent at 03/04/2020  9:55 AM EST ----- Regarding: Please call patient  Ferritin 10.  Hemoglobin 12.7 (normal).  Consider Venofer weekly x 2.  M ----- Message ----- From: Buel Ream, Lab In [place] Sent: 03/03/2020   2:26 PM EST To: Lequita Asal, MD

## 2020-03-09 NOTE — Progress Notes (Signed)
Patient ID: Donna Reyes, female    DOB: 10-20-1975, 44 y.o.   MRN: [id number]  PCP: Delsa Grana, PA-C  Chief Complaint  Patient presents with  . Follow-up    Subjective:   Donna Reyes is a 44 y.o. female, presents to clinic with CC of the following:  Chief Complaint  Patient presents with  . Follow-up    HPI:  Patient is a 44 year old female patient of Delsa Grana Last visit with me was 02/04/2020 I did change her blood pressure medication since that last visit. Follows up today.  Has been recently following with OB/GYN for bleeding after starting her Depo.  They noted if still bleeding after the third shot, possibly considering something else.  CBC has remained stable. Lab Results  Component Value Date   WBC 5.3 03/03/2020   HGB 12.7 03/03/2020   HCT 38.4 03/03/2020   MCV 80.7 03/03/2020   PLT 417 (H) 03/03/2020   Getting an iron infusion today as ferritin was still very low.  Sees Dr. Mike Gip in [facility] to have this done.  Message received after our last visit was as follows:  have been experiencing pedal edema since increasing my amlodipine 10 mg. I forgot to mention it at my last appointment. It is controlling my BP but the swelling can get bad. Should we change the medication to something else? The following was recommended.  Please let the patient know that I do think it is a good idea to change the medication, and prescribed losartan-50 mg daily to take in place of the amlodipine.  I did note she has an allergy to lisinopril, and that noted to be weakness, and do feel losartan is a good medicine to try presently.  Please schedule her a follow-up visit with me in about 4 weeks time to help reassess on the losartan, and also have her continue to check her blood pressures at home as well to help.   HTN: Medication regimen - losartan-50 mg daily (allergy to lisinopril in her past noted (weakness), has been tolerating the medicine  since started She does check her blood pressures at home, and notes they have been running in the  453'M range systolic, and 46'O diastolic  BP Readings from Last 3 Encounters:  03/10/20 124/84  02/04/20 120/80  12/12/19 130/82    Denies chest pain, palpitations, shortness of breath, increased lower extremity swelling, vision changes. No HA's  Diabetes mellitustype 2:hadgestational, then this continued into the postpartum period 13 years ago. Checking sugars?yes How often?1-2 times a week Range (low to high) over last two weeks - Fasting in 90's, afternoon checks 130's.   Last eye exam:Had in [place] does annually  Denies: Polyuria, polydipsia,   Lab Results  Component Value Date   HGBA1C 6.2 11/30/2018   HGBA1C 6.5 (H) 08/24/2018   Lab Results  Component Value Date   MICROALBUR 1.4 08/24/2018   LDLCALC 153 (H) 08/24/2018   CREATININE 0.60 05/22/2019   A1c was 6.5 at clinic in [facility] in June 2021  Current Medication Management: Diabetic Medications:Ozempic - she is on 0.5 mg weekly,  ACEI/ARB: -  has allergy to lisinopril (had weakness, no angioedema), on ARB Statin:No- cannot tolerate statin noted previously  Hyperlipidemia: She has taken statin medication in the past and it possibly caused leg cramping  Not adamant in not trying again after discussed risk/benefits, as I do think it is a great medication for her to be on, especially  noting her diabetic issues.  She agreed to try taking again.  She notes she was on Lipitor prior, and will change to Crestor presently.   Lab Results  Component Value Date   CHOL 211 (H) 08/24/2018   HDL 40 (L) 08/24/2018   LDLCALC 153 (H) 08/24/2018   TRIG 77 08/24/2018   CHOLHDL 5.3 (H) 08/24/2018    Weight Gain/Obesity: Wt Readings from Last 3 Encounters:  03/10/20 210 lb 14.4 oz (95.7 kg)  02/04/20 200 lb 12.8 oz (91.1 kg)  12/12/19 206 lb 6.4 oz (93.6 kg)    She hadgained about 50-60lbs in 2 years since  being taken off of Metformin. Was 172lbs. Taking Ozempic and doing well overall on this medication, with weight in the low 200s in the recent past   She is trying to eat healthier lately,  Not exercising as much since colder, and walking outside sometimes can exacerbate asthma symptoms.  Encouraged getting more active again.  Patient is an LPN, currently doing work from home for [employer].    Patient Active Problem List   Diagnosis Date Noted  . Abscess of ear canal, right 12/12/2019  . Family history of pancreatic cancer 10/10/2019  . Right ovarian cyst 10/10/2019  . Anxiety 03/05/2019  . Controlled type 2 diabetes mellitus with skin complication ([place]) 20/94/7096  . Palpable lymph node 08/17/2018  . Menorrhagia with regular cycle 08/14/2018  . Lymphadenopathy, axillary 10/27/2017  . Anemia 10/27/2017  . Chest pain 01/06/2016  . Essential hypertension 01/06/2016  . Mild intermittent asthma with acute exacerbation 01/06/2016  . Iron deficiency anemia 12/30/2015      Current Outpatient Medications:  .  albuterol (VENTOLIN HFA) 108 (90 Base) MCG/ACT inhaler, Inhale 2 puffs into the lungs every 6 (six) hours as needed for wheezing or shortness of breath., Disp: 8 g, Rfl: 0 .  blood glucose meter kit and supplies KIT, Dispense based on patient and insurance preference. Use up to four times daily as directed. (FOR ICD-9 250.00, 250.01)., Disp: 1 each, Rfl: 0 .  cyclobenzaprine (FLEXERIL) 5 MG tablet, Take 1 tablet (5 mg total) by mouth at bedtime. Take as needed, May increase to two tabs at bedtime pending response, Disp: 30 tablet, Rfl: 2 .  Insulin Pen Needle (NOVOFINE) 32G X 6 MM MISC, 1 each by Does not apply route once a week., Disp: 50 each, Rfl: 1 .  losartan (COZAAR) 50 MG tablet, Take 1 tablet (50 mg total) by mouth daily., Disp: 90 tablet, Rfl: 1 .  medroxyPROGESTERone Acetate 150 MG/ML SUSY, Inject 1 mL (150 mg total) into the muscle once for 1 dose. Q 9-12 wks, Disp: 1 mL, Rfl:  1 .  meloxicam (MOBIC) 15 MG tablet, Take 1 tablet (15 mg total) by mouth daily. Take as needed with food, Disp: 30 tablet, Rfl: 1 .  rosuvastatin (CRESTOR) 10 MG tablet, Take 1 tablet (10 mg total) by mouth daily., Disp: 90 tablet, Rfl: 1 .  Semaglutide,0.25 or 0.5MG/DOS, (OZEMPIC, 0.25 OR 0.5 MG/DOSE,) 2 MG/1.5ML SOPN, Inject 1 mg into the skin once a week., Disp: 9 mL, Rfl: 1   Allergies  Allergen Reactions  . Lisinopril      Past Surgical History:  Procedure Laterality Date  . AXILLARY LYMPH NODE BIOPSY Right 05/25/2017   LYMPHADENITIS. NEGATIVE FOR MALIGNANCY.   Marland Kitchen BREAST BIOPSY Right 05/25/2017   neg/NEGATIVE FOR CARCINOMA. INFLAMMATION AND SQUAMOUS METAPLASIA OF A FOCAL DUCT   . CESAREAN SECTION     times 3  .  TUBAL LIGATION       Family History  Problem Relation Age of Onset  . Diabetes Mother   . Heart failure Mother   . Kidney disease Mother   . Diabetes Maternal Grandmother   . Breast cancer Maternal Grandmother 20  . Hypertension Father   . Hyperlipidemia Father   . Atrial fibrillation Sister   . ADD / ADHD Son   . Heart attack Maternal Grandfather   . Diabetes Paternal Grandmother   . Hypertension Paternal Grandmother   . Prostate cancer Paternal Grandfather   . Colon cancer Paternal Grandfather 78  . Polycystic ovary syndrome Sister   . Polycystic ovary syndrome Sister   . Pancreatic cancer Paternal Aunt 81  . Ovarian cancer Neg Hx      Social History   Tobacco Use  . Smoking status: Never Smoker  . Smokeless tobacco: Never Used  Substance Use Topics  . Alcohol use: No    With staff assistance, above reviewed with the patient today.  ROS: As per HPI, otherwise no specific complaints on a limited and focused system review   No results found for this or any previous visit (from the past 72 hour(s)).   PHQ2/9: Depression screen [facility] 2/9 02/04/2020 12/12/2019 06/20/2019 03/05/2019 02/25/2019  Decreased Interest 2 0 0 0 0  Down, Depressed, Hopeless  1 0 0 0 0  PHQ - 2 Score 3 0 0 0 0  Altered sleeping 1 - 0 0 0  Tired, decreased energy 2 - 0 0 0  Change in appetite 1 - 0 0 0  Feeling bad or failure about yourself  1 - 0 0 0  Trouble concentrating 1 - 0 0 0  Moving slowly or fidgety/restless 0 - 0 0 0  Suicidal thoughts 0 - 0 0 0  PHQ-9 Score - - 0 0 0  Difficult doing work/chores Somewhat difficult - Not difficult at all - Not difficult at all  Some recent data might be hidden    Fall Risk: Fall Risk  02/04/2020 12/12/2019 06/20/2019 03/05/2019 02/25/2019  Falls in the past year? 0 0 0 0 0  Number falls in past yr: 0 0 0 0 0  Injury with Fall? 0 0 0 0 0  Follow up - Falls evaluation completed - Falls evaluation completed Falls evaluation completed      Objective:   Vitals:   03/10/20 1022  BP: 124/84  Pulse: 92  Resp: 16  Temp: 98.3 F (36.8 C)  TempSrc: Oral  SpO2: 97%  Weight: 210 lb 14.4 oz (95.7 kg)  Height: '5\' 2"'  (1.575 m)    Body mass index is 38.57 kg/m.  Physical Exam    NAD, masked, pleasant HEENT - [place]/AT, sclera anicteric, PERRL, EOMI, conj - non-inj'ed,  pharynx clear Neck - supple, no adenopathy, no TM, carotids 2+ and equal without bruits Car - RRR without m/g/r,  Pulm- RR and effort normal at rest, CTA without wheeze or rales Abdomen-mildly obese, soft, nontender diffusely, nondistended Back-no CVA tenderness Neuro/psychiatric - affect was not flat, appropriate with conversation             Alert with normal speech  Grossly nonfocal  Results for orders placed or performed in visit on 03/03/20  Ferritin  Result Value Ref Range   Ferritin 10 (L) 11 - 307 ng/mL  CBC with Differential  Result Value Ref Range   WBC 5.3 4.0 - 10.5 K/uL   RBC 4.76 3.87 - 5.11 MIL/uL  Hemoglobin 12.7 12.0 - 15.0 g/dL   HCT 38.4 36 - 46 %   MCV 80.7 80.0 - 100.0 fL   MCH 26.7 26.0 - 34.0 pg   MCHC 33.1 30.0 - 36.0 g/dL   RDW 14.7 11.5 - 15.5 %   Platelets 417 (H) 150 - 400 K/uL   nRBC 0.0 0.0 - 0.2 %    Neutrophils Relative % 54 %   Neutro Abs 2.9 1.7 - 7.7 K/uL   Lymphocytes Relative 33 %   Lymphs Abs 1.8 0.7 - 4.0 K/uL   Monocytes Relative 8 %   Monocytes Absolute 0.4 0.1 - 1.0 K/uL   Eosinophils Relative 4 %   Eosinophils Absolute 0.2 0.0 - 0.5 K/uL   Basophils Relative 1 %   Basophils Absolute 0.0 0.0 - 0.1 K/uL   Immature Granulocytes 0 %   Abs Immature Granulocytes 0.02 0.00 - 0.07 K/uL   Recent labs reviewed    Assessment & Plan:    1. Essential hypertension Blood pressure is well controlled on the losartan product-50 mg daily Noted the benefits of the ARB with respect to her kidney function as well We will check labs today and continue the losartan daily - COMPLETE METABOLIC PANEL WITH GFR  2. Controlled type 2 diabetes mellitus with other skin complication, without long-term current use of insulin (HCC) Last A1c was 6.5 in June per her history, and currently on Ozempic-0.5 mg daily We will recheck A1c today as well as the urine for microalbumin Also check a metabolic panel. Has yearly eye exams, with her last in September and okay to continue annual exams. Discussed possibly increasing her Ozempic, as would help for weight loss perspective, likely help with better control of blood sugars over time.  She was in agreement with this. Increase the Ozempic to 1 mg weekly. - Hemoglobin A1c - Microalbumin / creatinine urine ratio - COMPLETE METABOLIC PANEL WITH GFR  3. Hyperlipidemia associated with type 2 diabetes mellitus ([place]) Discussed the benefits of a statin product to help with her hyperlipidemia, and reviewed the last lipid panel. She agreed to trying a statin product again, and will use Crestor-10 mg daily (as noted previously was on Lipitor when she has some mild side effect concerns) Recheck a lipid panel today as well. - COMPLETE METABOLIC PANEL WITH GFR - Lipid panel  4. Obesity due to excess calories with serious comorbidity, unspecified  classification Discussed the importance of healthy weight maintenance, and she agreed to increase the Ozempic to 1 mg daily as do feel will be more helpful with weight loss issues. Also the importance of increasing aerobic activity again noted and also dietary modifications important in combination to help with weight loss Continue to monitor  5. Iron deficiency anemia due to chronic blood loss Has an iron infusion planned for today, and continues to be followed with OB/GYN as well due to the menorrhagia concerns with the Depo product.  She noted it has improved some as she has continued the Depo.   6. Menorrhagia with regular cycle As above.  Await lab results presently, and tentatively will schedule follow-up again in 4 months time, sooner as needed. She is aware of the follow-up will be with a different provider as I am leaving this practice sooner than that planned follow-up visit.     Towanda Malkin, MD 03/10/20 11:20 AM

## 2020-03-10 ENCOUNTER — Ambulatory Visit: Payer: 59 | Admitting: Internal Medicine

## 2020-03-10 ENCOUNTER — Ambulatory Visit (INDEPENDENT_AMBULATORY_CARE_PROVIDER_SITE_OTHER): Payer: 59 | Admitting: Internal Medicine

## 2020-03-10 ENCOUNTER — Ambulatory Visit: Payer: Managed Care, Other (non HMO)

## 2020-03-10 ENCOUNTER — Other Ambulatory Visit: Payer: Self-pay

## 2020-03-10 ENCOUNTER — Inpatient Hospital Stay: Payer: 59 | Attending: Hematology and Oncology

## 2020-03-10 VITALS — BP 122/84 | HR 86 | Resp 18

## 2020-03-10 VITALS — BP 124/84 | HR 92 | Temp 98.3°F | Resp 16 | Ht 62.0 in | Wt 210.9 lb

## 2020-03-10 DIAGNOSIS — E119 Type 2 diabetes mellitus without complications: Secondary | ICD-10-CM | POA: Diagnosis not present

## 2020-03-10 DIAGNOSIS — D508 Other iron deficiency anemias: Secondary | ICD-10-CM | POA: Insufficient documentation

## 2020-03-10 DIAGNOSIS — E785 Hyperlipidemia, unspecified: Secondary | ICD-10-CM

## 2020-03-10 DIAGNOSIS — I10 Essential (primary) hypertension: Secondary | ICD-10-CM | POA: Insufficient documentation

## 2020-03-10 DIAGNOSIS — E11628 Type 2 diabetes mellitus with other skin complications: Secondary | ICD-10-CM

## 2020-03-10 DIAGNOSIS — D500 Iron deficiency anemia secondary to blood loss (chronic): Secondary | ICD-10-CM

## 2020-03-10 DIAGNOSIS — G4730 Sleep apnea, unspecified: Secondary | ICD-10-CM | POA: Insufficient documentation

## 2020-03-10 DIAGNOSIS — N920 Excessive and frequent menstruation with regular cycle: Secondary | ICD-10-CM | POA: Diagnosis not present

## 2020-03-10 DIAGNOSIS — E1169 Type 2 diabetes mellitus with other specified complication: Secondary | ICD-10-CM

## 2020-03-10 DIAGNOSIS — Z79899 Other long term (current) drug therapy: Secondary | ICD-10-CM | POA: Diagnosis not present

## 2020-03-10 DIAGNOSIS — E6609 Other obesity due to excess calories: Secondary | ICD-10-CM | POA: Diagnosis not present

## 2020-03-10 MED ORDER — OZEMPIC (0.25 OR 0.5 MG/DOSE) 2 MG/1.5ML ~~LOC~~ SOPN: 1.0000 mg | PEN_INJECTOR | SUBCUTANEOUS | 1 refills | Status: DC

## 2020-03-10 MED ORDER — ROSUVASTATIN CALCIUM 10 MG PO TABS: 10.0000 mg | ORAL_TABLET | Freq: Every day | ORAL | 1 refills | Status: DC

## 2020-03-10 MED ORDER — SODIUM CHLORIDE 0.9 % IV SOLN: 200.0000 mg | Freq: Once | INTRAVENOUS | Status: DC

## 2020-03-10 MED ORDER — IRON SUCROSE 20 MG/ML IV SOLN
200.0000 mg | Freq: Once | INTRAVENOUS | Status: AC
  Administered 2020-03-10: 200 mg via INTRAVENOUS
  Filled 2020-03-10: qty 10

## 2020-03-10 MED ORDER — SODIUM CHLORIDE 0.9 % IV SOLN
INTRAVENOUS | Status: DC
  Filled 2020-03-10: qty 250

## 2020-03-10 NOTE — Progress Notes (Signed)
Pt declines urine preg. Test since she has had a bilateral tubal ligation and is currently on depo shots. She tolerated her venofer tx well, vss, d/ced home

## 2020-03-11 ENCOUNTER — Other Ambulatory Visit: Payer: Self-pay

## 2020-03-11 LAB — HEMOGLOBIN A1C
Hgb A1c MFr Bld: 6.7 % of total Hgb — ABNORMAL HIGH (ref <5.7)
Mean Plasma Glucose: 146 mg/dL
eAG (mmol/L): 8.1 mmol/L

## 2020-03-11 LAB — LIPID PANEL
Cholesterol: 187 mg/dL (ref <200)
HDL: 38 mg/dL — ABNORMAL LOW (ref >=50)
LDL Cholesterol (Calc): 125 mg/dL (calc) — ABNORMAL HIGH
Non-HDL Cholesterol (Calc): 149 mg/dL (calc) — ABNORMAL HIGH (ref <130)
Total CHOL/HDL Ratio: 4.9 (calc) (ref <5.0)
Triglycerides: 125 mg/dL (ref <150)

## 2020-03-11 LAB — COMPLETE METABOLIC PANEL WITH GFR
AG Ratio: 1.4 (calc) (ref 1.0–2.5)
ALT: 13 U/L (ref 6–29)
AST: 11 U/L (ref 10–35)
Albumin: 4.3 g/dL (ref 3.6–5.1)
Alkaline phosphatase (APISO): 71 U/L (ref 31–125)
BUN: 14 mg/dL (ref 7–25)
CO2: 27 mmol/L (ref 20–32)
Calcium: 9.4 mg/dL (ref 8.6–10.2)
Chloride: 104 mmol/L (ref 98–110)
Creat: 0.68 mg/dL (ref 0.50–1.10)
GFR, Est African American: 122 mL/min/{1.73_m2} (ref >=60)
GFR, Est Non African American: 106 mL/min/{1.73_m2} (ref >=60)
Globulin: 3 g/dL (calc) (ref 1.9–3.7)
Glucose, Bld: 99 mg/dL (ref 65–99)
Potassium: 4.1 mmol/L (ref 3.5–5.3)
Sodium: 138 mmol/L (ref 135–146)
Total Bilirubin: 0.4 mg/dL (ref 0.2–1.2)
Total Protein: 7.3 g/dL (ref 6.1–8.1)

## 2020-03-11 LAB — MICROALBUMIN / CREATININE URINE RATIO
Creatinine, Urine: 159 mg/dL (ref 20–275)
Microalb Creat Ratio: 6 mcg/mg creat (ref <30)
Microalb, Ur: 1 mg/dL

## 2020-03-12 MED ORDER — OZEMPIC (1 MG/DOSE) 2 MG/1.5ML ~~LOC~~ SOPN: 1.0000 mg | PEN_INJECTOR | SUBCUTANEOUS | 1 refills | Status: DC

## 2020-03-17 ENCOUNTER — Inpatient Hospital Stay: Payer: 59

## 2020-03-17 ENCOUNTER — Other Ambulatory Visit: Payer: Self-pay

## 2020-03-17 VITALS — BP 128/82 | HR 76 | Temp 96.5°F | Resp 16

## 2020-03-17 DIAGNOSIS — I10 Essential (primary) hypertension: Secondary | ICD-10-CM | POA: Diagnosis not present

## 2020-03-17 DIAGNOSIS — Z79899 Other long term (current) drug therapy: Secondary | ICD-10-CM | POA: Diagnosis not present

## 2020-03-17 DIAGNOSIS — D508 Other iron deficiency anemias: Secondary | ICD-10-CM | POA: Diagnosis not present

## 2020-03-17 DIAGNOSIS — E119 Type 2 diabetes mellitus without complications: Secondary | ICD-10-CM | POA: Diagnosis not present

## 2020-03-17 DIAGNOSIS — G4730 Sleep apnea, unspecified: Secondary | ICD-10-CM | POA: Diagnosis not present

## 2020-03-17 MED ORDER — SODIUM CHLORIDE 0.9 % IV SOLN
Freq: Once | INTRAVENOUS | Status: AC
  Filled 2020-03-17: qty 250

## 2020-03-17 MED ORDER — IRON SUCROSE 20 MG/ML IV SOLN
200.0000 mg | Freq: Once | INTRAVENOUS | Status: AC
  Administered 2020-03-17: 200 mg via INTRAVENOUS
  Filled 2020-03-17: qty 10

## 2020-03-17 MED ORDER — SODIUM CHLORIDE 0.9 % IV SOLN: 200.0000 mg | Freq: Once | INTRAVENOUS | Status: DC

## 2020-03-17 NOTE — Progress Notes (Signed)
Patient received prescribed treatment in clinic. Tolerated well. Patient stable at discharge. 

## 2020-03-21 ENCOUNTER — Other Ambulatory Visit: Payer: Self-pay

## 2020-03-21 ENCOUNTER — Emergency Department
Admission: EM | Admit: 2020-03-21 | Discharge: 2020-03-21 | Disposition: A | Discharge status: Home / Self Care | Payer: 59 | Attending: Emergency Medicine | Admitting: Emergency Medicine

## 2020-03-21 ENCOUNTER — Encounter: Payer: Self-pay | Admitting: Emergency Medicine

## 2020-03-21 ENCOUNTER — Emergency Department: Payer: 59

## 2020-03-21 DIAGNOSIS — M79602 Pain in left arm: Secondary | ICD-10-CM | POA: Diagnosis not present

## 2020-03-21 DIAGNOSIS — R110 Nausea: Secondary | ICD-10-CM | POA: Insufficient documentation

## 2020-03-21 DIAGNOSIS — Z5321 Procedure and treatment not carried out due to patient leaving prior to being seen by health care provider: Secondary | ICD-10-CM | POA: Diagnosis not present

## 2020-03-21 DIAGNOSIS — R0789 Other chest pain: Secondary | ICD-10-CM | POA: Diagnosis not present

## 2020-03-21 DIAGNOSIS — R079 Chest pain, unspecified: Secondary | ICD-10-CM | POA: Diagnosis not present

## 2020-03-21 LAB — BASIC METABOLIC PANEL
Anion gap: 10 (ref 5–15)
BUN: 13 mg/dL (ref 6–20)
CO2: 23 mmol/L (ref 22–32)
Calcium: 9.3 mg/dL (ref 8.9–10.3)
Chloride: 104 mmol/L (ref 98–111)
Creatinine, Ser: 0.71 mg/dL (ref 0.44–1.00)
GFR, Estimated: >60 mL/min (ref >60)
Glucose, Bld: 114 mg/dL — ABNORMAL HIGH (ref 70–99)
Potassium: 3.8 mmol/L (ref 3.5–5.1)
Sodium: 137 mmol/L (ref 135–145)

## 2020-03-21 LAB — CBC
HCT: 41.2 % (ref 36.0–46.0)
Hemoglobin: 13.4 g/dL (ref 12.0–15.0)
MCH: 26.7 pg (ref 26.0–34.0)
MCHC: 32.5 g/dL (ref 30.0–36.0)
MCV: 82.2 fL (ref 80.0–100.0)
Platelets: 406 10*3/uL — ABNORMAL HIGH (ref 150–400)
RBC: 5.01 MIL/uL (ref 3.87–5.11)
RDW: 14.6 % (ref 11.5–15.5)
WBC: 6.8 10*3/uL (ref 4.0–10.5)
nRBC: 0 % (ref 0.0–0.2)

## 2020-03-21 LAB — TROPONIN I (HIGH SENSITIVITY)
Troponin I (High Sensitivity): 2 ng/L (ref <18)
Troponin I (High Sensitivity): 3 ng/L (ref <18)

## 2020-03-21 NOTE — ED Notes (Signed)
Pt reported to front desk staff that she was leaving

## 2020-03-21 NOTE — ED Triage Notes (Signed)
Pt reports he feels a heaviness on her chest and pain to left arm that started about 2 hrs ago. OPt reports some nausea no episodes of emesis Pt talks in complete sentences no respiratory distress noted.

## 2020-03-23 ENCOUNTER — Ambulatory Visit: Payer: Self-pay

## 2020-03-24 ENCOUNTER — Encounter: Payer: Self-pay | Admitting: Internal Medicine

## 2020-03-31 ENCOUNTER — Encounter: Payer: Self-pay | Admitting: Family Medicine

## 2020-03-31 ENCOUNTER — Ambulatory Visit (INDEPENDENT_AMBULATORY_CARE_PROVIDER_SITE_OTHER): Payer: 59 | Admitting: Family Medicine

## 2020-03-31 ENCOUNTER — Other Ambulatory Visit: Payer: Self-pay

## 2020-03-31 VITALS — BP 128/84 | HR 88 | Temp 98.3°F | Resp 16 | Ht 62.0 in | Wt 209.5 lb

## 2020-03-31 DIAGNOSIS — E11628 Type 2 diabetes mellitus with other skin complications: Secondary | ICD-10-CM | POA: Diagnosis not present

## 2020-03-31 DIAGNOSIS — E785 Hyperlipidemia, unspecified: Secondary | ICD-10-CM

## 2020-03-31 DIAGNOSIS — D500 Iron deficiency anemia secondary to blood loss (chronic): Secondary | ICD-10-CM | POA: Diagnosis not present

## 2020-03-31 DIAGNOSIS — Z09 Encounter for follow-up examination after completed treatment for conditions other than malignant neoplasm: Secondary | ICD-10-CM

## 2020-03-31 DIAGNOSIS — J45990 Exercise induced bronchospasm: Secondary | ICD-10-CM | POA: Diagnosis not present

## 2020-03-31 DIAGNOSIS — E6609 Other obesity due to excess calories: Secondary | ICD-10-CM | POA: Diagnosis not present

## 2020-03-31 DIAGNOSIS — J4531 Mild persistent asthma with (acute) exacerbation: Secondary | ICD-10-CM

## 2020-03-31 DIAGNOSIS — J45909 Unspecified asthma, uncomplicated: Secondary | ICD-10-CM | POA: Diagnosis not present

## 2020-03-31 DIAGNOSIS — E1169 Type 2 diabetes mellitus with other specified complication: Secondary | ICD-10-CM

## 2020-03-31 DIAGNOSIS — I10 Essential (primary) hypertension: Secondary | ICD-10-CM | POA: Diagnosis not present

## 2020-03-31 DIAGNOSIS — R079 Chest pain, unspecified: Secondary | ICD-10-CM | POA: Diagnosis not present

## 2020-03-31 MED ORDER — FLUTICASONE-SALMETEROL 100-50 MCG/DOSE IN AEPB: 1.0000 | INHALATION_SPRAY | Freq: Two times a day (BID) | RESPIRATORY_TRACT | 3 refills | Status: DC

## 2020-03-31 MED ORDER — ALBUTEROL SULFATE HFA 108 (90 BASE) MCG/ACT IN AERS: 2.0000 | INHALATION_SPRAY | Freq: Four times a day (QID) | RESPIRATORY_TRACT | 5 refills | Status: DC | PRN

## 2020-03-31 MED ORDER — LOSARTAN POTASSIUM 100 MG PO TABS
100.0000 mg | ORAL_TABLET | Freq: Every day | ORAL | 1 refills | Status: DC
Start: 2020-03-31 — End: 2020-04-13

## 2020-03-31 MED ORDER — MONTELUKAST SODIUM 10 MG PO TABS
10.0000 mg | ORAL_TABLET | Freq: Every day | ORAL | 3 refills | Status: DC
Start: 2020-03-31 — End: 2020-07-22

## 2020-03-31 NOTE — Patient Instructions (Addendum)
Start advair and singulair for asthma Keep walking and doing all the healthy things you're doing - low salt, pushing water  Keep taking losartan 100 mg daily  Let me know if you continue to have symptoms, high blood pressure or pain an\d we will need to get you to cardiology right away  Follow up here in one month     Managing Your Hypertension Hypertension is commonly called high blood pressure. This is when the force of your blood pressing against the walls of your arteries is too strong. Arteries are blood vessels that carry blood from your heart throughout your body. Hypertension forces the heart to work harder to pump blood, and may cause the arteries to become narrow or stiff. Having untreated or uncontrolled hypertension can cause heart attack, stroke, kidney disease, and other problems. What are blood pressure readings? A blood pressure reading consists of a higher number over a lower number. Ideally, your blood pressure should be below 120/80. The first ("top") number is called the systolic pressure. It is a measure of the pressure in your arteries as your heart beats. The second ("bottom") number is called the diastolic pressure. It is a measure of the pressure in your arteries as the heart relaxes. What does my blood pressure reading mean? Blood pressure is classified into four stages. Based on your blood pressure reading, your health care provider may use the following stages to determine what type of treatment you need, if any. Systolic pressure and diastolic pressure are measured in a unit called mm Hg. Normal  Systolic pressure: below 120.  Diastolic pressure: below 80. Elevated  Systolic pressure: 120-129.  Diastolic pressure: below 80. Hypertension stage 1  Systolic pressure: 130-139.  Diastolic pressure: 80-89. Hypertension stage 2  Systolic pressure: 140 or above.  Diastolic pressure: 90 or above. What health risks are associated with hypertension? Managing  your hypertension is an important responsibility. Uncontrolled hypertension can lead to:  A heart attack.  A stroke.  A weakened blood vessel (aneurysm).  Heart failure.  Kidney damage.  Eye damage.  Metabolic syndrome.  Memory and concentration problems. What changes can I make to manage my hypertension? Hypertension can be managed by making lifestyle changes and possibly by taking medicines. Your health care provider will help you make a plan to bring your blood pressure within a normal range. Eating and drinking   Eat a diet that is high in fiber and potassium, and low in salt (sodium), added sugar, and fat. An example eating plan is called the DASH (Dietary Approaches to Stop Hypertension) diet. To eat this way: ? Eat plenty of fresh fruits and vegetables. Try to fill half of your plate at each meal with fruits and vegetables. ? Eat whole grains, such as whole wheat pasta, brown rice, or whole grain bread. Fill about one quarter of your plate with whole grains. ? Eat low-fat diary products. ? Avoid fatty cuts of meat, processed or cured meats, and poultry with skin. Fill about one quarter of your plate with lean proteins such as fish, chicken without skin, beans, eggs, and tofu. ? Avoid premade and processed foods. These tend to be higher in sodium, added sugar, and fat.  Reduce your daily sodium intake. Most people with hypertension should eat less than 1,500 mg of sodium a day.  Limit alcohol intake to no more than 1 drink a day for nonpregnant women and 2 drinks a day for men. One drink equals 12 oz of beer, 5 oz of  wine, or 1 oz of hard liquor. Lifestyle  Work with your health care provider to maintain a healthy body weight, or to lose weight. Ask what an ideal weight is for you.  Get at least 30 minutes of exercise that causes your heart to beat faster (aerobic exercise) most days of the week. Activities may include walking, swimming, or biking.  Include exercise to  strengthen your muscles (resistance exercise), such as weight lifting, as part of your weekly exercise routine. Try to do these types of exercises for 30 minutes at least 3 days a week.  Do not use any products that contain nicotine or tobacco, such as cigarettes and e-cigarettes. If you need help quitting, ask your health care provider.  Control any long-term (chronic) conditions you have, such as high cholesterol or diabetes. Monitoring  Monitor your blood pressure at home as told by your health care provider. Your personal target blood pressure may vary depending on your medical conditions, your age, and other factors.  Have your blood pressure checked regularly, as often as told by your health care provider. Working with your health care provider  Review all the medicines you take with your health care provider because there may be side effects or interactions.  Talk with your health care provider about your diet, exercise habits, and other lifestyle factors that may be contributing to hypertension.  Visit your health care provider regularly. Your health care provider can help you create and adjust your plan for managing hypertension. Will I need medicine to control my blood pressure? Your health care provider may prescribe medicine if lifestyle changes are not enough to get your blood pressure under control, and if:  Your systolic blood pressure is 130 or higher.  Your diastolic blood pressure is 80 or higher. Take medicines only as told by your health care provider. Follow the directions carefully. Blood pressure medicines must be taken as prescribed. The medicine does not work as well when you skip doses. Skipping doses also puts you at risk for problems. Contact a health care provider if:  You think you are having a reaction to medicines you have taken.  You have repeated (recurrent) headaches.  You feel dizzy.  You have swelling in your ankles.  You have trouble with your  vision. Get help right away if:  You develop a severe headache or confusion.  You have unusual weakness or numbness, or you feel faint.  You have severe pain in your chest or abdomen.  You vomit repeatedly.  You have trouble breathing. Summary  Hypertension is when the force of blood pumping through your arteries is too strong. If this condition is not controlled, it may put you at risk for serious complications.  Your personal target blood pressure may vary depending on your medical conditions, your age, and other factors. For most people, a normal blood pressure is less than 120/80.  Hypertension is managed by lifestyle changes, medicines, or both. Lifestyle changes include weight loss, eating a healthy, low-sodium diet, exercising more, and limiting alcohol. This information is not intended to replace advice given to you by your health care provider. Make sure you discuss any questions you have with your health care provider. Document Revised: 07/13/2018 Document Reviewed: 02/17/2016 Elsevier Patient Education  [place].

## 2020-03-31 NOTE — Progress Notes (Signed)
Name: Donna Reyes   MRN: [id number]    DOB: [birth year]   Date:03/31/2020       Progress Note  Chief Complaint  Patient presents with  . Hypertension    Elevated BP 141/104, 151/96. Went to ER was there 5 hours and then left without being seen     Subjective:   Donna Reyes is a 44 y.o. female, presents to clinic for CP and elevated BP f/up She went to the ER when she was having chest pain and her blood pressure was noted to be high, labs, troponin, EKG and chest x-ray were done but she left before being assessed by a provider.  She has seen cardiology in the past-she reports around 2016 after ER visit [name] - in the past for leaky valve/HTN issues  Sign for records, did stress test, ECHO, leaky valve, doesn't remember which one  Asthma worse with weather, she is waking up more often, using her rescue inhaler more often, exposure to her husband cigarette smoke has triggered more wheezing more easily than what is normal for her, she has been on a maintenance inhaler in the past but is not currently on 1.  Chest pain - BP monitoring at home 140/96, increased losartan to 100 mg Chest pain does seem to be related to her breathing feels like a tightness, she denies orthopnea, lower extremity edema, palpitations, exertional symptoms, near syncope, nausea, vomiting, diaphoresis associated with pain Cut out salt, walking, drinking more water EKG, chest x-ray and labs from the ER reviewed with the patient today and were previously reviewed  Wt Readings from Last 10 Encounters:  03/31/20 209 lb 8 oz (95 kg)  03/21/20 210 lb (95.3 kg)  03/10/20 210 lb 14.4 oz (95.7 kg)  02/04/20 200 lb 12.8 oz (91.1 kg)  12/12/19 206 lb 6.4 oz (93.6 kg)  12/03/19 205 lb 0.4 oz (93 kg)  10/10/19 202 lb (91.6 kg)  07/23/19 203 lb (92.1 kg)  06/20/19 200 lb 4.8 oz (90.9 kg)  05/22/19 203 lb (92.1 kg)   BMI Readings from Last 5 Encounters:  03/31/20 38.32 kg/m  03/21/20 38.41  kg/m  03/10/20 38.57 kg/m  02/04/20 36.73 kg/m  12/12/19 37.75 kg/m    DM and HLD -  Lab Results  Component Value Date   HGBA1C 6.7 (H) 03/10/2020   Diabetes is well controlled and she just started rosuvastatin     Current Outpatient Medications:  .  albuterol (VENTOLIN HFA) 108 (90 Base) MCG/ACT inhaler, Inhale 2 puffs into the lungs every 6 (six) hours as needed for wheezing or shortness of breath., Disp: 8 g, Rfl: 0 .  losartan (COZAAR) 50 MG tablet, Take 1 tablet (50 mg total) by mouth daily., Disp: 90 tablet, Rfl: 1 .  rosuvastatin (CRESTOR) 10 MG tablet, Take 1 tablet (10 mg total) by mouth daily., Disp: 90 tablet, Rfl: 1 .  Semaglutide, 1 MG/DOSE, (OZEMPIC, 1 MG/DOSE,) 2 MG/1.5ML SOPN, Inject 1 mg into the skin once a week., Disp: 6.5 mL, Rfl: 1 .  blood glucose meter kit and supplies KIT, Dispense based on patient and insurance preference. Use up to four times daily as directed. (FOR ICD-9 250.00, 250.01). (Patient not taking: Reported on 03/31/2020), Disp: 1 each, Rfl: 0 .  cyclobenzaprine (FLEXERIL) 5 MG tablet, Take 1 tablet (5 mg total) by mouth at bedtime. Take as needed, May increase to two tabs at bedtime pending response (Patient not taking: Reported on 03/31/2020), Disp: 30 tablet, Rfl:  2 .  Insulin Pen Needle (NOVOFINE) 32G X 6 MM MISC, 1 each by Does not apply route once a week. (Patient not taking: Reported on 03/31/2020), Disp: 50 each, Rfl: 1 .  medroxyPROGESTERone Acetate 150 MG/ML SUSY, Inject 1 mL (150 mg total) into the muscle once for 1 dose. Q 9-12 wks, Disp: 1 mL, Rfl: 1 .  meloxicam (MOBIC) 15 MG tablet, Take 1 tablet (15 mg total) by mouth daily. Take as needed with food (Patient not taking: Reported on 03/31/2020), Disp: 30 tablet, Rfl: 1  Patient Active Problem List   Diagnosis Date Noted  . Obesity due to excess calories with serious comorbidity 03/10/2020  . Hyperlipidemia associated with type 2 diabetes mellitus ([place]) 03/10/2020  . Abscess of ear  canal, right 12/12/2019  . Family history of pancreatic cancer 10/10/2019  . Right ovarian cyst 10/10/2019  . Anxiety 03/05/2019  . Controlled type 2 diabetes mellitus with skin complication ([place]) 00/17/4944  . Palpable lymph node 08/17/2018  . Menorrhagia with regular cycle 08/14/2018  . Lymphadenopathy, axillary 10/27/2017  . Anemia 10/27/2017  . Chest pain 01/06/2016  . Essential hypertension 01/06/2016  . Mild intermittent asthma with acute exacerbation 01/06/2016  . Iron deficiency anemia 12/30/2015    Past Surgical History:  Procedure Laterality Date  . AXILLARY LYMPH NODE BIOPSY Right 05/25/2017   LYMPHADENITIS. NEGATIVE FOR MALIGNANCY.   Marland Kitchen BREAST BIOPSY Right 05/25/2017   neg/NEGATIVE FOR CARCINOMA. INFLAMMATION AND SQUAMOUS METAPLASIA OF A FOCAL DUCT   . CESAREAN SECTION     times 3  . TUBAL LIGATION      Family History  Problem Relation Age of Onset  . Diabetes Mother   . Heart failure Mother   . Kidney disease Mother   . Diabetes Maternal Grandmother   . Breast cancer Maternal Grandmother 42  . Hypertension Father   . Hyperlipidemia Father   . Atrial fibrillation Sister   . ADD / ADHD Son   . Heart attack Maternal Grandfather   . Diabetes Paternal Grandmother   . Hypertension Paternal Grandmother   . Prostate cancer Paternal Grandfather   . Colon cancer Paternal Grandfather 99  . Polycystic ovary syndrome Sister   . Polycystic ovary syndrome Sister   . Pancreatic cancer Paternal Aunt 44  . Ovarian cancer Neg Hx     Social History   Tobacco Use  . Smoking status: Never Smoker  . Smokeless tobacco: Never Used  Vaping Use  . Vaping Use: Never used  Substance Use Topics  . Alcohol use: No  . Drug use: No     Allergies  Allergen Reactions  . Lisinopril     Health Maintenance  Topic Date Due  . COLONOSCOPY (Pts 45-53yr Insurance coverage will need to be confirmed)  Never done  . FOOT EXAM  01/29/2020  . COVID-19 Vaccine (3 - Pfizer risk  4-dose series) 05/01/2020 (Originally 11/23/2019)  . HEMOGLOBIN A1C  09/08/2020  . PAP SMEAR-Modifier  09/11/2020  . OPHTHALMOLOGY EXAM  01/06/2021  . TETANUS/TDAP  09/14/2027  . INFLUENZA VACCINE  Completed  . PNEUMOCOCCAL POLYSACCHARIDE VACCINE AGE 55-64 HIGH RISK  Completed  . Hepatitis C Screening  Completed  . HIV Screening  Completed    Chart Review Today: I personally reviewed active problem list, medication list, allergies, family history, social history, health maintenance, notes from last encounter, lab results, imaging with the patient/caregiver today. (chart further reviewed as noted in HPI)  Review of Systems  Constitutional: Negative.   HENT: Negative.  Eyes: Negative.   Respiratory: Negative.   Cardiovascular: Negative.   Gastrointestinal: Negative.   Endocrine: Negative.   Genitourinary: Negative.   Musculoskeletal: Negative.   Skin: Negative.   Allergic/Immunologic: Negative.   Neurological: Negative.   Hematological: Negative.   Psychiatric/Behavioral: Negative.   All other systems reviewed and are negative.    Objective:   Vitals:   03/31/20 0825  BP: 128/84  Pulse: 88  Resp: 16  Temp: 98.3 F (36.8 C)  TempSrc: Oral  SpO2: 99%  Weight: 209 lb 8 oz (95 kg)  Height: '5\' 2"'  (1.575 m)    Body mass index is 38.32 kg/m.  Physical Exam Vitals and nursing note reviewed.  Constitutional:      General: She is not in acute distress.    Appearance: Normal appearance. She is well-developed. She is obese. She is not ill-appearing, toxic-appearing or diaphoretic.     Interventions: Face mask in place.  HENT:     Head: Normocephalic and atraumatic.     Right Ear: External ear normal.     Left Ear: External ear normal.  Eyes:     General: Lids are normal. No scleral icterus.       Right eye: No discharge.        Left eye: No discharge.     Conjunctiva/sclera: Conjunctivae normal.  Neck:     Trachea: Phonation normal. No tracheal deviation.   Cardiovascular:     Rate and Rhythm: Normal rate and regular rhythm.     Pulses: Normal pulses.          Radial pulses are 2+ on the right side and 2+ on the left side.       Posterior tibial pulses are 2+ on the right side and 2+ on the left side.     Heart sounds: Normal heart sounds. No murmur heard. No friction rub. No gallop.   Pulmonary:     Effort: Pulmonary effort is normal. No respiratory distress.     Breath sounds: Normal breath sounds. No stridor. No wheezing, rhonchi or rales.  Chest:     Chest wall: No tenderness.  Abdominal:     General: Bowel sounds are normal. There is no distension.     Palpations: Abdomen is soft.  Musculoskeletal:     Right lower leg: No edema.     Left lower leg: No edema.  Skin:    General: Skin is warm and dry.     Coloration: Skin is not jaundiced or pale.     Findings: No rash.  Neurological:     Mental Status: She is alert.     Motor: No abnormal muscle tone.     Gait: Gait normal.  Psychiatric:        Mood and Affect: Mood normal.        Speech: Speech normal.        Behavior: Behavior normal.         Assessment & Plan:   1. Essential hypertension  Patient's blood pressure today was repeated multiple times initially was very elevated and was rechecked 2 times and subsequently improved to 128/84-it was initially 151/96 then 141/104 and then improved She is monitoring at home readings there have been 140s over 80s to 90s She increased her losartan dose about 4 days ago from 50 mg up to 100 mg, she has been working on diet lifestyle changes, low-salt, exercising and pushing fluids Slight weight increase over the past several months may be contributing towards  uncontrolled blood pressure, she also has uncontrolled asthma symptoms which may be creating some resistance and increased blood pressure as well We discussed working on these things and monitoring her blood pressure with a close follow-up in about 1 month  Most of her  chest pain symptoms do seem to be related to asthma symptoms, encouraged patient to let me know if she continues to have any other chest pain symptoms or cardiac symptoms and we will get her back with cardiology for follow-up.  She notes a history of a leaky valve and doing a fairly extensive work-up about 4-5 years ago, however I cannot see these records We are requesting records from Dr. [name], she can f/up with him or another cardiogist with [facility] if desired.  - losartan (COZAAR) 100 MG tablet; Take 1 tablet (100 mg total) by mouth daily.  Dispense: 90 tablet; Refill: 1    ICD-10-CM   1. Essential hypertension  I10 losartan (COZAAR) 100 MG tablet   Elevated blood pressure, today it is at goal with losartan increased to 100 mg   2. Obesity due to excess calories with serious comorbidity, unspecified classification  E66.09    Weight increased over the last couple months -may be contributing towards increased blood pressure?  Encouraged healthy diet, lifestyle increasing exercise   3. Iron deficiency anemia due to chronic blood loss  D50.0    Reviewed labs from ER, anemia has improved not likely related to shortness of breath or chest pain   4. Controlled type 2 diabetes mellitus with other skin complication, without long-term current use of insulin (HCC)  E11.628    Well-controlled   5. Hyperlipidemia associated with type 2 diabetes mellitus ([place])  E11.69    E78.5    She recently started rosuvastatin, no side effects or concerns as of yet   6. Persistent asthma without complication, unspecified asthma severity  J45.909    Uncontrolled with weather changes and multiple triggers restart maintenance inhaler, avoid triggers, manage allergies follow-up if not improving   7. Exercise induced bronchospasm  J45.990    Continue using albuterol rescue inhaler 2 puffs prior to exercise and otherwise as needed for asthma symptoms   8. Chest pain, unspecified type  R07.9    Negative troponin x2, EKG  without any ST elevation or depression, suspect it may be more related to her asthma or elevated blood pressure possibly GERD? I requested records from cardiology to compare EKGs to past EKGs and to review echo The pain is intermittent does feel a tightness and wheeze she does believe it has been associated with her elevated blood pressure however it is hard to know if asthma symptoms are causing increased blood pressure and her symptoms?  Or if she is having chest pain from elevated blood pressure due to other causes? With ER work-up though she did not stay for evaluation, chest x-ray was unremarkable, EKG was negative for signs of ischemia, troponin was negative x2 and the history of her chest pain is inconsistent with ACS.  That being said, if she continues to have recurrence of chest pain or any other associated cardiac symptoms she was encouraged to let me know so I can refer her back to cardiology for follow-up   9. Encounter for examination following treatment at hospital  Z09    ER visit, results, EKG and chest x-ray reviewed   She is going to restart Advair and Singulair to manage her asthma: Instructions given to patient on AVS: Start advair and singulair  for asthma Keep walking and doing all the healthy things you're doing - low salt, pushing water  Keep taking losartan 100 mg daily  Let me know if you continue to have symptoms, high blood pressure or pain and we will need to get you to cardiology right away  Follow up here in one month   Additional information given for managing hypertension   Singulair and Advair were sent into the pharmacy as well as albuterol rescue inhaler refills and put back onto her med list -I did discuss with her that she may only need these through the winter season or with weather changes and then she may be able to discontinue them again when she has less asthma triggers  Return in about 1 month (around 05/01/2020) for Routine follow-up HTN.   Delsa Grana, PA-C 03/31/20 8:46 AM

## 2020-04-12 ENCOUNTER — Encounter: Payer: Self-pay | Admitting: Family Medicine

## 2020-04-13 MED ORDER — LOSARTAN POTASSIUM-HCTZ 100-12.5 MG PO TABS: 1.0000 | ORAL_TABLET | Freq: Every day | ORAL | 0 refills | Status: DC

## 2020-05-05 ENCOUNTER — Ambulatory Visit (INDEPENDENT_AMBULATORY_CARE_PROVIDER_SITE_OTHER): Payer: 59 | Admitting: Family Medicine

## 2020-05-05 ENCOUNTER — Encounter: Payer: Self-pay | Admitting: Family Medicine

## 2020-05-05 ENCOUNTER — Other Ambulatory Visit: Payer: Self-pay | Admitting: Family Medicine

## 2020-05-05 ENCOUNTER — Other Ambulatory Visit: Payer: Self-pay

## 2020-05-05 VITALS — BP 118/78 | HR 89 | Temp 97.8°F | Resp 16 | Ht 62.0 in | Wt 208.5 lb

## 2020-05-05 DIAGNOSIS — D500 Iron deficiency anemia secondary to blood loss (chronic): Secondary | ICD-10-CM

## 2020-05-05 DIAGNOSIS — Z5181 Encounter for therapeutic drug level monitoring: Secondary | ICD-10-CM

## 2020-05-05 DIAGNOSIS — E1169 Type 2 diabetes mellitus with other specified complication: Secondary | ICD-10-CM | POA: Diagnosis not present

## 2020-05-05 DIAGNOSIS — I10 Essential (primary) hypertension: Secondary | ICD-10-CM | POA: Diagnosis not present

## 2020-05-05 DIAGNOSIS — J45909 Unspecified asthma, uncomplicated: Secondary | ICD-10-CM | POA: Diagnosis not present

## 2020-05-05 DIAGNOSIS — E785 Hyperlipidemia, unspecified: Secondary | ICD-10-CM

## 2020-05-05 DIAGNOSIS — E6609 Other obesity due to excess calories: Secondary | ICD-10-CM | POA: Diagnosis not present

## 2020-05-05 DIAGNOSIS — E11628 Type 2 diabetes mellitus with other skin complications: Secondary | ICD-10-CM

## 2020-05-05 NOTE — Progress Notes (Signed)
Name: Donna Reyes   MRN: [id number]    DOB: [birth year]   Date:05/05/2020       Progress Note  Chief Complaint  Patient presents with  . Hypertension    1 month follow up     Subjective:   Donna Reyes is a 45 y.o. female, presents to clinic for BP f/up  Hypertension:  Currently managed on losartan-HCTZ 100-12.5 - BP monitoring at home 120's/70's Pt reports good med compliance and denies any SE.   Blood pressure today is well controlled. BP Readings from Last 3 Encounters:  05/05/20 118/78  03/31/20 128/84  03/21/20 (!) 137/94   Pt denies CP, SOB, exertional sx, LE edema, palpitation, Ha's, visual disturbances, lightheadedness, hypotension, syncope. Dietary efforts for BP?  She's been working on low salt and healthier food options  DM - still on ozempic 1 mg injection weekly  HLD - good compliance with crestor 10   Asthma - well controlled on advair, singulair and albuterol rescue inhaler - not needing rescue inhaler at all recently     Current Outpatient Medications:  .  albuterol (VENTOLIN HFA) 108 (90 Base) MCG/ACT inhaler, Inhale 2 puffs into the lungs every 6 (six) hours as needed for wheezing or shortness of breath. May also use 2 puffs prior to exercise for EIB, Disp: 8 g, Rfl: 5 .  Fluticasone-Salmeterol (ADVAIR DISKUS) 100-50 MCG/DOSE AEPB, Inhale 1 puff into the lungs 2 (two) times daily., Disp: 60 each, Rfl: 3 .  losartan-hydrochlorothiazide (HYZAAR) 100-12.5 MG tablet, Take 1 tablet by mouth daily., Disp: 60 tablet, Rfl: 0 .  montelukast (SINGULAIR) 10 MG tablet, Take 1 tablet (10 mg total) by mouth at bedtime., Disp: 30 tablet, Rfl: 3 .  rosuvastatin (CRESTOR) 10 MG tablet, Take 1 tablet (10 mg total) by mouth daily., Disp: 90 tablet, Rfl: 1 .  Semaglutide, 1 MG/DOSE, (OZEMPIC, 1 MG/DOSE,) 2 MG/1.5ML SOPN, Inject 1 mg into the skin once a week., Disp: 6.5 mL, Rfl: 1  Patient Active Problem List   Diagnosis Date Noted  . Obesity due to  excess calories with serious comorbidity 03/10/2020  . Hyperlipidemia associated with type 2 diabetes mellitus ([place]) 03/10/2020  . Abscess of ear canal, right 12/12/2019  . Family history of pancreatic cancer 10/10/2019  . Right ovarian cyst 10/10/2019  . Anxiety 03/05/2019  . Controlled type 2 diabetes mellitus with skin complication ([place]) 97/67/3419  . Palpable lymph node 08/17/2018  . Menorrhagia with regular cycle 08/14/2018  . Lymphadenopathy, axillary 10/27/2017  . Anemia 10/27/2017  . Chest pain 01/06/2016  . Essential hypertension 01/06/2016  . Mild intermittent asthma with acute exacerbation 01/06/2016  . Iron deficiency anemia 12/30/2015    Past Surgical History:  Procedure Laterality Date  . AXILLARY LYMPH NODE BIOPSY Right 05/25/2017   LYMPHADENITIS. NEGATIVE FOR MALIGNANCY.   Marland Kitchen BREAST BIOPSY Right 05/25/2017   neg/NEGATIVE FOR CARCINOMA. INFLAMMATION AND SQUAMOUS METAPLASIA OF A FOCAL DUCT   . CESAREAN SECTION     times 3  . TUBAL LIGATION      Family History  Problem Relation Age of Onset  . Diabetes Mother   . Heart failure Mother   . Kidney disease Mother   . Diabetes Maternal Grandmother   . Breast cancer Maternal Grandmother 60  . Hypertension Father   . Hyperlipidemia Father   . Atrial fibrillation Sister   . ADD / ADHD Son   . Heart attack Maternal Grandfather   . Diabetes Paternal Grandmother   .  Hypertension Paternal Grandmother   . Prostate cancer Paternal Grandfather   . Colon cancer Paternal Grandfather 59  . Polycystic ovary syndrome Sister   . Polycystic ovary syndrome Sister   . Pancreatic cancer Paternal Aunt 63  . Ovarian cancer Neg Hx     Social History   Tobacco Use  . Smoking status: Never Smoker  . Smokeless tobacco: Never Used  Vaping Use  . Vaping Use: Never used  Substance Use Topics  . Alcohol use: No  . Drug use: No     Allergies  Allergen Reactions  . Lisinopril     Health Maintenance  Topic Date Due  .  COLONOSCOPY (Pts 45-52yrs Insurance coverage will need to be confirmed)  Never done  . FOOT EXAM  01/29/2020  . HEMOGLOBIN A1C  09/08/2020  . PAP SMEAR-Modifier  09/11/2020  . COVID-19 Vaccine (4 - Booster for Pfizer series) 10/26/2020  . OPHTHALMOLOGY EXAM  01/06/2021  . TETANUS/TDAP  09/14/2027  . INFLUENZA VACCINE  Completed  . PNEUMOCOCCAL POLYSACCHARIDE VACCINE AGE 24-64 HIGH RISK  Completed  . Hepatitis C Screening  Completed  . HIV Screening  Completed    Chart Review Today: I personally reviewed active problem list, medication list, allergies, family history, social history, health maintenance, notes from last encounter, lab results, imaging with the patient/caregiver today.   Review of Systems  Constitutional: Negative.   HENT: Negative.   Eyes: Negative.   Respiratory: Negative.   Cardiovascular: Negative.   Gastrointestinal: Negative.   Endocrine: Negative.   Genitourinary: Negative.   Musculoskeletal: Negative.   Skin: Negative.   Allergic/Immunologic: Negative.   Neurological: Negative.   Hematological: Negative.   Psychiatric/Behavioral: Negative.   All other systems reviewed and are negative.    Objective:   Vitals:   05/05/20 0758  BP: 118/78  Pulse: 89  Resp: 16  Temp: 97.8 F (36.6 C)  TempSrc: Oral  SpO2: 96%  Weight: 208 lb 8 oz (94.6 kg)  Height: 5\' 2"  (1.575 m)    Body mass index is 38.14 kg/m.  Physical Exam Vitals and nursing note reviewed.  Constitutional:      General: She is not in acute distress.    Appearance: Normal appearance. She is well-developed. She is obese. She is not ill-appearing, toxic-appearing or diaphoretic.     Interventions: Face mask in place.  HENT:     Head: Normocephalic and atraumatic.     Right Ear: External ear normal.     Left Ear: External ear normal.  Eyes:     General: Lids are normal. No scleral icterus.       Right eye: No discharge.        Left eye: No discharge.     Conjunctiva/sclera:  Conjunctivae normal.  Neck:     Trachea: Phonation normal. No tracheal deviation.  Cardiovascular:     Rate and Rhythm: Normal rate and regular rhythm.     Pulses: Normal pulses.          Radial pulses are 2+ on the right side and 2+ on the left side.       Posterior tibial pulses are 2+ on the right side and 2+ on the left side.     Heart sounds: Normal heart sounds. No murmur heard. No friction rub. No gallop.   Pulmonary:     Effort: Pulmonary effort is normal. No respiratory distress.     Breath sounds: Normal breath sounds. No stridor. No wheezing, rhonchi or rales.  Chest:  Chest wall: No tenderness.  Abdominal:     General: Bowel sounds are normal. There is no distension.     Palpations: Abdomen is soft.  Musculoskeletal:     Right lower leg: No edema.     Left lower leg: No edema.  Skin:    General: Skin is warm and dry.     Coloration: Skin is not jaundiced or pale.     Findings: No rash.  Neurological:     Mental Status: She is alert.     Motor: No abnormal muscle tone.     Gait: Gait normal.  Psychiatric:        Mood and Affect: Mood normal.        Speech: Speech normal.        Behavior: Behavior normal.         Assessment & Plan:   1. Essential hypertension Well controlled, compliant with meds, no SE or concerning sx, BP now at goal after med changes Continue losartan-HCTZ 100-12.5  Pt encouraged to continue to work on healthy diet (low salt) and lifestyle for improving HTN management - DASH diet handout offered today  2. Obesity due to excess calories with serious comorbidity, unspecified classification Working on healthy diet/food choices, trying to exercise when weather permits  3. Controlled type 2 diabetes mellitus with other skin complication, without long-term current use of insulin (HCC) Stable, well controlled, compliant with ozempic - has f/up appt in April for labs, will need foot exam  4. Hyperlipidemia associated with type 2 diabetes  mellitus ([place]) Compliant with meds, no SE, no myalgias, fatigue or jaundice  5. Persistent asthma without complication, unspecified asthma severity Improved respiratory sx, no longer having frequent SOB/wheeze/chest tightness, better controlled Continue advair and singulair Continue albuterol PRN and prior to exercise for EIB   Follow up scheduled in April -  Labs ordered today, she wishes to complete prior to appt   ICD-10-CM   1. Essential hypertension  99991111 COMPLETE METABOLIC PANEL WITH GFR  2. Obesity due to excess calories with serious comorbidity, unspecified classification  [id number] COMPLETE METABOLIC PANEL WITH GFR  3. Controlled type 2 diabetes mellitus with other skin complication, without long-term current use of insulin (HCC)  123456 COMPLETE METABOLIC PANEL WITH GFR    Hemoglobin A1c  4. Hyperlipidemia associated with type 2 diabetes mellitus (HCC)  [id number] COMPLETE METABOLIC PANEL WITH GFR   E78.5 Lipid panel  5. Persistent asthma without complication, unspecified asthma severity  J45.909   6. Iron deficiency anemia due to chronic blood loss  D50.0 CBC with Differential/Platelet  7. Encounter for medication monitoring  Z51.81 CBC with Differential/Platelet    COMPLETE METABOLIC PANEL WITH GFR    Lipid panel    Hemoglobin A1c     Delsa Grana, PA-C 05/05/20 8:20 AM

## 2020-05-12 ENCOUNTER — Ambulatory Visit: Payer: Medicaid Other

## 2020-05-20 ENCOUNTER — Encounter: Payer: Self-pay | Admitting: Family Medicine

## 2020-05-24 ENCOUNTER — Other Ambulatory Visit: Payer: Self-pay

## 2020-05-24 ENCOUNTER — Encounter: Payer: Self-pay | Admitting: Emergency Medicine

## 2020-05-24 ENCOUNTER — Ambulatory Visit
Admission: EM | Admit: 2020-05-24 | Discharge: 2020-05-24 | Disposition: A | Discharge status: Home / Self Care | Payer: 59 | Attending: Family Medicine | Admitting: Family Medicine

## 2020-05-24 DIAGNOSIS — J45901 Unspecified asthma with (acute) exacerbation: Secondary | ICD-10-CM

## 2020-05-24 MED ORDER — PREDNISONE 50 MG PO TABS: ORAL_TABLET | ORAL | 0 refills | Status: DC

## 2020-05-24 NOTE — ED Provider Notes (Signed)
[facility]    CSN: [id number] Arrival date & time: 05/24/20  1304      History   Chief Complaint Chief Complaint  Patient presents with  . Shortness of Breath  . Asthma   HPI  45 year old female presents with the above complaints.  Patient reports that she has had worsening asthma symptoms since Wednesday.  She has known asthma which is treated with albuterol and Advair as well as Singulair.  She states that she has been using albuterol and her home medications without improvement.  She feels like her chest is tight and she is short of breath.  She states that her discomfort is 6/10 in severity.  Last albuterol treatment was approximately 2 hours ago.  No fever.  No other associated symptoms.  No other complaints.  Past Medical History:  Diagnosis Date  . Allergy   . Anemia   . Asthma   . BRCA negative 10/2019   MyRisk neg; IBIS=7.8%  . Diabetes mellitus without complication ([place])   . Family history of pancreatic cancer   . Hypertension   . Sleep apnea     Patient Active Problem List   Diagnosis Date Noted  . Obesity due to excess calories with serious comorbidity 03/10/2020  . Hyperlipidemia associated with type 2 diabetes mellitus ([place]) 03/10/2020  . Abscess of ear canal, right 12/12/2019  . Family history of pancreatic cancer 10/10/2019  . Right ovarian cyst 10/10/2019  . Anxiety 03/05/2019  . Controlled type 2 diabetes mellitus with skin complication ([place]) 53/66/4403  . Palpable lymph node 08/17/2018  . Menorrhagia with regular cycle 08/14/2018  . Lymphadenopathy, axillary 10/27/2017  . Anemia 10/27/2017  . Chest pain 01/06/2016  . Essential hypertension 01/06/2016  . Mild intermittent asthma with acute exacerbation 01/06/2016  . Iron deficiency anemia 12/30/2015    Past Surgical History:  Procedure Laterality Date  . AXILLARY LYMPH NODE BIOPSY Right 05/25/2017   LYMPHADENITIS. NEGATIVE FOR MALIGNANCY.   Marland Kitchen BREAST BIOPSY Right 05/25/2017    neg/NEGATIVE FOR CARCINOMA. INFLAMMATION AND SQUAMOUS METAPLASIA OF A FOCAL DUCT   . CESAREAN SECTION     times 3  . TUBAL LIGATION      OB History    Gravida  5   Para  5   Term  5   Preterm      AB      Living  5     SAB      IAB      Ectopic      Multiple      Live Births  5        Obstetric Comments  1st Menstrual Cycle:  13 1st Pregnancy:  14          Home Medications    Prior to Admission medications   Medication Sig Start Date End Date Taking? Authorizing Provider  albuterol (VENTOLIN HFA) 108 (90 Base) MCG/ACT inhaler Inhale 2 puffs into the lungs every 6 (six) hours as needed for wheezing or shortness of breath. May also use 2 puffs prior to exercise for EIB 03/31/20  Yes Delsa Grana, PA-C  Fluticasone-Salmeterol (ADVAIR DISKUS) 100-50 MCG/DOSE AEPB Inhale 1 puff into the lungs 2 (two) times daily. 03/31/20  Yes Delsa Grana, PA-C  losartan-hydrochlorothiazide (HYZAAR) 100-12.5 MG tablet Take 1 tablet by mouth daily. 04/13/20  Yes Delsa Grana, PA-C  montelukast (SINGULAIR) 10 MG tablet Take 1 tablet (10 mg total) by mouth at bedtime. 03/31/20  Yes Delsa Grana, PA-C  predniSONE (DELTASONE)  50 MG tablet 1 tablet daily x 5 days 05/24/20  Yes Thecla Forgione G, DO  rosuvastatin (CRESTOR) 10 MG tablet Take 1 tablet (10 mg total) by mouth daily. 03/10/20  Yes Towanda Malkin, MD  Semaglutide, 1 MG/DOSE, (OZEMPIC, 1 MG/DOSE,) 2 MG/1.5ML SOPN Inject 1 mg into the skin once a week. 03/12/20  Yes Delsa Grana, PA-C    Family History Family History  Problem Relation Age of Onset  . Diabetes Mother   . Heart failure Mother   . Kidney disease Mother   . Diabetes Maternal Grandmother   . Breast cancer Maternal Grandmother 36  . Hypertension Father   . Hyperlipidemia Father   . Atrial fibrillation Sister   . ADD / ADHD Son   . Heart attack Maternal Grandfather   . Diabetes Paternal Grandmother   . Hypertension Paternal Grandmother   . Prostate cancer  Paternal Grandfather   . Colon cancer Paternal Grandfather 42  . Polycystic ovary syndrome Sister   . Polycystic ovary syndrome Sister   . Pancreatic cancer Paternal Aunt 110  . Ovarian cancer Neg Hx     Social History Social History   Tobacco Use  . Smoking status: Never Smoker  . Smokeless tobacco: Never Used  Vaping Use  . Vaping Use: Never used  Substance Use Topics  . Alcohol use: No  . Drug use: No     Allergies   Lisinopril   Review of Systems Review of Systems  Constitutional: Negative for fever.  Respiratory: Positive for chest tightness and shortness of breath.    Physical Exam Triage Vital Signs ED Triage Vitals  Enc Vitals Group     BP 05/24/20 1316 (!) 133/92     Pulse Rate 05/24/20 1316 87     Resp 05/24/20 1316 14     Temp 05/24/20 1316 98.6 F (37 C)     Temp Source 05/24/20 1316 Oral     SpO2 05/24/20 1316 99 %     Weight 05/24/20 1313 210 lb (95.3 kg)     Height 05/24/20 1313 '5\' 2"'  (1.575 m)     Head Circumference --      Peak Flow --      Pain Score 05/24/20 1313 6     Pain Loc --      Pain Edu? --      Excl. in [place]? --    Updated Vital Signs BP (!) 133/92 (BP Location: Left Arm)   Pulse 87   Temp 98.6 F (37 C) (Oral)   Resp 14   Ht '5\' 2"'  (1.575 m)   Wt 95.3 kg   LMP 05/24/2020   SpO2 99%   BMI 38.41 kg/m   Visual Acuity Right Eye Distance:   Left Eye Distance:   Bilateral Distance:    Right Eye Near:   Left Eye Near:    Bilateral Near:     Physical Exam Vitals and nursing note reviewed.  Constitutional:      General: She is not in acute distress.    Appearance: Normal appearance. She is not ill-appearing.  HENT:     Head: Normocephalic and atraumatic.  Eyes:     General:        Right eye: No discharge.        Left eye: No discharge.     Conjunctiva/sclera: Conjunctivae normal.  Cardiovascular:     Rate and Rhythm: Normal rate and regular rhythm.  Pulmonary:     Effort: Pulmonary effort is  normal.     Breath  sounds: Normal breath sounds. No wheezing, rhonchi or rales.  Neurological:     Mental Status: She is alert.  Psychiatric:        Mood and Affect: Mood normal.        Behavior: Behavior normal.    UC Treatments / Results  Labs (all labs ordered are listed, but only abnormal results are displayed) Labs Reviewed - No data to display  EKG   Radiology No results found.  Procedures Procedures (including critical care time)  Medications Ordered in UC Medications - No data to display  Initial Impression / Assessment and Plan / UC Course  I have reviewed the triage vital signs and the nursing notes.  Pertinent labs & imaging results that were available during my care of the patient were reviewed by me and considered in my medical decision making (see chart for details).    45 year old female presents with an asthma exacerbation.  Treating with prednisone.  Advised continued use of the Advair, Singulair, albuterol.  Final Clinical Impressions(s) / UC Diagnoses   Final diagnoses:  Exacerbation of persistent asthma, unspecified asthma severity   Discharge Instructions   None    ED Prescriptions    Medication Sig Dispense Auth. Provider   predniSONE (DELTASONE) 50 MG tablet 1 tablet daily x 5 days 5 tablet Thersa Salt G, DO     PDMP not reviewed this encounter.   Coral Spikes, DO 05/24/20 1440

## 2020-05-24 NOTE — ED Triage Notes (Signed)
Patient states that she has asthma.  Patient c/o SOB and tightness in her chest that started on Wed.  Patient has been using her inhalers and nebulizer treatments at home.  Patient denies any cold symptoms.

## 2020-05-25 ENCOUNTER — Telehealth (INDEPENDENT_AMBULATORY_CARE_PROVIDER_SITE_OTHER): Payer: Self-pay | Admitting: Gastroenterology

## 2020-05-25 ENCOUNTER — Other Ambulatory Visit: Payer: Self-pay

## 2020-05-25 DIAGNOSIS — Z1211 Encounter for screening for malignant neoplasm of colon: Secondary | ICD-10-CM

## 2020-05-25 MED ORDER — PEG 3350-KCL-NA BICARB-NACL 420 G PO SOLR: 4000.0000 mL | Freq: Once | ORAL | 0 refills | Status: AC

## 2020-05-25 NOTE — Progress Notes (Signed)
Gastroenterology Pre-Procedure Review  Request Date: Tuesday 06/23/20 Requesting Physician: Dr. Bonna Gains  PATIENT REVIEW QUESTIONS: The patient responded to the following health history questions as indicated:    1. Are you having any GI issues? no 2. Do you have a personal history of Polyps? no 3. Do you have a family history of Colon Cancer or Polyps? yes (dad colon polyps) 4. Diabetes Mellitus? yes (type 2) 5. Joint replacements in the past 12 months?no 6. Major health problems in the past 3 months?no 7. Any artificial heart valves, MVP, or defibrillator?no    MEDICATIONS & ALLERGIES:    Patient reports the following regarding taking any anticoagulation/antiplatelet therapy:   Plavix, Coumadin, Eliquis, Xarelto, Lovenox, Pradaxa, Brilinta, or Effient? no Aspirin? no  Patient confirms/reports the following medications:  Current Outpatient Medications  Medication Sig Dispense Refill  . albuterol (VENTOLIN HFA) 108 (90 Base) MCG/ACT inhaler Inhale 2 puffs into the lungs every 6 (six) hours as needed for wheezing or shortness of breath. May also use 2 puffs prior to exercise for EIB 8 g 5  . Fluticasone-Salmeterol (ADVAIR DISKUS) 100-50 MCG/DOSE AEPB Inhale 1 puff into the lungs 2 (two) times daily. 60 each 3  . losartan-hydrochlorothiazide (HYZAAR) 100-12.5 MG tablet Take 1 tablet by mouth daily. 60 tablet 0  . montelukast (SINGULAIR) 10 MG tablet Take 1 tablet (10 mg total) by mouth at bedtime. 30 tablet 3  . predniSONE (DELTASONE) 50 MG tablet 1 tablet daily x 5 days 5 tablet 0  . rosuvastatin (CRESTOR) 10 MG tablet Take 1 tablet (10 mg total) by mouth daily. 90 tablet 1  . Semaglutide, 1 MG/DOSE, (OZEMPIC, 1 MG/DOSE,) 2 MG/1.5ML SOPN Inject 1 mg into the skin once a week. 6.5 mL 1   No current facility-administered medications for this visit.    Patient confirms/reports the following allergies:  Allergies  Allergen Reactions  . Lisinopril     No orders of the defined types  were placed in this encounter.   AUTHORIZATION INFORMATION Primary Insurance: 1D#: Group #:  Secondary Insurance: 1D#: Group #:  SCHEDULE INFORMATION: Date: [place] 06/23/20 Time: Location:[facility]

## 2020-05-31 ENCOUNTER — Other Ambulatory Visit: Payer: Self-pay | Admitting: Family Medicine

## 2020-06-01 ENCOUNTER — Inpatient Hospital Stay: Payer: 59 | Attending: Hematology and Oncology

## 2020-06-01 ENCOUNTER — Other Ambulatory Visit: Payer: Self-pay | Admitting: Family Medicine

## 2020-06-01 ENCOUNTER — Other Ambulatory Visit: Payer: Self-pay

## 2020-06-01 DIAGNOSIS — D508 Other iron deficiency anemias: Secondary | ICD-10-CM | POA: Diagnosis not present

## 2020-06-01 DIAGNOSIS — D75839 Thrombocytosis, unspecified: Secondary | ICD-10-CM | POA: Diagnosis not present

## 2020-06-01 DIAGNOSIS — D500 Iron deficiency anemia secondary to blood loss (chronic): Secondary | ICD-10-CM

## 2020-06-01 LAB — CBC WITH DIFFERENTIAL/PLATELET
Abs Immature Granulocytes: 0.03 10*3/uL (ref 0.00–0.07)
Basophils Absolute: 0 10*3/uL (ref 0.0–0.1)
Basophils Relative: 1 %
Eosinophils Absolute: 0.2 10*3/uL (ref 0.0–0.5)
Eosinophils Relative: 3 %
HCT: 47.4 % — ABNORMAL HIGH (ref 36.0–46.0)
Hemoglobin: 15.4 g/dL — ABNORMAL HIGH (ref 12.0–15.0)
Immature Granulocytes: 1 %
Lymphocytes Relative: 33 %
Lymphs Abs: 2.1 10*3/uL (ref 0.7–4.0)
MCH: 26.5 pg (ref 26.0–34.0)
MCHC: 32.5 g/dL (ref 30.0–36.0)
MCV: 81.6 fL (ref 80.0–100.0)
Monocytes Absolute: 0.6 10*3/uL (ref 0.1–1.0)
Monocytes Relative: 9 %
Neutro Abs: 3.6 10*3/uL (ref 1.7–7.7)
Neutrophils Relative %: 53 %
Platelets: 444 10*3/uL — ABNORMAL HIGH (ref 150–400)
RBC: 5.81 MIL/uL — ABNORMAL HIGH (ref 3.87–5.11)
RDW: 14.6 % (ref 11.5–15.5)
WBC: 6.6 10*3/uL (ref 4.0–10.5)
nRBC: 0 % (ref 0.0–0.2)

## 2020-06-01 LAB — FERRITIN: Ferritin: 40 ng/mL (ref 11–307)

## 2020-06-01 NOTE — Progress Notes (Signed)
[facility]  [address], [address] [place], [place] [address] Phone: [phone]  Fax: [phone]   Clinic Day:  06/02/2020  Referring physician: Delsa Grana, PA-C  Chief Complaint: Donna Reyes is a 45 y.o. female with iron deficiency anemia andreactivethrombocytosis who is seen for 6 month assessment.   HPI: The patient was last seen in the hematology clinic on 12/03/2019.  At that time, she felt "ok". She was a little bit tired. She denied ice pica and restless legs. She denied any bleeding. Exam revealed no palpable adenopathy. Hematocrit was 41.9, hemoglobin 13.3, platelets 414,000, WBC 5,500. Ferritin was 14. She received Venofer weekly x 2 (12/03/2019 - 12/10/2019).  CBC on 03/03/2020 revealed a hematocrit of 38.4, hemoglobin 12.7, platelets 417,000, WBC 5,300. Ferritin was 10. She received Venofer weekly x 2 (03/10/2020 - 03/17/2020).  Colonoscopy is scheduled for 06/23/2020 by Dr. Bonna Gains.  During the interim, she has been "good." Her energy level is good today. She has light vaginal bleeding from Depo that began in 11/2019. She denies restless legs, ice pica, and any other bleeding.   Past Medical History:  Diagnosis Date  . Allergy   . Anemia   . Asthma   . BRCA negative 10/2019   MyRisk neg; IBIS=7.8%  . Diabetes mellitus without complication ([place])   . Family history of pancreatic cancer   . Hypertension   . Sleep apnea     Past Surgical History:  Procedure Laterality Date  . AXILLARY LYMPH NODE BIOPSY Right 05/25/2017   LYMPHADENITIS. NEGATIVE FOR MALIGNANCY.   Marland Kitchen BREAST BIOPSY Right 05/25/2017   neg/NEGATIVE FOR CARCINOMA. INFLAMMATION AND SQUAMOUS METAPLASIA OF A FOCAL DUCT   . CESAREAN SECTION     times 3  . TUBAL LIGATION      Family History  Problem Relation Age of Onset  . Diabetes Mother   . Heart failure Mother   . Kidney disease Mother   . Diabetes Maternal Grandmother   . Breast cancer Maternal  Grandmother 56  . Hypertension Father   . Hyperlipidemia Father   . Atrial fibrillation Sister   . ADD / ADHD Son   . Heart attack Maternal Grandfather   . Diabetes Paternal Grandmother   . Hypertension Paternal Grandmother   . Prostate cancer Paternal Grandfather   . Colon cancer Paternal Grandfather 36  . Polycystic ovary syndrome Sister   . Polycystic ovary syndrome Sister   . Pancreatic cancer Paternal Aunt 54  . Ovarian cancer Neg Hx     Social History:  reports that she has never smoked. She has never used smokeless tobacco. She reports that she does not drink alcohol and does not use drugs. [place] She is now divorced.She does not smoke or drink. She denies exposure to radiation or toxins. She is an [employer] [facility]. The patient is alone today.  Allergies:  Allergies  Allergen Reactions  . Lisinopril     Current Medications: Current Outpatient Medications  Medication Sig Dispense Refill  . albuterol (VENTOLIN HFA) 108 (90 Base) MCG/ACT inhaler Inhale 2 puffs into the lungs every 6 (six) hours as needed for wheezing or shortness of breath. May also use 2 puffs prior to exercise for EIB 8 g 5  . Fluticasone-Salmeterol (ADVAIR DISKUS) 100-50 MCG/DOSE AEPB Inhale 1 puff into the lungs 2 (two) times daily. 60 each 3  . losartan-hydrochlorothiazide (HYZAAR) 100-12.5 MG tablet TAKE 1 TABLET BY  MOUTH EVERY DAY (STOP LOSARTAN) 90 tablet 0  . montelukast (SINGULAIR) 10 MG tablet Take 1 tablet (10 mg total) by mouth at bedtime. 30 tablet 3  . rosuvastatin (CRESTOR) 10 MG tablet Take 1 tablet (10 mg total) by mouth daily. 90 tablet 1  . Semaglutide, 1 MG/DOSE, (OZEMPIC, 1 MG/DOSE,) 2 MG/1.5ML SOPN Inject 1 mg into the skin once a week. 6.5 mL 1   No current facility-administered medications for this visit.    Review of Systems  Constitutional: Negative for chills, diaphoresis,  fever, malaise/fatigue and weight loss (up 1 lb).       Feels "good."  HENT: Negative.  Negative for congestion, ear discharge, ear pain, hearing loss, nosebleeds, sinus pain, sore throat and tinnitus.   Eyes: Negative.  Negative for blurred vision and double vision.  Respiratory: Negative.  Negative for cough, hemoptysis, sputum production and shortness of breath.   Cardiovascular: Negative.  Negative for chest pain, palpitations and leg swelling.  Gastrointestinal: Negative.  Negative for abdominal pain, blood in stool, constipation, diarrhea, heartburn, melena, nausea and vomiting.       No ice pica. Eating well.  Genitourinary: Negative for dysuria, frequency, hematuria and urgency.       On Depo, spotting since 11/2019  Musculoskeletal: Negative.  Negative for back pain, joint pain, myalgias and neck pain.  Skin: Negative.  Negative for itching and rash.  Neurological: Negative.  Negative for dizziness, sensory change, focal weakness, weakness and headaches.       No restless legs.  Endo/Heme/Allergies: Negative.  Does not bruise/bleed easily.  Psychiatric/Behavioral: Negative.  Negative for depression and memory loss. The patient is not nervous/anxious and does not have insomnia.   All other systems reviewed and are negative.  Performance status (ECOG): 1  Vitals Blood pressure 122/80, pulse 94, temperature 97.9 F (36.6 C), temperature source Tympanic, resp. rate 16, weight 206 lb 10.9 oz (93.8 kg), last menstrual period 05/24/2020, SpO2 100 %.   Physical Exam Vitals and nursing note reviewed.  Constitutional:      General: She is not in acute distress.    Appearance: She is well-developed. She is not diaphoretic.  HENT:     Head: Normocephalic and atraumatic.     Comments: Short styled hair.    Mouth/Throat:     Pharynx: No oropharyngeal exudate.  Eyes:     Conjunctiva/sclera: Conjunctivae normal.     Pupils: Pupils are equal, round, and reactive to light.     Comments:  Contacts.  Neck:     Vascular: No JVD.  Cardiovascular:     Rate and Rhythm: Normal rate and regular rhythm.     Heart sounds: Normal heart sounds. No murmur heard. No friction rub. No gallop.   Pulmonary:     Effort: Pulmonary effort is normal. No respiratory distress.     Breath sounds: Normal breath sounds. No wheezing or rales.  Chest:  Breasts:     Right: No axillary adenopathy or supraclavicular adenopathy.     Left: No axillary adenopathy or supraclavicular adenopathy.    Abdominal:     General: Bowel sounds are normal. There is no distension.     Palpations: Abdomen is soft. There is no hepatomegaly or splenomegaly.     Tenderness: There is no abdominal tenderness. There is no guarding or rebound.  Musculoskeletal:        General: No tenderness. Normal range of motion.     Cervical back: Normal range of motion and neck supple.  Lymphadenopathy:     Head:     Right side of head: No preauricular, posterior auricular or occipital adenopathy.     Left side of head: No preauricular, posterior auricular or occipital adenopathy.     Cervical: No cervical adenopathy.     Upper Body:     Right upper body: No supraclavicular or axillary adenopathy.     Left upper body: No supraclavicular or axillary adenopathy.     Lower Body: No right inguinal adenopathy. No left inguinal adenopathy.  Skin:    General: Skin is warm and dry.     Coloration: Skin is not pale.     Findings: No erythema or rash.  Neurological:     Mental Status: She is alert and oriented to person, place, and time.  Psychiatric:        Behavior: Behavior normal.        Thought Content: Thought content normal.        Judgment: Judgment normal.     Appointment on 06/01/2020  Component Date Value Ref Range Status  . Ferritin 06/01/2020 40  11 - 307 ng/mL Final   Performed at [facility], [address] [place], [place] [address]  . WBC 06/01/2020 6.6  4.0 - 10.5 K/uL Final  . RBC 06/01/2020 5.81*  3.87 - 5.11 MIL/uL Final  . Hemoglobin 06/01/2020 15.4* 12.0 - 15.0 g/dL Final  . HCT 06/01/2020 47.4* 36.0 - 46.0 % Final  . MCV 06/01/2020 81.6  80.0 - 100.0 fL Final  . MCH 06/01/2020 26.5  26.0 - 34.0 pg Final  . MCHC 06/01/2020 32.5  30.0 - 36.0 g/dL Final  . RDW 06/01/2020 14.6  11.5 - 15.5 % Final  . Platelets 06/01/2020 444* 150 - 400 K/uL Final  . nRBC 06/01/2020 0.0  0.0 - 0.2 % Final  . Neutrophils Relative % 06/01/2020 53  % Final  . Neutro Abs 06/01/2020 3.6  1.7 - 7.7 K/uL Final  . Lymphocytes Relative 06/01/2020 33  % Final  . Lymphs Abs 06/01/2020 2.1  0.7 - 4.0 K/uL Final  . Monocytes Relative 06/01/2020 9  % Final  . Monocytes Absolute 06/01/2020 0.6  0.1 - 1.0 K/uL Final  . Eosinophils Relative 06/01/2020 3  % Final  . Eosinophils Absolute 06/01/2020 0.2  0.0 - 0.5 K/uL Final  . Basophils Relative 06/01/2020 1  % Final  . Basophils Absolute 06/01/2020 0.0  0.0 - 0.1 K/uL Final  . Immature Granulocytes 06/01/2020 1  % Final  . Abs Immature Granulocytes 06/01/2020 0.03  0.00 - 0.07 K/uL Final   Performed at [facility], [address], [place], [place] [address]    Assessment:  Jossalin Chervenak is a 44 y.o. female withiron deficiency anemiaand reactive thrombocytosis. Etiology is felt secondary to menorrhagia. Dietis fairly good.Sheis intolerant oforal iron.  She has a history of an [place] a young age.She [place]in [place]. Report is unavailable.She is no longer on a PPI.  Work-up on09/27/2017revealed a hematocrit of 27.4, hemoglobin 8.0, MCV 60.3, platelets391,000, and WBC 4400.Ferritinwas 4 with an iron saturation of 2% and a TIBC of 499. Normal studies included:B12,folate,Coom[place],ANA, and hemoglobin electrophoresis.  Von Willebrand testing was normal on 11/06/2018.  She [place]. She received Gustavo Lah 01/08/2016, 01/15/2016, 11/10/2017, 11/17/2017, and  01/05/2018.  She received Venofer x 3 (11/06/2018 - 11/23/2018), x 3 (05/09/2019 - 06/06/2019), 10/15/2019, x 2 (12/03/2019 - 12/10/2019), and x 2 (03/10/2020 - 03/17/2020).  Ferritinhas been followed: 4 on 01/01/2016, 33 on 02/29/2016, 4 on 10/31/2017, 16 on 01/04/2018, 9 on 06/11/2018,6on 08/13/2018, 9 on 11/05/2018, 5 on 05/08/2019, 11 on 08/06/2019, 6 on 10/10/2019, 14 on 11/26/2019, 10 on 03/03/2020 and 40 on 06/01/2020.   Shehas a history of right breast lump and adenopathyin 2019. Biopsies on 02/21/2019werenegative for malignancy.Pathology revealedlymphadenitis.  Symptomatically,  she feels "good."  She describes light vaginal bleeding. She denies restless legs, ice pica, and any other bleeding.  Exam is stable.  Plan: 1.   Review labs from 06/01/2020. 2. Iron deficiency anemia Hematocrit 47.4.  Hemoglobin 15.4.  MCV 81.6 on 06/01/2020.               Ferritin 40.    Ferritin goal 100. Heavy menses controlled with Depo-Provera. No Venofer today. 3.   Bleeding diathesis Patient with heavy menses. Von Willebrand testing was negative.   Patient on Depo-Provera with resolution of heavy menses 4.   No Venofer today. 5.   RTC in 3 months for labs (CBC, ferritin). 6.   RTC in 6 months for MD assessment, labs (CBC with diff, ferritin- day before) and +/- Venofer.  I discussed the assessment and treatment plan with the patient.  The patient was provided an opportunity to ask questions and all were answered.  The patient agreed with the plan and demonstrated an understanding of the instructions.  The patient was advised to call back if the symptoms worsen or if the condition fails to improve as anticipated.   Donna Asal, MD, PhD 06/02/2020, 2:07 PM  I, Mirian Mo Tufford, am acting as Education administrator for [employer]. Mike Gip, MD, PhD.  I, Maxine Fredman C. Mike Gip, MD, have reviewed the above documentation for accuracy and  completeness, and I agree with the above.

## 2020-06-02 ENCOUNTER — Inpatient Hospital Stay: Payer: 59 | Attending: Hematology and Oncology | Admitting: Hematology and Oncology

## 2020-06-02 ENCOUNTER — Encounter: Payer: Self-pay | Admitting: Hematology and Oncology

## 2020-06-02 ENCOUNTER — Inpatient Hospital Stay: Payer: 59

## 2020-06-02 VITALS — BP 122/80 | HR 94 | Temp 97.9°F | Resp 16 | Wt 206.7 lb

## 2020-06-02 DIAGNOSIS — E119 Type 2 diabetes mellitus without complications: Secondary | ICD-10-CM | POA: Insufficient documentation

## 2020-06-02 DIAGNOSIS — Z79899 Other long term (current) drug therapy: Secondary | ICD-10-CM | POA: Insufficient documentation

## 2020-06-02 DIAGNOSIS — Z7951 Long term (current) use of inhaled steroids: Secondary | ICD-10-CM | POA: Insufficient documentation

## 2020-06-02 DIAGNOSIS — D75838 Other thrombocytosis: Secondary | ICD-10-CM | POA: Insufficient documentation

## 2020-06-02 DIAGNOSIS — N920 Excessive and frequent menstruation with regular cycle: Secondary | ICD-10-CM | POA: Diagnosis not present

## 2020-06-02 DIAGNOSIS — J45909 Unspecified asthma, uncomplicated: Secondary | ICD-10-CM | POA: Diagnosis not present

## 2020-06-02 DIAGNOSIS — Z800 Family history of malignant neoplasm of digestive organs: Secondary | ICD-10-CM | POA: Insufficient documentation

## 2020-06-02 DIAGNOSIS — I10 Essential (primary) hypertension: Secondary | ICD-10-CM | POA: Insufficient documentation

## 2020-06-02 DIAGNOSIS — Z8349 Family history of other endocrine, nutritional and metabolic diseases: Secondary | ICD-10-CM | POA: Insufficient documentation

## 2020-06-02 DIAGNOSIS — Z803 Family history of malignant neoplasm of breast: Secondary | ICD-10-CM | POA: Diagnosis not present

## 2020-06-02 DIAGNOSIS — Z833 Family history of diabetes mellitus: Secondary | ICD-10-CM | POA: Diagnosis not present

## 2020-06-02 DIAGNOSIS — Z8249 Family history of ischemic heart disease and other diseases of the circulatory system: Secondary | ICD-10-CM | POA: Diagnosis not present

## 2020-06-02 DIAGNOSIS — D500 Iron deficiency anemia secondary to blood loss (chronic): Secondary | ICD-10-CM | POA: Diagnosis not present

## 2020-06-02 DIAGNOSIS — G4730 Sleep apnea, unspecified: Secondary | ICD-10-CM | POA: Insufficient documentation

## 2020-06-15 ENCOUNTER — Other Ambulatory Visit: Payer: Self-pay | Admitting: Family Medicine

## 2020-06-15 ENCOUNTER — Encounter: Payer: Self-pay | Admitting: Family Medicine

## 2020-06-15 DIAGNOSIS — R928 Other abnormal and inconclusive findings on diagnostic imaging of breast: Secondary | ICD-10-CM

## 2020-06-16 ENCOUNTER — Other Ambulatory Visit: Payer: Self-pay

## 2020-06-16 ENCOUNTER — Encounter: Payer: Self-pay | Admitting: Gastroenterology

## 2020-06-19 ENCOUNTER — Other Ambulatory Visit: Payer: Self-pay

## 2020-06-19 ENCOUNTER — Other Ambulatory Visit
Admission: RE | Admit: 2020-06-19 | Discharge: 2020-06-19 | Disposition: A | Discharge status: Home / Self Care | Payer: 59 | Source: Ambulatory Visit | Attending: Gastroenterology | Admitting: Gastroenterology

## 2020-06-19 DIAGNOSIS — Z01812 Encounter for preprocedural laboratory examination: Secondary | ICD-10-CM | POA: Insufficient documentation

## 2020-06-19 DIAGNOSIS — Z20822 Contact with and (suspected) exposure to covid-19: Secondary | ICD-10-CM | POA: Diagnosis not present

## 2020-06-19 LAB — SARS CORONAVIRUS 2 (TAT 6-24 HRS): SARS Coronavirus 2: NEGATIVE

## 2020-06-22 NOTE — Discharge Instructions (Signed)

## 2020-06-23 ENCOUNTER — Other Ambulatory Visit: Payer: Self-pay

## 2020-06-23 ENCOUNTER — Ambulatory Visit: Payer: 59 | Admitting: Anesthesiology

## 2020-06-23 ENCOUNTER — Ambulatory Visit
Admission: RE | Admit: 2020-06-23 | Discharge: 2020-06-23 | Disposition: A | Discharge status: Home / Self Care | Payer: 59 | Attending: Gastroenterology | Admitting: Gastroenterology

## 2020-06-23 ENCOUNTER — Encounter: Payer: Self-pay | Admitting: Gastroenterology

## 2020-06-23 ENCOUNTER — Encounter
Admission: RE | Disposition: A | Discharge status: Home / Self Care | Payer: Self-pay | Source: Home / Self Care | Attending: Gastroenterology

## 2020-06-23 DIAGNOSIS — Z833 Family history of diabetes mellitus: Secondary | ICD-10-CM | POA: Diagnosis not present

## 2020-06-23 DIAGNOSIS — Z79899 Other long term (current) drug therapy: Secondary | ICD-10-CM | POA: Diagnosis not present

## 2020-06-23 DIAGNOSIS — Z803 Family history of malignant neoplasm of breast: Secondary | ICD-10-CM | POA: Insufficient documentation

## 2020-06-23 DIAGNOSIS — E119 Type 2 diabetes mellitus without complications: Secondary | ICD-10-CM | POA: Diagnosis not present

## 2020-06-23 DIAGNOSIS — Z7951 Long term (current) use of inhaled steroids: Secondary | ICD-10-CM | POA: Insufficient documentation

## 2020-06-23 DIAGNOSIS — Z8249 Family history of ischemic heart disease and other diseases of the circulatory system: Secondary | ICD-10-CM | POA: Diagnosis not present

## 2020-06-23 DIAGNOSIS — K6389 Other specified diseases of intestine: Secondary | ICD-10-CM | POA: Insufficient documentation

## 2020-06-23 DIAGNOSIS — Z8349 Family history of other endocrine, nutritional and metabolic diseases: Secondary | ICD-10-CM | POA: Diagnosis not present

## 2020-06-23 DIAGNOSIS — Z1211 Encounter for screening for malignant neoplasm of colon: Secondary | ICD-10-CM | POA: Diagnosis not present

## 2020-06-23 DIAGNOSIS — Z8042 Family history of malignant neoplasm of prostate: Secondary | ICD-10-CM | POA: Insufficient documentation

## 2020-06-23 DIAGNOSIS — Z800 Family history of malignant neoplasm of digestive organs: Secondary | ICD-10-CM | POA: Diagnosis not present

## 2020-06-23 DIAGNOSIS — K648 Other hemorrhoids: Secondary | ICD-10-CM | POA: Diagnosis not present

## 2020-06-23 HISTORY — DX: Family history of other specified conditions: Z84.89

## 2020-06-23 HISTORY — PX: COLONOSCOPY WITH PROPOFOL: SHX5780

## 2020-06-23 HISTORY — DX: Presence of spectacles and contact lenses: Z97.3

## 2020-06-23 LAB — GLUCOSE, CAPILLARY
Glucose-Capillary: 104 mg/dL — ABNORMAL HIGH (ref 70–99)
Glucose-Capillary: 106 mg/dL — ABNORMAL HIGH (ref 70–99)

## 2020-06-23 LAB — POCT PREGNANCY, URINE: Preg Test, Ur: NEGATIVE

## 2020-06-23 SURGERY — COLONOSCOPY WITH PROPOFOL
Anesthesia: General | Site: Rectum

## 2020-06-23 MED ORDER — SODIUM CHLORIDE 0.9 % IV SOLN: INTRAVENOUS | Status: DC

## 2020-06-23 MED ORDER — PROPOFOL 10 MG/ML IV BOLUS
INTRAVENOUS | Status: DC | PRN
  Administered 2020-06-23: 30 mg via INTRAVENOUS
  Administered 2020-06-23: 20 mg via INTRAVENOUS
  Administered 2020-06-23: 40 mg via INTRAVENOUS
  Administered 2020-06-23 (×3): 30 mg via INTRAVENOUS
  Administered 2020-06-23: 20 mg via INTRAVENOUS
  Administered 2020-06-23 (×2): 30 mg via INTRAVENOUS
  Administered 2020-06-23: 80 mg via INTRAVENOUS
  Administered 2020-06-23: 30 mg via INTRAVENOUS
  Administered 2020-06-23: 40 mg via INTRAVENOUS
  Administered 2020-06-23: 20 mg via INTRAVENOUS
  Administered 2020-06-23: 30 mg via INTRAVENOUS
  Administered 2020-06-23 (×2): 40 mg via INTRAVENOUS
  Administered 2020-06-23: 30 mg via INTRAVENOUS
  Administered 2020-06-23: 20 mg via INTRAVENOUS
  Administered 2020-06-23: 40 mg via INTRAVENOUS

## 2020-06-23 MED ORDER — STERILE WATER FOR IRRIGATION IR SOLN
Status: DC | PRN
  Administered 2020-06-23: 75 mL

## 2020-06-23 MED ORDER — LIDOCAINE HCL (CARDIAC) PF 100 MG/5ML IV SOSY
PREFILLED_SYRINGE | INTRAVENOUS | Status: DC | PRN
  Administered 2020-06-23: 50 mg via INTRAVENOUS

## 2020-06-23 MED ORDER — LACTATED RINGERS IV SOLN: INTRAVENOUS | Status: DC

## 2020-06-23 SURGICAL SUPPLY — 25 items
CLIP HMST 235XBRD CATH ROT (MISCELLANEOUS) IMPLANT
CLIP RESOLUTION 360 11X235 (MISCELLANEOUS)
ELECT REM PT RETURN 9FT ADLT (ELECTROSURGICAL)
ELECTRODE REM PT RTRN 9FT ADLT (ELECTROSURGICAL) IMPLANT
FCP ESCP3.2XJMB 240X2.8X (MISCELLANEOUS)
FORCEPS BIOP RAD 4 LRG CAP 4 (CUTTING FORCEPS) IMPLANT
FORCEPS BIOP RJ4 240 W/NDL (MISCELLANEOUS)
FORCEPS ESCP3.2XJMB 240X2.8X (MISCELLANEOUS) IMPLANT
GOWN CVR UNV OPN BCK APRN NK (MISCELLANEOUS) ×2 IMPLANT
GOWN ISOL THUMB LOOP REG UNIV (MISCELLANEOUS) ×4
INJECTOR VARIJECT VIN23 (MISCELLANEOUS) IMPLANT
KIT DEFENDO VALVE AND CONN (KITS) IMPLANT
KIT PRC NS LF DISP ENDO (KITS) ×1 IMPLANT
KIT PROCEDURE OLYMPUS (KITS) ×2
MANIFOLD NEPTUNE II (INSTRUMENTS) ×2 IMPLANT
MARKER SPOT ENDO TATTOO 5ML (MISCELLANEOUS) IMPLANT
PROBE APC STR FIRE (PROBE) IMPLANT
RETRIEVER NET ROTH 2.5X230 LF (MISCELLANEOUS) IMPLANT
SNARE SHORT THROW 13M SML OVAL (MISCELLANEOUS) IMPLANT
SNARE SHORT THROW 30M LRG OVAL (MISCELLANEOUS) IMPLANT
SNARE SNG USE RND 15MM (INSTRUMENTS) IMPLANT
SPOT EX ENDOSCOPIC TATTOO (MISCELLANEOUS)
TRAP ETRAP POLY (MISCELLANEOUS) IMPLANT
VARIJECT INJECTOR VIN23 (MISCELLANEOUS)
WATER STERILE IRR 250ML POUR (IV SOLUTION) ×2 IMPLANT

## 2020-06-23 NOTE — Op Note (Addendum)
[facility]ology Patient Name: Donna Reyes Procedure Date: 06/23/2020 9:33 AM MRN: [id number] Account #: [id number] Date of Birth: 06-07-1975 Admit Type: Outpatient Age: 45 Room: [facility] OR ROOM 01 Gender: Female Note Status: Finalized Procedure:             Colonoscopy Indications:           Screening for colorectal malignant neoplasm Providers:             Nisha Dhami B. Bonna Gains MD, MD Referring MD:          Delsa Grana (Referring MD) Medicines:             Monitored Anesthesia Care Complications:         No immediate complications. Procedure:             Pre-Anesthesia Assessment:                        - Prior to the procedure, a History and Physical was                         performed, and patient medications, allergies and                         sensitivities were reviewed. The patient's tolerance                         of previous anesthesia was reviewed.                        - The risks and benefits of the procedure and the                         sedation options and risks were discussed with the                         patient. All questions were answered and informed                         consent was obtained.                        - Patient identification and proposed procedure were                         verified prior to the procedure by the physician, the                         nurse, the anesthetist and the technician. The                         procedure was verified in the pre-procedure area in                         the procedure room in the endoscopy suite.                        - ASA Grade Assessment: II - A patient with mild  systemic disease.                        - After reviewing the risks and benefits, the patient                         was deemed in satisfactory condition to undergo the                         procedure.                        After obtaining informed consent, the  colonoscope was                         passed under direct vision. Throughout the procedure,                         the patient's blood pressure, pulse, and oxygen                         saturations were monitored continuously. The                         Colonoscope was introduced through the anus and                         advanced to the the cecum, identified by appendiceal                         orifice and ileocecal valve. The colonoscopy was                         performed with ease. The patient tolerated the                         procedure well. The quality of the bowel preparation                         was good. Findings:      The perianal and digital rectal examinations were normal.      The colon (entire examined portion) was tortuous. Advancing the scope       required applying abdominal pressure.      The rectum, sigmoid colon, descending colon, transverse colon, ascending       colon and cecum appeared normal.      Non-bleeding internal hemorrhoids were found during retroflexion.      No additional abnormalities were found on retroflexion. Impression:            - Tortuous colon.                        - The rectum, sigmoid colon, descending colon,                         transverse colon, ascending colon and cecum are normal.                        - Non-bleeding internal hemorrhoids.                        -  No specimens collected. Recommendation:        - Discharge patient to home.                        - Resume previous diet.                        - Continue present medications.                        - Repeat colonoscopy in 10 years for screening                         purposes.                        - Return to primary care physician as previously                         scheduled.                        - The findings and recommendations were discussed with                         the patient.                        - The findings and recommendations  were discussed with                         the patient's family.                        - High fiber diet.                        - In the future, if patient develops new symptoms such                         as blood per rectum, abdominal pain, weight loss,                         altered bowel habits or any other reason for concern,                         patient should discuss this with thier PCP as they may                         need a GI referral at that time or evaluation for need                         for colonoscopy earlier than the recommended screening                         colonoscopy.                        In addition, if patient's family history of colon  cancer changes (no family history at this time) in the                         future, earlier screening may be indicated and patient                         should discuss this with PCP as well. Procedure Code(s):     --- Professional ---                        [phone], Colonoscopy, flexible; diagnostic, including                         collection of specimen(s) by brushing or washing, when                         performed (separate procedure) Diagnosis Code(s):     --- Professional ---                        Z12.11, Encounter for screening for malignant neoplasm                         of colon CPT copyright 2019 American Medical Association. All rights reserved. The codes documented in this report are preliminary and upon coder review may  be revised to meet current compliance requirements.  Vonda Antigua, MD Margretta Sidle B. Bonna Gains MD, MD 06/23/2020 10:50:32 AM This report has been signed electronically. Number of Addenda: 0 Note Initiated On: 06/23/2020 9:33 AM Scope Withdrawal Time: 0 hours 13 minutes 44 seconds  Total Procedure Duration: 0 hours 29 minutes 0 seconds       [facility]

## 2020-06-23 NOTE — Transfer of Care (Signed)
Immediate Anesthesia Transfer of Care Note  Patient: Donna Reyes  Procedure(s) Performed: COLONOSCOPY WITH PROPOFOL (N/A Rectum)  Patient Location: PACU  Anesthesia Type: General  Level of Consciousness: awake, alert  and patient cooperative  Airway and Oxygen Therapy: Patient Spontanous Breathing and Patient connected to supplemental oxygen  Post-op Assessment: Post-op Vital signs reviewed, Patient's Cardiovascular Status Stable, Respiratory Function Stable, Patent Airway and No signs of Nausea or vomiting  Post-op Vital Signs: Reviewed and stable  Complications: No complications documented.

## 2020-06-23 NOTE — Anesthesia Preprocedure Evaluation (Signed)
Anesthesia Evaluation  Patient identified by MRN, date of birth, ID band Patient awake    Reviewed: NPO status   History of Anesthesia Complications Negative for: history of anesthetic complications  Airway Mallampati: II  TM Distance: >3 FB Neck ROM: Full    Dental   Pulmonary asthma , sleep apnea ,    Pulmonary exam normal        Cardiovascular hypertension, Normal cardiovascular exam     Neuro/Psych Anxiety    GI/Hepatic   Endo/Other  diabetes  Renal/GU      Musculoskeletal   Abdominal   Peds  Hematology   Anesthesia Other Findings   Reproductive/Obstetrics                             Anesthesia Physical Anesthesia Plan  ASA: II  Anesthesia Plan: General   Post-op Pain Management:    Induction: Intravenous  PONV Risk Score and Plan: Ondansetron  Airway Management Planned: Simple Face Mask  Additional Equipment:   Intra-op Plan:   Post-operative Plan:   Informed Consent: I have reviewed the patients History and Physical, chart, labs and discussed the procedure including the risks, benefits and alternatives for the proposed anesthesia with the patient or authorized representative who has indicated his/her understanding and acceptance.       Plan Discussed with: CRNA  Anesthesia Plan Comments:         Anesthesia Quick Evaluation

## 2020-06-23 NOTE — Anesthesia Procedure Notes (Signed)
Performed by: Yeiren [place]cot[place], CRNA Pre-anesthesia Checklist: Patient identified, Emergency Drugs available, Suction available, Timeout performed and Patient being monitored Patient Re-evaluated:Patient Re-evaluated prior to induction Oxygen Delivery Method: Nasal cannula Placement Confirmation: positive ETCO2       

## 2020-06-23 NOTE — H&P (Signed)
Donna Antigua, MD [address] [place], [place], [place], [address] [address], [place], [place], [place], [address] Phone: [phone]  Fax: [phone]  Primary Care Physician:  Delsa Grana, PA-C   Pre-Procedure History & Physical: HPI:  Donna Reyes is a 45 y.o. female is here for a colonoscopy.   Past Medical History:  Diagnosis Date  . Allergy   . Anemia   . Asthma   . BRCA negative 10/2019   MyRisk neg; IBIS=7.8%  . Diabetes mellitus without complication ([place])    type 2  . Family history of adverse reaction to anesthesia    Father - slow to wake  . Family history of pancreatic cancer   . Hypertension   . Sleep apnea   . Wears contact lenses     Past Surgical History:  Procedure Laterality Date  . AXILLARY LYMPH NODE BIOPSY Right 05/25/2017   LYMPHADENITIS. NEGATIVE FOR MALIGNANCY.   Marland Kitchen BREAST BIOPSY Right 05/25/2017   neg/NEGATIVE FOR CARCINOMA. INFLAMMATION AND SQUAMOUS METAPLASIA OF A FOCAL DUCT   . CESAREAN SECTION     times 3  . TUBAL LIGATION      Prior to Admission medications   Medication Sig Start Date End Date Taking? Authorizing Provider  albuterol (VENTOLIN HFA) 108 (90 Base) MCG/ACT inhaler Inhale 2 puffs into the lungs every 6 (six) hours as needed for wheezing or shortness of breath. May also use 2 puffs prior to exercise for EIB 03/31/20  Yes Delsa Grana, PA-C  Fluticasone-Salmeterol (ADVAIR DISKUS) 100-50 MCG/DOSE AEPB Inhale 1 puff into the lungs 2 (two) times daily. 03/31/20  Yes Delsa Grana, PA-C  losartan-hydrochlorothiazide (HYZAAR) 100-12.5 MG tablet TAKE 1 TABLET BY MOUTH EVERY DAY (STOP LOSARTAN) 06/01/20  Yes Delsa Grana, PA-C  montelukast (SINGULAIR) 10 MG tablet Take 1 tablet (10 mg total) by mouth at bedtime. 03/31/20  Yes Delsa Grana, PA-C  rosuvastatin (CRESTOR) 10 MG tablet Take 1 tablet (10 mg total) by mouth daily. 03/10/20  Yes Towanda Malkin, MD  Semaglutide, 1 MG/DOSE, (OZEMPIC, 1 MG/DOSE,) 2  MG/1.5ML SOPN Inject 1 mg into the skin once a week. 03/12/20  Yes Delsa Grana, PA-C    Allergies as of 05/25/2020 - Review Complete 05/25/2020  Allergen Reaction Noted  . Lisinopril  08/08/2015    Family History  Problem Relation Age of Onset  . Diabetes Mother   . Heart failure Mother   . Kidney disease Mother   . Diabetes Maternal Grandmother   . Breast cancer Maternal Grandmother 39  . Hypertension Father   . Hyperlipidemia Father   . Atrial fibrillation Sister   . ADD / ADHD Son   . Heart attack Maternal Grandfather   . Diabetes Paternal Grandmother   . Hypertension Paternal Grandmother   . Prostate cancer Paternal Grandfather   . Colon cancer Paternal Grandfather 71  . Polycystic ovary syndrome Sister   . Polycystic ovary syndrome Sister   . Pancreatic cancer Paternal Aunt 39  . Ovarian cancer Neg Hx     Social History   Socioeconomic History  . Marital status: Married    Spouse name: Not on file  . Number of children: 5  . Years of education: 59  . Highest education level: Associate degree: academic program  Occupational History  . Not on file  Tobacco Use  . Smoking status: Never Smoker  . Smokeless tobacco: Never Used  Vaping Use  . Vaping Use: Never used  Substance and Sexual Activity  .  Alcohol use: No  . Drug use: No  . Sexual activity: Yes    Partners: Male    Birth control/protection: Surgical    Comment: tubal ligation   Other Topics Concern  . Not on file  Social History Narrative  . Not on file   Social Determinants of Health   Financial Resource Strain: Not on file  Food Insecurity: Not on file  Transportation Needs: Not on file  Physical Activity: Not on file  Stress: Not on file  Social Connections: Not on file  Intimate Partner Violence: Not on file    Review of Systems: See HPI, otherwise negative ROS  Physical Exam: BP 115/81   Pulse 81   Temp 98.2 F (36.8 C) (Temporal)   Ht '5\' 2"'  (1.575 m)   Wt 94.3 kg   LMP  05/24/2020   SpO2 97%   BMI 38.04 kg/m  General:   Alert,  pleasant and cooperative in NAD Head:  Normocephalic and atraumatic. Neck:  Supple; no masses or thyromegaly. Lungs:  Clear throughout to auscultation, normal respiratory effort.    Heart:  +S1, +S2, Regular rate and rhythm, No edema. Abdomen:  Soft, nontender and nondistended. Normal bowel sounds, without guarding, and without rebound.   Neurologic:  Alert and  oriented x4;  grossly normal neurologically.  Impression/Plan: Donna Reyes is here for a colonoscopy to be performed for average risk screening.  Risks, benefits, limitations, and alternatives regarding  colonoscopy have been reviewed with the patient.  Questions have been answered.  All parties agreeable.   Virgel Manifold, MD  06/23/2020, 9:57 AM

## 2020-06-23 NOTE — Anesthesia Postprocedure Evaluation (Signed)
Anesthesia Post Note  Patient: Donna Reyes  Procedure(s) Performed: COLONOSCOPY WITH PROPOFOL (N/A Rectum)     Patient location during evaluation: PACU Anesthesia Type: General Level of consciousness: awake and alert Pain management: pain level controlled Vital Signs Assessment: post-procedure vital signs reviewed and stable Respiratory status: spontaneous breathing, nonlabored ventilation and respiratory function stable Cardiovascular status: blood pressure returned to baseline and stable Postop Assessment: no apparent nausea or vomiting Anesthetic complications: no   No complications documented.  Wanda Plump Deidra Spease

## 2020-06-24 ENCOUNTER — Encounter: Payer: Self-pay | Admitting: Gastroenterology

## 2020-06-29 ENCOUNTER — Other Ambulatory Visit: Payer: Self-pay

## 2020-06-29 ENCOUNTER — Ambulatory Visit
Admission: RE | Admit: 2020-06-29 | Discharge: 2020-06-29 | Disposition: A | Discharge status: Home / Self Care | Payer: 59 | Source: Ambulatory Visit | Attending: Internal Medicine | Admitting: Internal Medicine

## 2020-06-29 VITALS — BP 154/100 | HR 77 | Temp 98.6°F | Resp 18 | Ht 62.0 in | Wt 206.0 lb

## 2020-06-29 DIAGNOSIS — S00512A Abrasion of oral cavity, initial encounter: Secondary | ICD-10-CM | POA: Diagnosis not present

## 2020-06-29 MED ORDER — FLUCONAZOLE 150 MG PO TABS: 150.0000 mg | ORAL_TABLET | Freq: Every day | ORAL | 0 refills | Status: DC

## 2020-06-29 MED ORDER — LIDOCAINE HCL 2 % EX GEL: CUTANEOUS | 0 refills | Status: DC

## 2020-06-29 MED ORDER — PENICILLIN V POTASSIUM 500 MG PO TABS: ORAL_TABLET | ORAL | 0 refills | Status: DC

## 2020-06-29 NOTE — ED Provider Notes (Signed)
[facility]    CSN: [id number] Arrival date & time: 06/29/20  1817      History   Chief Complaint Chief Complaint  Patient presents with  . Mouth Lesions    HPI Donna Reyes is a 45 y.o. female with L soft palate scrape when she was getting intubated to have a colonoscopy. She recalls it, but has been awating to heal for 6 days  and the pain is getting worse. Denies fever. Has been rinsing her mouth with Listerine and salt water.     Past Medical History:  Diagnosis Date  . Allergy   . Anemia   . Asthma   . BRCA negative 10/2019   MyRisk neg; IBIS=7.8%  . Diabetes mellitus without complication ([place])    type 2  . Family history of adverse reaction to anesthesia    Father - slow to wake  . Family history of pancreatic cancer   . Hypertension   . Sleep apnea   . Wears contact lenses     Patient Active Problem List   Diagnosis Date Noted  . Encounter for screening colonoscopy   . Obesity due to excess calories with serious comorbidity 03/10/2020  . Hyperlipidemia associated with type 2 diabetes mellitus ([place]) 03/10/2020  . Abscess of ear canal, right 12/12/2019  . Family history of pancreatic cancer 10/10/2019  . Right ovarian cyst 10/10/2019  . Anxiety 03/05/2019  . Controlled type 2 diabetes mellitus with skin complication ([place]) 57/04/7791  . Palpable lymph node 08/17/2018  . Menorrhagia with regular cycle 08/14/2018  . Lymphadenopathy, axillary 10/27/2017  . Anemia 10/27/2017  . Chest pain 01/06/2016  . Essential hypertension 01/06/2016  . Mild intermittent asthma with acute exacerbation 01/06/2016  . Iron deficiency anemia 12/30/2015    Past Surgical History:  Procedure Laterality Date  . AXILLARY LYMPH NODE BIOPSY Right 05/25/2017   LYMPHADENITIS. NEGATIVE FOR MALIGNANCY.   Marland Kitchen BREAST BIOPSY Right 05/25/2017   neg/NEGATIVE FOR CARCINOMA. INFLAMMATION AND SQUAMOUS METAPLASIA OF A FOCAL DUCT   . CESAREAN SECTION     times 3  .  COLONOSCOPY WITH PROPOFOL N/A 06/23/2020   Procedure: COLONOSCOPY WITH PROPOFOL;  Surgeon: Virgel Manifold, MD;  Location: [facility];  Service: Endoscopy;  Laterality: N/A;  Diabetic  sleep apnea priority 4  . TUBAL LIGATION      OB History    Gravida  5   Para  5   Term  5   Preterm      AB      Living  5     SAB      IAB      Ectopic      Multiple      Live Births  5        Obstetric Comments  1st Menstrual Cycle:  13 1st Pregnancy:  14          Home Medications    Prior to Admission medications   Medication Sig Start Date End Date Taking? Authorizing Provider  albuterol (VENTOLIN HFA) 108 (90 Base) MCG/ACT inhaler Inhale 2 puffs into the lungs every 6 (six) hours as needed for wheezing or shortness of breath. May also use 2 puffs prior to exercise for EIB 03/31/20  Yes Delsa Grana, PA-C  fluconazole (DIFLUCAN) 150 MG tablet Take 1 tablet (150 mg total) by mouth daily. 06/29/20  Yes Rodriguez-Southworth, Sunday Spillers, PA-C  Fluticasone-Salmeterol (ADVAIR DISKUS) 100-50 MCG/DOSE AEPB Inhale 1 puff into the lungs 2 (two) times  daily. 03/31/20  Yes Delsa Grana, PA-C  lidocaine (XYLOCAINE) 2 % jelly With a Qtip apply to oral sore before eating 06/29/20  Yes Rodriguez-Southworth, Sunday Spillers, PA-C  losartan-hydrochlorothiazide (HYZAAR) 100-12.5 MG tablet TAKE 1 TABLET BY MOUTH EVERY DAY (STOP LOSARTAN) 06/01/20  Yes Delsa Grana, PA-C  montelukast (SINGULAIR) 10 MG tablet Take 1 tablet (10 mg total) by mouth at bedtime. 03/31/20  Yes Delsa Grana, PA-C  penicillin v potassium (VEETID) 500 MG tablet 2 bid x 7 days 06/29/20  Yes Rodriguez-Southworth, Sunday Spillers, PA-C  rosuvastatin (CRESTOR) 10 MG tablet Take 1 tablet (10 mg total) by mouth daily. 03/10/20  Yes Towanda Malkin, MD  Semaglutide, 1 MG/DOSE, (OZEMPIC, 1 MG/DOSE,) 2 MG/1.5ML SOPN Inject 1 mg into the skin once a week. 03/12/20  Yes Delsa Grana, PA-C    Family History Family History  Problem  Relation Age of Onset  . Diabetes Mother   . Heart failure Mother   . Kidney disease Mother   . Diabetes Maternal Grandmother   . Breast cancer Maternal Grandmother 44  . Hypertension Father   . Hyperlipidemia Father   . Atrial fibrillation Sister   . ADD / ADHD Son   . Heart attack Maternal Grandfather   . Diabetes Paternal Grandmother   . Hypertension Paternal Grandmother   . Prostate cancer Paternal Grandfather   . Colon cancer Paternal Grandfather 72  . Polycystic ovary syndrome Sister   . Polycystic ovary syndrome Sister   . Pancreatic cancer Paternal Aunt 62  . Ovarian cancer Neg Hx     Social History Social History   Tobacco Use  . Smoking status: Never Smoker  . Smokeless tobacco: Never Used  Vaping Use  . Vaping Use: Never used  Substance Use Topics  . Alcohol use: No  . Drug use: No     Allergies   Capsicum, Basil oil, and Lisinopril   Review of Systems Review of Systems   Physical Exam Triage Vital Signs ED Triage Vitals  Enc Vitals Group     BP 06/29/20 1828 (!) 154/100     Pulse Rate 06/29/20 1828 77     Resp 06/29/20 1828 18     Temp 06/29/20 1828 98.6 F (37 C)     Temp Source 06/29/20 1828 Oral     SpO2 06/29/20 1828 100 %     Weight 06/29/20 1827 206 lb (93.4 kg)     Height 06/29/20 1827 5' 2" (1.575 m)     Head Circumference --      Peak Flow --      Pain Score 06/29/20 1827 6     Pain Loc --      Pain Edu? --      Excl. in [place]? --    No data found.  Updated Vital Signs BP (!) 154/100 (BP Location: Right Arm) Comment: patient has not taken BP medication for today  Pulse 77   Temp 98.6 F (37 C) (Oral)   Resp 18   Ht 5' 2" (1.575 m)   Wt 206 lb (93.4 kg)   SpO2 100%   BMI 37.68 kg/m   Visual Acuity Right Eye Distance:   Left Eye Distance:   Bilateral Distance:    Right Eye Near:   Left Eye Near:    Bilateral Near:     Physical Exam Constitutional:      General: She is not in acute distress.    Appearance: Normal  appearance. She is obese. She is not toxic-appearing.  HENT:     Head: Normocephalic.     Right Ear: External ear normal.     Left Ear: External ear normal.     Mouth/Throat:     Pharynx: Oropharynx is clear.     Comments: Hard palate on the L has a 1x1.5 cms abrasion with redness around it and is very tender Eyes:     General: No scleral icterus.    Conjunctiva/sclera: Conjunctivae normal.  Pulmonary:     Effort: Pulmonary effort is normal.  Musculoskeletal:        General: Normal range of motion.     Cervical back: Neck supple.  Lymphadenopathy:     Cervical: No cervical adenopathy.  Skin:    General: Skin is warm and dry.     Findings: No rash.  Neurological:     Mental Status: She is alert and oriented to person, place, and time.     Gait: Gait normal.  Psychiatric:        Mood and Affect: Mood normal.        Thought Content: Thought content normal.        Judgment: Judgment normal.    UC Treatments / Results  Labs (all labs ordered are listed, but only abnormal results are displayed) Labs Reviewed - No data to display  EKG   Radiology No results found.  Procedures Procedures (including critical care time)  Medications Ordered in UC Medications - No data to display  Initial Impression / Assessment and Plan / UC Course  I have reviewed the triage vital signs and the nursing notes. Has hard palate abrasion which is getting infected. I placed her on penicillin as noted and viscus xylocaine jelly as noted.   Final Clinical Impressions(s) / UC Diagnoses   Final diagnoses:  Abrasion of gum, initial encounter     Discharge Instructions     You may continue the current rinses for a couple of days     ED Prescriptions    Medication Sig Dispense Auth. Provider   penicillin v potassium (VEETID) 500 MG tablet 2 bid x 7 days 28 tablet Rodriguez-Southworth, Sylvia, PA-C   lidocaine (XYLOCAINE) 2 % jelly With a Qtip apply to oral sore before eating 30 mL  Rodriguez-Southworth, Sunday Spillers, PA-C   fluconazole (DIFLUCAN) 150 MG tablet Take 1 tablet (150 mg total) by mouth daily. 1 tablet Rodriguez-Southworth, Sunday Spillers, PA-C     PDMP not reviewed this encounter.   Shelby Mattocks, [place] 06/29/20 2304

## 2020-06-29 NOTE — ED Triage Notes (Signed)
Patient states she had a colonoscopy done last Tuesday and she states they had to intubate her. She states she has a cut on the roof of her mouth that is very painful and she is having trouble eating.

## 2020-06-29 NOTE — Discharge Instructions (Addendum)
You may continue the current rinses for a couple of days

## 2020-06-30 ENCOUNTER — Ambulatory Visit: Admission: RE | Admit: 2020-06-30 | Payer: 59 | Source: Ambulatory Visit

## 2020-06-30 ENCOUNTER — Telehealth: Payer: 59 | Admitting: Family Medicine

## 2020-06-30 ENCOUNTER — Ambulatory Visit
Admission: RE | Admit: 2020-06-30 | Discharge: 2020-06-30 | Disposition: A | Discharge status: Home / Self Care | Payer: 59 | Source: Ambulatory Visit | Attending: Family Medicine | Admitting: Family Medicine

## 2020-06-30 DIAGNOSIS — N644 Mastodynia: Secondary | ICD-10-CM | POA: Diagnosis not present

## 2020-06-30 DIAGNOSIS — R922 Inconclusive mammogram: Secondary | ICD-10-CM | POA: Diagnosis not present

## 2020-06-30 DIAGNOSIS — R928 Other abnormal and inconclusive findings on diagnostic imaging of breast: Secondary | ICD-10-CM | POA: Insufficient documentation

## 2020-07-01 ENCOUNTER — Telehealth: Payer: Self-pay

## 2020-07-01 NOTE — Telephone Encounter (Signed)
Virgel Manifold, MD  You 2 hours ago (7:35 AM)     Can she come to clinic today to be seen   Message text    You routed conversation to Virgel Manifold, MD 18 hours ago (4:31 PM)   Shelby Mattocks, CMA routed conversation to You 2 days ago   Lyndee Hensen, Chriss Czar, MD 2 days ago      Good morning. I had my procedure done last Tuesday and I noticed I have a huge laceration in the left top corner of my mouth and my tongue is numb on the tip. The pain from the laceration has been awful and I am having trouble eating, with jaw pain and headaches. I thought it would get better but it has not. I'm taking Tylenol extra strength and tried Ibuprofen but it doesn't help. I think it may be infected. Can you please let me know what I need to do? Thank you     I then called patient to let her know that Dr. Bonna Gains would like to see her so she could access her but the patient stated that she actually went to an urgent care and they gave her antibiotics since her upper part of her mouth was infected. I told patient to take her antibiotics and finish them. I told her that if she was not getting any better to please give [place] a call again. Patient understood and had no further questions.

## 2020-07-10 DIAGNOSIS — D500 Iron deficiency anemia secondary to blood loss (chronic): Secondary | ICD-10-CM | POA: Diagnosis not present

## 2020-07-10 DIAGNOSIS — E11628 Type 2 diabetes mellitus with other skin complications: Secondary | ICD-10-CM | POA: Diagnosis not present

## 2020-07-10 DIAGNOSIS — E1169 Type 2 diabetes mellitus with other specified complication: Secondary | ICD-10-CM | POA: Diagnosis not present

## 2020-07-10 DIAGNOSIS — E785 Hyperlipidemia, unspecified: Secondary | ICD-10-CM | POA: Diagnosis not present

## 2020-07-10 DIAGNOSIS — E6609 Other obesity due to excess calories: Secondary | ICD-10-CM | POA: Diagnosis not present

## 2020-07-10 DIAGNOSIS — I10 Essential (primary) hypertension: Secondary | ICD-10-CM | POA: Diagnosis not present

## 2020-07-10 DIAGNOSIS — Z5181 Encounter for therapeutic drug level monitoring: Secondary | ICD-10-CM | POA: Diagnosis not present

## 2020-07-11 LAB — CBC WITH DIFFERENTIAL/PLATELET
Absolute Monocytes: 490 cells/uL (ref 200–950)
Basophils Absolute: 20 cells/uL (ref 0–200)
Basophils Relative: 0.4 %
Eosinophils Absolute: 191 cells/uL (ref 15–500)
Eosinophils Relative: 3.9 %
HCT: 44.3 % (ref 35.0–45.0)
Hemoglobin: 14.3 g/dL (ref 11.7–15.5)
Lymphs Abs: 1509 cells/uL (ref 850–3900)
MCH: 26.8 pg — ABNORMAL LOW (ref 27.0–33.0)
MCHC: 32.3 g/dL (ref 32.0–36.0)
MCV: 83.1 fL (ref 80.0–100.0)
MPV: 10.5 fL (ref 7.5–12.5)
Monocytes Relative: 10 %
Neutro Abs: 2690 cells/uL (ref 1500–7800)
Neutrophils Relative %: 54.9 %
Platelets: 376 10*3/uL (ref 140–400)
RBC: 5.33 10*6/uL — ABNORMAL HIGH (ref 3.80–5.10)
RDW: 14.1 % (ref 11.0–15.0)
Total Lymphocyte: 30.8 %
WBC: 4.9 10*3/uL (ref 3.8–10.8)

## 2020-07-11 LAB — COMPLETE METABOLIC PANEL WITH GFR
AG Ratio: 1.5 (calc) (ref 1.0–2.5)
ALT: 13 U/L (ref 6–29)
AST: 11 U/L (ref 10–35)
Albumin: 4.4 g/dL (ref 3.6–5.1)
Alkaline phosphatase (APISO): 65 U/L (ref 31–125)
BUN: 11 mg/dL (ref 7–25)
CO2: 26 mmol/L (ref 20–32)
Calcium: 9.6 mg/dL (ref 8.6–10.2)
Chloride: 104 mmol/L (ref 98–110)
Creat: 0.68 mg/dL (ref 0.50–1.10)
GFR, Est African American: 122 mL/min/{1.73_m2} (ref >=60)
GFR, Est Non African American: 105 mL/min/{1.73_m2} (ref >=60)
Globulin: 2.9 g/dL (calc) (ref 1.9–3.7)
Glucose, Bld: 100 mg/dL — ABNORMAL HIGH (ref 65–99)
Potassium: 4.1 mmol/L (ref 3.5–5.3)
Sodium: 138 mmol/L (ref 135–146)
Total Bilirubin: 0.5 mg/dL (ref 0.2–1.2)
Total Protein: 7.3 g/dL (ref 6.1–8.1)

## 2020-07-11 LAB — HEMOGLOBIN A1C
Hgb A1c MFr Bld: 6.9 % of total Hgb — ABNORMAL HIGH (ref <5.7)
Mean Plasma Glucose: 151 mg/dL
eAG (mmol/L): 8.4 mmol/L

## 2020-07-11 LAB — LIPID PANEL
Cholesterol: 202 mg/dL — ABNORMAL HIGH (ref <200)
HDL: 39 mg/dL — ABNORMAL LOW (ref >=50)
LDL Cholesterol (Calc): 142 mg/dL (calc) — ABNORMAL HIGH
Non-HDL Cholesterol (Calc): 163 mg/dL (calc) — ABNORMAL HIGH (ref <130)
Total CHOL/HDL Ratio: 5.2 (calc) — ABNORMAL HIGH (ref <5.0)
Triglycerides: 99 mg/dL (ref <150)

## 2020-07-13 ENCOUNTER — Encounter: Payer: Self-pay | Admitting: Family Medicine

## 2020-07-14 ENCOUNTER — Ambulatory Visit (INDEPENDENT_AMBULATORY_CARE_PROVIDER_SITE_OTHER): Payer: 59 | Admitting: Family Medicine

## 2020-07-14 ENCOUNTER — Encounter: Payer: Self-pay | Admitting: Family Medicine

## 2020-07-14 ENCOUNTER — Other Ambulatory Visit: Payer: Self-pay | Admitting: Family Medicine

## 2020-07-14 ENCOUNTER — Other Ambulatory Visit: Payer: Self-pay

## 2020-07-14 VITALS — BP 128/86 | HR 89 | Temp 98.2°F | Resp 16 | Ht 62.0 in | Wt 207.2 lb

## 2020-07-14 DIAGNOSIS — E1169 Type 2 diabetes mellitus with other specified complication: Secondary | ICD-10-CM

## 2020-07-14 DIAGNOSIS — J4531 Mild persistent asthma with (acute) exacerbation: Secondary | ICD-10-CM

## 2020-07-14 DIAGNOSIS — D500 Iron deficiency anemia secondary to blood loss (chronic): Secondary | ICD-10-CM | POA: Diagnosis not present

## 2020-07-14 DIAGNOSIS — E11628 Type 2 diabetes mellitus with other skin complications: Secondary | ICD-10-CM | POA: Diagnosis not present

## 2020-07-14 DIAGNOSIS — Z6837 Body mass index (BMI) 37.0-37.9, adult: Secondary | ICD-10-CM | POA: Diagnosis not present

## 2020-07-14 DIAGNOSIS — I10 Essential (primary) hypertension: Secondary | ICD-10-CM | POA: Diagnosis not present

## 2020-07-14 DIAGNOSIS — E785 Hyperlipidemia, unspecified: Secondary | ICD-10-CM

## 2020-07-14 DIAGNOSIS — J45909 Unspecified asthma, uncomplicated: Secondary | ICD-10-CM | POA: Diagnosis not present

## 2020-07-14 MED ORDER — OZEMPIC (1 MG/DOSE) 2 MG/1.5ML ~~LOC~~ SOPN: 1.0000 mg | PEN_INJECTOR | SUBCUTANEOUS | 1 refills | Status: DC

## 2020-07-14 MED ORDER — ROSUVASTATIN CALCIUM 5 MG PO TABS: 5.0000 mg | ORAL_TABLET | Freq: Every day | ORAL | 1 refills | Status: DC

## 2020-07-14 MED ORDER — AMLODIPINE BESYLATE 5 MG PO TABS: 5.0000 mg | ORAL_TABLET | Freq: Every day | ORAL | 3 refills | Status: DC

## 2020-07-14 MED ORDER — METFORMIN HCL 500 MG PO TABS: 500.0000 mg | ORAL_TABLET | Freq: Two times a day (BID) | ORAL | 3 refills | Status: DC

## 2020-07-14 MED ORDER — LOSARTAN POTASSIUM-HCTZ 100-12.5 MG PO TABS: ORAL_TABLET | ORAL | 3 refills | Status: DC

## 2020-07-14 MED ORDER — ALBUTEROL SULFATE HFA 108 (90 BASE) MCG/ACT IN AERS
2.0000 | INHALATION_SPRAY | Freq: Four times a day (QID) | RESPIRATORY_TRACT | 5 refills | Status: DC | PRN
Start: 2020-07-14 — End: 2021-05-21

## 2020-07-14 NOTE — Progress Notes (Signed)
Name: Donna Reyes   MRN: [id number]    DOB: Jul 21, 1975   Date:07/14/2020       Progress Note  Chief Complaint  Patient presents with  . Follow-up  . Hypertension     Subjective:   Donna Reyes is a 45 y.o. female, presents to clinic for routine f/up, labs recently done, printed and reviewed with the pt today during visit  Hypertension:  Currently managed on losartan-HCTZ  Pt reports good med compliance and denies any SE.   Blood pressure today is well controlled. BP Readings from Last 3 Encounters:  07/14/20 128/86  06/29/20 (!) 154/100  06/23/20 125/87   Pt denies CP, SOB, exertional sx, LE edema, palpitation, Ha's, visual disturbances, lightheadedness, hypotension, syncope. She notes BP getting high in the evening and she also feels unwell - readings about 150's   Pharmacokinetics Onset of action: Antihypertensive effect: Significant reductions in blood pressure at 24 to 48 hours after first dose; slight increase in heart rate within 10 hours of administration may reflect some vasodilating activity ([place]) Duration: Antihypertensive effect: At least 24 hours [place] 1993); has been shown to extend to at least 72 hours when discontinued after 6 to 7 weeks of therapy ([place])  HLD- on crestor 5 mg  Lab Results  Component Value Date   CHOL 202 (H) 07/10/2020   HDL 39 (L) 07/10/2020   LDLCALC 142 (H) 07/10/2020   TRIG 99 07/10/2020   CHOLHDL 5.2 (H) 07/10/2020   DM on metformin and ozempic- has been on meds for years, she is concerned with A1C increasing, though still at goal Reports good med compliance Pt has no SE from meds. Denies: Polyuria, polydipsia, vision changes, neuropathy, hypoglycemia Recent pertinent labs: Lab Results  Component Value Date   HGBA1C 6.9 (H) 07/10/2020   HGBA1C 6.7 (H) 03/10/2020   HGBA1C 6.2 11/30/2018   Standard of care and health maintenance:3 Foot exam:  done DM eye exam:  done ACEI/ARB:   losartan Statin:  crestor  Asthma - currently well controlled, not using albuterol very often, allergies typically worse in spring (currently spring), still taking singulair         Current Outpatient Medications:  .  albuterol (VENTOLIN HFA) 108 (90 Base) MCG/ACT inhaler, Inhale 2 puffs into the lungs every 6 (six) hours as needed for wheezing or shortness of breath. May also use 2 puffs prior to exercise for EIB, Disp: 8 g, Rfl: 5 .  fluconazole (DIFLUCAN) 150 MG tablet, Take 1 tablet (150 mg total) by mouth daily., Disp: 1 tablet, Rfl: 0 .  Fluticasone-Salmeterol (ADVAIR DISKUS) 100-50 MCG/DOSE AEPB, Inhale 1 puff into the lungs 2 (two) times daily., Disp: 60 each, Rfl: 3 .  losartan-hydrochlorothiazide (HYZAAR) 100-12.5 MG tablet, TAKE 1 TABLET BY MOUTH EVERY DAY (STOP LOSARTAN), Disp: 90 tablet, Rfl: 0 .  montelukast (SINGULAIR) 10 MG tablet, Take 1 tablet (10 mg total) by mouth at bedtime., Disp: 30 tablet, Rfl: 3 .  rosuvastatin (CRESTOR) 10 MG tablet, Take 1 tablet (10 mg total) by mouth daily., Disp: 90 tablet, Rfl: 1 .  Semaglutide, 1 MG/DOSE, (OZEMPIC, 1 MG/DOSE,) 2 MG/1.5ML SOPN, Inject 1 mg into the skin once a week., Disp: 6.5 mL, Rfl: 1  Patient Active Problem List   Diagnosis Date Noted  . Encounter for screening colonoscopy   . Obesity due to excess calories with serious comorbidity 03/10/2020  . Hyperlipidemia associated with type 2 diabetes mellitus ([place]) 03/10/2020  . Family history  of pancreatic cancer 10/10/2019  . Right ovarian cyst 10/10/2019  . Anxiety 03/05/2019  . Controlled type 2 diabetes mellitus with skin complication ([place]) 93/90/3009  . Palpable lymph node 08/17/2018  . Menorrhagia with regular cycle 08/14/2018  . Lymphadenopathy, axillary 10/27/2017  . Anemia 10/27/2017  . Chest pain 01/06/2016  . Essential hypertension 01/06/2016  . Mild intermittent asthma with acute exacerbation 01/06/2016  . Iron deficiency anemia 12/30/2015    Past  Surgical History:  Procedure Laterality Date  . AXILLARY LYMPH NODE BIOPSY Right 05/25/2017   LYMPHADENITIS. NEGATIVE FOR MALIGNANCY.   Marland Kitchen BREAST BIOPSY Right 05/25/2017   neg/NEGATIVE FOR CARCINOMA. INFLAMMATION AND SQUAMOUS METAPLASIA OF A FOCAL DUCT   . CESAREAN SECTION     times 3  . COLONOSCOPY WITH PROPOFOL N/A 06/23/2020   Procedure: COLONOSCOPY WITH PROPOFOL;  Surgeon: Virgel Manifold, MD;  Location: [facility];  Service: Endoscopy;  Laterality: N/A;  Diabetic  sleep apnea priority 4  . TUBAL LIGATION      Family History  Problem Relation Age of Onset  . Diabetes Mother   . Heart failure Mother   . Kidney disease Mother   . Diabetes Maternal Grandmother   . Breast cancer Maternal Grandmother 8  . Hypertension Father   . Hyperlipidemia Father   . Atrial fibrillation Sister   . ADD / ADHD Son   . Heart attack Maternal Grandfather   . Diabetes Paternal Grandmother   . Hypertension Paternal Grandmother   . Prostate cancer Paternal Grandfather   . Colon cancer Paternal Grandfather 20  . Polycystic ovary syndrome Sister   . Polycystic ovary syndrome Sister   . Pancreatic cancer Paternal Aunt 66  . Ovarian cancer Neg Hx     Social History   Tobacco Use  . Smoking status: Never Smoker  . Smokeless tobacco: Never Used  Vaping Use  . Vaping Use: Never used  Substance Use Topics  . Alcohol use: No  . Drug use: No     Allergies  Allergen Reactions  . Capsicum Shortness Of Breath    Jalapeno peppers  . Basil Oil Swelling    Tongue swelling  . Lisinopril     fatigue    Health Maintenance  Topic Date Due  . FOOT EXAM  01/29/2020  . PAP SMEAR-Modifier  09/11/2020  . COVID-19 Vaccine (4 - Booster for Pfizer series) 10/26/2020  . INFLUENZA VACCINE  11/02/2020  . OPHTHALMOLOGY EXAM  01/06/2021  . HEMOGLOBIN A1C  01/09/2021  . TETANUS/TDAP  09/14/2027  . COLONOSCOPY (Pts 45-79yrs Insurance coverage Donna need to be confirmed)  06/24/2030  .  PNEUMOCOCCAL POLYSACCHARIDE VACCINE AGE 93-64 HIGH RISK  Completed  . Hepatitis C Screening  Completed  . HIV Screening  Completed  . HPV VACCINES  Aged Out    Chart Review Today: I personally reviewed active problem list, medication list, allergies, family history, social history, health maintenance, notes from last encounter, lab results, imaging with the patient/caregiver today.   Review of Systems  Constitutional: Negative.   HENT: Negative.   Eyes: Negative.   Respiratory: Negative.   Cardiovascular: Negative.   Gastrointestinal: Negative.   Endocrine: Negative.   Genitourinary: Negative.   Musculoskeletal: Negative.   Skin: Negative.   Allergic/Immunologic: Negative.   Neurological: Negative.   Hematological: Negative.   Psychiatric/Behavioral: Negative.   All other systems reviewed and are negative.   All other systems reviewed and are negative for acute change except as noted in the HPI.  Objective:  Vitals:   07/14/20 0835  BP: 128/86  Pulse: 89  Resp: 16  Temp: 98.2 F (36.8 C)  TempSrc: Oral  SpO2: 99%  Weight: 207 lb 3.2 oz (94 kg)  Height: 5\' 2"  (1.575 m)    Body mass index is 37.9 kg/m.  Physical Exam Vitals and nursing note reviewed.  Constitutional:      General: She is not in acute distress.    Appearance: Normal appearance. She is well-developed. She is obese. She is not ill-appearing, toxic-appearing or diaphoretic.     Interventions: Face mask in place.  HENT:     Head: Normocephalic and atraumatic.     Right Ear: Tympanic membrane, ear canal and external ear normal.     Left Ear: Tympanic membrane, ear canal and external ear normal.  Eyes:     General: Lids are normal. No scleral icterus.       Right eye: No discharge.        Left eye: No discharge.     Conjunctiva/sclera: Conjunctivae normal.  Neck:     Trachea: Phonation normal. No tracheal deviation.  Cardiovascular:     Rate and Rhythm: Normal rate and regular rhythm.      Pulses: Normal pulses.          Radial pulses are 2+ on the right side and 2+ on the left side.       Posterior tibial pulses are 2+ on the right side and 2+ on the left side.     Heart sounds: Normal heart sounds. No murmur heard. No friction rub. No gallop.   Pulmonary:     Effort: Pulmonary effort is normal. No respiratory distress.     Breath sounds: Normal breath sounds. No stridor. No wheezing, rhonchi or rales.  Chest:     Chest wall: No tenderness.  Abdominal:     General: Bowel sounds are normal. There is no distension.     Palpations: Abdomen is soft.  Musculoskeletal:     Right lower leg: No edema.     Left lower leg: No edema.  Skin:    General: Skin is warm and dry.     Coloration: Skin is not jaundiced or pale.     Findings: No rash.  Neurological:     Mental Status: She is alert.     Motor: No abnormal muscle tone.     Gait: Gait normal.  Psychiatric:        Mood and Affect: Mood normal.        Speech: Speech normal.        Behavior: Behavior normal.         Assessment & Plan:     ICD-10-CM   1. Controlled type 2 diabetes mellitus with other skin complication, without long-term current use of insulin (HCC)  E11.628 metFORMIN (GLUCOPHAGE) 500 MG tablet   controlled, labs reviewed  2. Persistent asthma without complication, unspecified asthma severity  J45.909 albuterol (VENTOLIN HFA) 108 (90 Base) MCG/ACT inhaler   Uncontrolled with weather changes and multiple triggers restart maintenance inhaler, avoid triggers, manage allergies follow-up if not improving  3. Hyperlipidemia associated with type 2 diabetes mellitus (HCC)  E11.69 rosuvastatin (CRESTOR) 5 MG tablet   E78.5    poor control, discussed increasing med dose or changing meds, reviewed labs  4. Essential hypertension  I10 losartan-hydrochlorothiazide (HYZAAR) 100-12.5 MG tablet    amLODipine (NORVASC) 5 MG tablet   spiking BP in the evening - instructions given on how to take  BP, pt Donna monitor,  if continued elevation in the evenings start norvasc 5 mg q pm  5. Class 2 severe obesity with serious comorbidity and body mass index (BMI) of 37.0 to 37.9 in adult, unspecified obesity type ([place])  E66.01    Z68.37    encouraged continued efforts - possible nutritionist/DM referral? or medical weight managemet if desired  6. Iron deficiency anemia due to chronic blood loss  D50.0    last labs were improved, H/H normal    Return in about 3 months (around 10/13/2020) for 3-4 month f/up dm htn fatigue anemia labs.   Delsa Grana, PA-C 07/14/20 9:23 AM

## 2020-07-21 ENCOUNTER — Encounter: Payer: Self-pay | Admitting: Family Medicine

## 2020-07-22 ENCOUNTER — Ambulatory Visit: Payer: Self-pay

## 2020-07-22 ENCOUNTER — Ambulatory Visit (INDEPENDENT_AMBULATORY_CARE_PROVIDER_SITE_OTHER): Payer: 59 | Admitting: Family Medicine

## 2020-07-22 ENCOUNTER — Other Ambulatory Visit: Payer: Self-pay

## 2020-07-22 ENCOUNTER — Encounter: Payer: Self-pay | Admitting: Family Medicine

## 2020-07-22 VITALS — BP 118/72 | HR 73 | Temp 98.3°F | Resp 16 | Ht 62.0 in | Wt 207.8 lb

## 2020-07-22 DIAGNOSIS — J45901 Unspecified asthma with (acute) exacerbation: Secondary | ICD-10-CM

## 2020-07-22 DIAGNOSIS — E11628 Type 2 diabetes mellitus with other skin complications: Secondary | ICD-10-CM

## 2020-07-22 DIAGNOSIS — J302 Other seasonal allergic rhinitis: Secondary | ICD-10-CM | POA: Diagnosis not present

## 2020-07-22 MED ORDER — FLUTICASONE-SALMETEROL 100-50 MCG/DOSE IN AEPB
1.0000 | INHALATION_SPRAY | Freq: Two times a day (BID) | RESPIRATORY_TRACT | 11 refills | Status: DC
Start: 2020-07-22 — End: 2020-11-10

## 2020-07-22 MED ORDER — MONTELUKAST SODIUM 10 MG PO TABS: 10.0000 mg | ORAL_TABLET | Freq: Every day | ORAL | 11 refills | Status: DC

## 2020-07-22 MED ORDER — IPRATROPIUM-ALBUTEROL 0.5-2.5 (3) MG/3ML IN SOLN: 3.0000 mL | Freq: Three times a day (TID) | RESPIRATORY_TRACT | 1 refills | Status: DC | PRN

## 2020-07-22 MED ORDER — DEXAMETHASONE SODIUM PHOSPHATE 100 MG/10ML IJ SOLN: 10.0000 mg | Freq: Once | INTRAMUSCULAR | Status: DC

## 2020-07-22 MED ORDER — PREDNISONE 20 MG PO TABS: 40.0000 mg | ORAL_TABLET | Freq: Every day | ORAL | 0 refills | Status: DC

## 2020-07-22 NOTE — Patient Instructions (Signed)
Try duoneb with the steroid shot and make sure you are taking the montelukast and an allergy pill daily and see if your tightness eases up  If you still feel tight in a day or two then start the prednisone

## 2020-07-22 NOTE — Progress Notes (Signed)
Patient ID: Donna Reyes, female    DOB: [birth year], 45 y.o.   MRN: [id number]  PCP: Delsa Grana, PA-C  Chief Complaint  Patient presents with  . Asthma    Subjective:   Donna Reyes is a 45 y.o. female, presents to clinic with CC of the following:  Asthma exacerbation after going to a hotel last weekend, she was exposed to smoke and it made breathing and asthma much worse.  She couldn't even sleep there over night and had to leave.  Shes doing albuterol nebs and using her rescue inhaler but she continues to feel tight, SOB and has an audible wheeze.  She feels like she needs steroids, she has been compliant with her daily maintenance inhaler  Asthma She complains of chest tightness, cough, difficulty breathing, shortness of breath and wheezing. There is no frequent throat clearing, hoarse voice or sputum production. This is a recurrent problem. The current episode started in the past 7 days. The problem has been unchanged. The cough is non-productive. Her symptoms are aggravated by exposure to smoke and pollen. Her symptoms are not alleviated by beta-agonist. Her past medical history is significant for asthma.    DM adding metformin 500 mg once a day, sugars better 118-160  She did the freestyle meter for a few days but knocked it off after about 5 d  Patient Active Problem List   Diagnosis Date Noted  . Class 2 severe obesity with serious comorbidity and body mass index (BMI) of 37.0 to 37.9 in adult [facility]) 03/10/2020  . Hyperlipidemia associated with type 2 diabetes mellitus ([place]) 03/10/2020  . Family history of pancreatic cancer 10/10/2019  . Right ovarian cyst 10/10/2019  . Controlled type 2 diabetes mellitus with skin complication ([place]) 23/30/0762  . Menorrhagia with regular cycle 08/14/2018  . Lymphadenopathy, axillary 10/27/2017  . Essential hypertension 01/06/2016  . Mild intermittent asthma with acute exacerbation 01/06/2016  . Iron deficiency  anemia 12/30/2015      Current Outpatient Medications:  .  albuterol (VENTOLIN HFA) 108 (90 Base) MCG/ACT inhaler, Inhale 2 puffs into the lungs every 6 (six) hours as needed for wheezing or shortness of breath. May also use 2 puffs prior to exercise for EIB, Disp: 8 g, Rfl: 5 .  amLODipine (NORVASC) 5 MG tablet, Take 1 tablet (5 mg total) by mouth at bedtime., Disp: 90 tablet, Rfl: 3 .  Fluticasone-Salmeterol (ADVAIR DISKUS) 100-50 MCG/DOSE AEPB, Inhale 1 puff into the lungs 2 (two) times daily., Disp: 60 each, Rfl: 3 .  losartan-hydrochlorothiazide (HYZAAR) 100-12.5 MG tablet, TAKE 1 TABLET BY MOUTH EVERY DAY (STOP LOSARTAN), Disp: 90 tablet, Rfl: 3 .  metFORMIN (GLUCOPHAGE) 500 MG tablet, Take 1 tablet (500 mg total) by mouth 2 (two) times daily with a meal., Disp: 180 tablet, Rfl: 3 .  montelukast (SINGULAIR) 10 MG tablet, Take 1 tablet (10 mg total) by mouth at bedtime., Disp: 30 tablet, Rfl: 3 .  OZEMPIC, 1 MG/DOSE, 4 MG/3ML SOPN, INJECT 1 MG INTO THE SKIN ONCE A WEEK., Disp: 9 mL, Rfl: 1 .  rosuvastatin (CRESTOR) 5 MG tablet, Take 1 tablet (5 mg total) by mouth at bedtime., Disp: 90 tablet, Rfl: 1   Allergies  Allergen Reactions  . Capsicum Shortness Of Breath    Jalapeno peppers  . Basil Oil Swelling    Tongue swelling  . Lisinopril     fatigue     Social History   Tobacco Use  . Smoking status: Never  Smoker  . Smokeless tobacco: Never Used  Vaping Use  . Vaping Use: Never used  Substance Use Topics  . Alcohol use: No  . Drug use: No      Chart Review Today: I personally reviewed active problem list, medication list, allergies, family history, social history, health maintenance, notes from last encounter, lab results, imaging with the patient/caregiver today.   Review of Systems  Constitutional: Negative.   HENT: Negative.  Negative for hoarse voice.   Eyes: Negative.   Respiratory: Positive for cough, shortness of breath and wheezing. Negative for sputum  production.   Cardiovascular: Negative.   Gastrointestinal: Negative.   Endocrine: Negative.   Genitourinary: Negative.   Musculoskeletal: Negative.   Skin: Negative.   Allergic/Immunologic: Negative.   Neurological: Negative.   Hematological: Negative.   Psychiatric/Behavioral: Negative.   All other systems reviewed and are negative.      Objective:   Vitals:   07/22/20 0931  BP: 118/72  Pulse: 73  Resp: 16  Temp: 98.3 F (36.8 C)  SpO2: 95%  Weight: 207 lb 12.8 oz (94.3 kg)  Height: 5\' 2"  (1.575 m)    Body mass index is 38.01 kg/m.  Physical Exam Vitals and nursing note reviewed.  Constitutional:      General: She is not in acute distress.    Appearance: Normal appearance. She is obese. She is not ill-appearing, toxic-appearing or diaphoretic.  HENT:     Head: Normocephalic and atraumatic.     Right Ear: External ear normal.     Left Ear: External ear normal.  Cardiovascular:     Rate and Rhythm: Normal rate and regular rhythm.     Pulses: Normal pulses.     Heart sounds: Normal heart sounds.  Pulmonary:     Effort: No tachypnea, accessory muscle usage or respiratory distress.     Breath sounds: Decreased air movement present. No wheezing, rhonchi or rales.     Comments: Audible wheeze with conversing with pt, no wheeze with auscultation Able to speak in full sentences Skin:    General: Skin is warm and dry.     Coloration: Skin is not jaundiced or pale.  Neurological:     Mental Status: She is alert. Mental status is at baseline.  Psychiatric:        Mood and Affect: Mood normal.        Behavior: Behavior normal.      Results for orders placed or performed during the hospital encounter of 06/23/20  Glucose, capillary  Result Value Ref Range   Glucose-Capillary 104 (H) 70 - 99 mg/dL  Glucose, capillary  Result Value Ref Range   Glucose-Capillary 106 (H) 70 - 99 mg/dL  Pregnancy, urine POC  Result Value Ref Range   Preg Test, Ur NEGATIVE NEGATIVE        Assessment & Plan:     ICD-10-CM   1. Moderate asthma with exacerbation, unspecified whether persistent  J45.901 ipratropium-albuterol (DUONEB) 0.5-2.5 (3) MG/3ML SOLN    Fluticasone-Salmeterol (ADVAIR DISKUS) 100-50 MCG/DOSE AEPB    montelukast (SINGULAIR) 10 MG tablet    predniSONE (DELTASONE) 20 MG tablet    DISCONTINUED: dexamethasone (DECADRON) injection 10 mg   start steroid burst, try duonebs TID prn, attempted to do neb tx and steroid shot in clinic but meds/supplies not available  2. Seasonal allergies  J30.2 montelukast (SINGULAIR) 10 MG tablet   continue singulair, add antihistamine  3. Controlled type 2 diabetes mellitus with other skin complication, without long-term current  use of insulin ([place])  E11.628    better daily sugar readings with addition of metformin 500 mg once daily - continue same dose - f/up in 3-4 months         Delsa Grana, PA-C 07/22/20 9:55 AM

## 2020-07-26 ENCOUNTER — Encounter: Payer: Self-pay | Admitting: Family Medicine

## 2020-07-27 MED ORDER — ALBUTEROL SULFATE (2.5 MG/3ML) 0.083% IN NEBU: 2.5000 mg | INHALATION_SOLUTION | Freq: Four times a day (QID) | RESPIRATORY_TRACT | 1 refills | Status: DC | PRN

## 2020-07-27 MED ORDER — PREDNISONE 20 MG PO TABS: ORAL_TABLET | ORAL | 0 refills | Status: DC

## 2020-09-02 ENCOUNTER — Other Ambulatory Visit: Payer: 59

## 2020-09-02 ENCOUNTER — Other Ambulatory Visit: Payer: Self-pay

## 2020-09-02 DIAGNOSIS — D500 Iron deficiency anemia secondary to blood loss (chronic): Secondary | ICD-10-CM

## 2020-09-07 ENCOUNTER — Encounter: Payer: Self-pay | Admitting: Oncology

## 2020-09-08 ENCOUNTER — Other Ambulatory Visit: Payer: Self-pay | Admitting: Family Medicine

## 2020-09-08 ENCOUNTER — Ambulatory Visit
Admission: RE | Admit: 2020-09-08 | Discharge: 2020-09-08 | Disposition: A | Discharge status: Home / Self Care | Payer: No Typology Code available for payment source | Source: Ambulatory Visit | Attending: Emergency Medicine | Admitting: Emergency Medicine

## 2020-09-08 ENCOUNTER — Encounter: Payer: Self-pay | Admitting: Family Medicine

## 2020-09-08 ENCOUNTER — Other Ambulatory Visit: Payer: Self-pay

## 2020-09-08 VITALS — BP 123/87 | HR 88 | Temp 98.2°F | Resp 18

## 2020-09-08 DIAGNOSIS — J4541 Moderate persistent asthma with (acute) exacerbation: Secondary | ICD-10-CM

## 2020-09-08 DIAGNOSIS — J4540 Moderate persistent asthma, uncomplicated: Secondary | ICD-10-CM

## 2020-09-08 MED ORDER — PREDNISONE 10 MG (21) PO TBPK: ORAL_TABLET | Freq: Every day | ORAL | 0 refills | Status: DC

## 2020-09-08 NOTE — ED Provider Notes (Signed)
Donna Reyes    CSN: [id number] Arrival date & time: 09/08/20  4332      History   Chief Complaint Chief Complaint  Patient presents with  . Shortness of Breath  . Wheezing  . Chest Pain    HPI Donna Reyes is a 45 y.o. female.   Patient presents with 2-day history of wheezing, chest tightness, shortness of breath, nonproductive cough.  She states her symptoms are getting worse.  Treatment at home with albuterol nebulizer and inhaler.she also uses Advair and Singulair.  She denies fever, chills, or other symptoms.  Patient states she has had frequent asthma exacerbations recently; she and her PCP have talked about referral to pulmonology.  Her medical history includes hypertension, asthma, diabetes.  The history is provided by the patient and medical records.    Past Medical History:  Diagnosis Date  . Allergy   . Anemia   . Anxiety 03/05/2019  . Asthma   . BRCA negative 10/2019   MyRisk neg; IBIS=7.8%  . Diabetes mellitus without complication ([place])    type 2  . Family history of adverse reaction to anesthesia    Father - slow to wake  . Family history of pancreatic cancer   . Hypertension   . Sleep apnea   . Wears contact lenses     Patient Active Problem List   Diagnosis Date Noted  . Seasonal allergies 07/22/2020  . Class 2 severe obesity with serious comorbidity and body mass index (BMI) of 37.0 to 37.9 in adult [facility]) 03/10/2020  . Hyperlipidemia associated with type 2 diabetes mellitus ([place]) 03/10/2020  . Family history of pancreatic cancer 10/10/2019  . Right ovarian cyst 10/10/2019  . Controlled type 2 diabetes mellitus with skin complication ([place]) 95/18/8416  . Menorrhagia with regular cycle 08/14/2018  . Lymphadenopathy, axillary 10/27/2017  . Essential hypertension 01/06/2016  . Mild intermittent asthma with acute exacerbation 01/06/2016  . Iron deficiency anemia 12/30/2015    Past Surgical History:  Procedure Laterality Date  .  AXILLARY LYMPH NODE BIOPSY Right 05/25/2017   LYMPHADENITIS. NEGATIVE FOR MALIGNANCY.   Marland Kitchen BREAST BIOPSY Right 05/25/2017   neg/NEGATIVE FOR CARCINOMA. INFLAMMATION AND SQUAMOUS METAPLASIA OF A FOCAL DUCT   . CESAREAN SECTION     times 3  . COLONOSCOPY WITH PROPOFOL N/A 06/23/2020   Procedure: COLONOSCOPY WITH PROPOFOL;  Surgeon: Virgel Manifold, MD;  Location: [place];  Service: Endoscopy;  Laterality: N/A;  Diabetic  sleep apnea priority 4  . TUBAL LIGATION      OB History    Gravida  5   Para  5   Term  5   Preterm      AB      Living  5     SAB      IAB      Ectopic      Multiple      Live Births  5        Obstetric Comments  1st Menstrual Cycle:  13 1st Pregnancy:  14          Home Medications    Prior to Admission medications   Medication Sig Start Date End Date Taking? Authorizing Provider  albuterol (PROVENTIL) (2.5 MG/3ML) 0.083% nebulizer solution Take 3 mLs (2.5 mg total) by nebulization every 6 (six) hours as needed for wheezing or shortness of breath. 07/27/20  Yes Delsa Grana, PA-C  albuterol (VENTOLIN HFA) 108 (90 Base) MCG/ACT inhaler Inhale 2 puffs into  the lungs every 6 (six) hours as needed for wheezing or shortness of breath. May also use 2 puffs prior to exercise for EIB 07/14/20  Yes Lucio Edward, Leisa, PA-C  amLODipine (NORVASC) 5 MG tablet Take 1 tablet (5 mg total) by mouth at bedtime. 07/14/20  Yes Delsa Grana, PA-C  Fluticasone-Salmeterol (ADVAIR DISKUS) 100-50 MCG/DOSE AEPB Inhale 1 puff into the lungs 2 (two) times daily. 07/22/20  Yes Delsa Grana, PA-C  losartan-hydrochlorothiazide (HYZAAR) 100-12.5 MG tablet TAKE 1 TABLET BY MOUTH EVERY DAY (STOP LOSARTAN) 07/14/20  Yes Delsa Grana, PA-C  metFORMIN (GLUCOPHAGE) 500 MG tablet Take 1 tablet (500 mg total) by mouth 2 (two) times daily with a meal. 07/14/20  Yes Tapia, Leisa, PA-C  montelukast (SINGULAIR) 10 MG tablet Take 1 tablet (10 mg total) by mouth at bedtime. 07/22/20   Yes Tapia, Leisa, PA-C  OZEMPIC, 1 MG/DOSE, 4 MG/3ML SOPN INJECT 1 MG INTO THE SKIN ONCE A WEEK. 07/14/20  Yes Delsa Grana, PA-C  predniSONE (STERAPRED UNI-PAK 21 TAB) 10 MG (21) TBPK tablet Take by mouth daily. As directed 09/08/20  Yes Sharion Balloon, NP  rosuvastatin (CRESTOR) 5 MG tablet Take 1 tablet (5 mg total) by mouth at bedtime. 07/14/20  Yes Delsa Grana, PA-C  ipratropium-albuterol (DUONEB) 0.5-2.5 (3) MG/3ML SOLN Take 3 mLs by nebulization 3 (three) times daily as needed (asthma exacerbation). 07/22/20   Delsa Grana, PA-C    Family History Family History  Problem Relation Age of Onset  . Diabetes Mother   . Heart failure Mother   . Kidney disease Mother   . Diabetes Maternal Grandmother   . Breast cancer Maternal Grandmother 74  . Hypertension Father   . Hyperlipidemia Father   . Atrial fibrillation Sister   . ADD / ADHD Son   . Heart attack Maternal Grandfather   . Diabetes Paternal Grandmother   . Hypertension Paternal Grandmother   . Prostate cancer Paternal Grandfather   . Colon cancer Paternal Grandfather 108  . Polycystic ovary syndrome Sister   . Polycystic ovary syndrome Sister   . Pancreatic cancer Paternal Aunt 73  . Ovarian cancer Neg Hx     Social History Social History   Tobacco Use  . Smoking status: Never Smoker  . Smokeless tobacco: Never Used  Vaping Use  . Vaping Use: Never used  Substance Use Topics  . Alcohol use: No  . Drug use: No     Allergies   Capsicum, Basil oil, and Lisinopril   Review of Systems Review of Systems  Constitutional: Negative for chills and fever.  HENT: Negative for ear pain and sore throat.   Respiratory: Positive for cough, chest tightness, shortness of breath and wheezing.   Cardiovascular: Negative for chest pain and palpitations.  Gastrointestinal: Negative for abdominal pain and vomiting.  Skin: Negative for color change and rash.  All other systems reviewed and are negative.    Physical Exam Triage  Vital Signs ED Triage Vitals  Enc Vitals Group     BP      Pulse      Resp      Temp      Temp src      SpO2      Weight      Height      Head Circumference      Peak Flow      Pain Score      Pain Loc      Pain Edu?      Excl.  in [place]?    No data found.  Updated Vital Signs BP 123/87 (BP Location: Left Arm)   Pulse 88   Temp 98.2 F (36.8 C) (Oral)   Resp 18   LMP  (LMP Unknown)   SpO2 95%   Visual Acuity Right Eye Distance:   Left Eye Distance:   Bilateral Distance:    Right Eye Near:   Left Eye Near:    Bilateral Near:     Physical Exam Vitals and nursing note reviewed.  Constitutional:      General: She is not in acute distress.    Appearance: She is well-developed. She is not ill-appearing.  HENT:     Head: Normocephalic and atraumatic.     Right Ear: Tympanic membrane normal.     Left Ear: Tympanic membrane normal.     Nose: Nose normal.     Mouth/Throat:     Mouth: Mucous membranes are moist.     Pharynx: Oropharynx is clear.  Eyes:     Conjunctiva/sclera: Conjunctivae normal.  Cardiovascular:     Rate and Rhythm: Normal rate and regular rhythm.     Heart sounds: Normal heart sounds.  Pulmonary:     Effort: Pulmonary effort is normal. No respiratory distress.     Breath sounds: No wheezing, rhonchi or rales.     Comments: Breath sounds diminished throughout. Abdominal:     Palpations: Abdomen is soft.     Tenderness: There is no abdominal tenderness.  Musculoskeletal:     Cervical back: Neck supple.  Skin:    General: Skin is warm and dry.  Neurological:     General: No focal deficit present.     Mental Status: She is alert and oriented to person, place, and time.     Gait: Gait normal.  Psychiatric:        Mood and Affect: Mood normal.        Behavior: Behavior normal.      UC Treatments / Results  Labs (all labs ordered are listed, but only abnormal results are displayed) Labs Reviewed - No data to  display  EKG   Radiology No results found.  Procedures Procedures (including critical care time)  Medications Ordered in UC Medications - No data to display  Initial Impression / Assessment and Plan / UC Course  I have reviewed the triage vital signs and the nursing notes.  Pertinent labs & imaging results that were available during my care of the patient were reviewed by me and considered in my medical decision making (see chart for details).   Moderate persistent asthma with acute exacerbation.  Treating with prednisone taper.  Instructed patient to continue using her albuterol and other asthma treatments at home.  Instructed her to follow-up with her PCP or a pulmonologist soon as possible.  ED precautions discussed.  She agrees to plan of care.   Final Clinical Impressions(s) / UC Diagnoses   Final diagnoses:  Moderate persistent asthma with acute exacerbation     Discharge Instructions     Take the prednisone taper as directed.  Continue to use the albuterol inhaler and nebulizer treatments at home.  Schedule an appointment with your primary care provider or a pulmonologist as soon as possible.        ED Prescriptions    Medication Sig Dispense Auth. Provider   predniSONE (STERAPRED UNI-PAK 21 TAB) 10 MG (21) TBPK tablet Take by mouth daily. As directed 21 tablet Sharion Balloon, NP  PDMP not reviewed this encounter.   Sharion Balloon, NP 09/08/20 1022

## 2020-09-08 NOTE — Discharge Instructions (Signed)
Take the prednisone taper as directed.  Continue to use the albuterol inhaler and nebulizer treatments at home.  Schedule an appointment with your primary care provider or a pulmonologist as soon as possible.

## 2020-09-08 NOTE — ED Triage Notes (Signed)
Patient c/o SOB , wheezing, chest tightness x 2 day.   Patient endorses symptoms have progressively become worst.   Patient denies fever at home.   Patient endorses dust, fresh grass, exercise and cigarette smoking make symptoms worst.   Patient is taking Singular, Advair, Albuterol, and nebulizer (last time this morning) with no relief of symptoms.

## 2020-09-12 ENCOUNTER — Other Ambulatory Visit: Payer: Self-pay | Admitting: Family Medicine

## 2020-09-12 DIAGNOSIS — E1169 Type 2 diabetes mellitus with other specified complication: Secondary | ICD-10-CM

## 2020-10-06 ENCOUNTER — Encounter: Payer: Self-pay | Admitting: Oncology

## 2020-10-13 ENCOUNTER — Ambulatory Visit: Payer: 59 | Admitting: Family Medicine

## 2020-10-27 ENCOUNTER — Other Ambulatory Visit: Payer: Self-pay

## 2020-10-27 ENCOUNTER — Encounter: Payer: Self-pay | Admitting: Obstetrics and Gynecology

## 2020-10-27 ENCOUNTER — Ambulatory Visit (INDEPENDENT_AMBULATORY_CARE_PROVIDER_SITE_OTHER): Payer: No Typology Code available for payment source | Admitting: Obstetrics and Gynecology

## 2020-10-27 ENCOUNTER — Other Ambulatory Visit (HOSPITAL_COMMUNITY)
Admission: RE | Admit: 2020-10-27 | Discharge: 2020-10-27 | Disposition: A | Discharge status: Home / Self Care | Payer: No Typology Code available for payment source | Source: Ambulatory Visit | Attending: Obstetrics and Gynecology | Admitting: Obstetrics and Gynecology

## 2020-10-27 VITALS — BP 120/70 | Ht 62.0 in | Wt 207.0 lb

## 2020-10-27 DIAGNOSIS — Z1231 Encounter for screening mammogram for malignant neoplasm of breast: Secondary | ICD-10-CM

## 2020-10-27 DIAGNOSIS — Z124 Encounter for screening for malignant neoplasm of cervix: Secondary | ICD-10-CM

## 2020-10-27 DIAGNOSIS — Z01419 Encounter for gynecological examination (general) (routine) without abnormal findings: Secondary | ICD-10-CM | POA: Diagnosis not present

## 2020-10-27 DIAGNOSIS — N939 Abnormal uterine and vaginal bleeding, unspecified: Secondary | ICD-10-CM | POA: Diagnosis not present

## 2020-10-27 DIAGNOSIS — Z1151 Encounter for screening for human papillomavirus (HPV): Secondary | ICD-10-CM | POA: Insufficient documentation

## 2020-10-27 DIAGNOSIS — A599 Trichomoniasis, unspecified: Secondary | ICD-10-CM

## 2020-10-27 NOTE — Progress Notes (Signed)
PCP:  Delsa Grana, PA-C   Chief Complaint  Patient presents with   Gynecologic Exam    Irregular bleeding, did not bleed for 4 months and just started spotting 4 weeks ago     HPI:      Donna Reyes is a 45 y.o. [id number] whose LMP was No LMP recorded. (Menstrual status: Irregular Periods)., presents today for her annual examination.  Her menses are irregular since starting depo for menorrhagia last yr. Did 2 shots and had constant, daily bleeding, so stopped it after 11/21 injection. Next depo due 2/22 and pt then stopped bleeding for 4 months. Started with light spotting 3 wks ago. Pt with hx of monthly menses, lasting 7 days, heavy flow, needing iron transfusions due to anemia last yr. Has 3 cm leio per 2021 GYN u/s; EM=19 mm but pt wasn't having AUB or BTB at the time. Had discussed IUD and endometrial ablation in past at [facility]. Hx of HTN.   Sex activity: single partner, contraception - tubal ligation. No pain/bleeding. Last Pap: 09/11/17  Results were: no abnormalities /neg HPV DNA  Hx of STDs: HPV on pap in past; s/p colpo with neg bx, no tx  Last mammogram: 06/30/20 with PCP, Results were: Cat 2; repeat in 12 months; hx of cat 4 due 2021 to RT axillary pain and adenopathy, ordered by Dr. Nolon Stalls. Pt saw Dr. Bary Castilla and mammo/LAN stable, no bx needed.  There is a FH of breast cancer in her MGM, genetic testing not indicated. There is no FH of ovarian cancer. There is FH pancreatic cancer in her pat aunt and colon cancer and prostate cancer in her PGF, Pt is MyRisk neg; IBIS=7.8%. The patient does self-breast exams.  Tobacco use: The patient denies current or previous tobacco use. Alcohol use: none No drug use.  Exercise: moderately active  Colonoscopy: 2022 at [facility]; repeat due after 10 yrs  She does get adequate calcium but not Vitamin D in her diet. Labs with PCP.  Past Medical History:  Diagnosis Date   Allergy    Anemia    Anxiety  03/05/2019   Asthma    BRCA negative 10/2019   MyRisk neg; IBIS=7.8%   Diabetes mellitus without complication ([place])    type 2   Family history of adverse reaction to anesthesia    Father - slow to wake   Family history of pancreatic cancer    Hypertension    Sleep apnea    Wears contact lenses     Past Surgical History:  Procedure Laterality Date   AXILLARY LYMPH NODE BIOPSY Right 05/25/2017   LYMPHADENITIS. NEGATIVE FOR MALIGNANCY.    BREAST BIOPSY Right 05/25/2017   neg/NEGATIVE FOR CARCINOMA. INFLAMMATION AND SQUAMOUS METAPLASIA OF A FOCAL DUCT    CESAREAN SECTION     times 3   COLONOSCOPY WITH PROPOFOL N/A 06/23/2020   Procedure: COLONOSCOPY WITH PROPOFOL;  Surgeon: Virgel Manifold, MD;  Location: [place];  Service: Endoscopy;  Laterality: N/A;  Diabetic  sleep apnea priority 4   TUBAL LIGATION      Family History  Problem Relation Age of Onset   Diabetes Mother    Heart failure Mother    Kidney disease Mother    Diabetes Maternal Grandmother    Breast cancer Maternal Grandmother 76   Hypertension Father    Hyperlipidemia Father    Atrial fibrillation Sister    ADD / ADHD Son    Heart attack Maternal  Grandfather    Diabetes Paternal Grandmother    Hypertension Paternal Grandmother    Prostate cancer Paternal Grandfather    Colon cancer Paternal Grandfather 45   Polycystic ovary syndrome Sister    Polycystic ovary syndrome Sister    Pancreatic cancer Paternal Aunt 40   Ovarian cancer Neg Hx     Social History   Socioeconomic History   Marital status: Married    Spouse name: Not on file   Number of children: 5   Years of education: 12   Highest education level: Associate degree: academic program  Occupational History   Not on file  Tobacco Use   Smoking status: Never   Smokeless tobacco: Never  Vaping Use   Vaping Use: Never used  Substance and Sexual Activity   Alcohol use: No   Drug use: No   Sexual activity: Yes    Partners:  Male    Birth control/protection: Surgical    Comment: tubal ligation   Other Topics Concern   Not on file  Social History Narrative   Not on file   Social Determinants of Health   Financial Resource Strain: Not on file  Food Insecurity: Not on file  Transportation Needs: Not on file  Physical Activity: Not on file  Stress: Not on file  Social Connections: Not on file  Intimate Partner Violence: Not on file     Current Outpatient Medications:    albuterol (PROVENTIL) (2.5 MG/3ML) 0.083% nebulizer solution, Take 3 mLs (2.5 mg total) by nebulization every 6 (six) hours as needed for wheezing or shortness of breath., Disp: 150 mL, Rfl: 1   albuterol (VENTOLIN HFA) 108 (90 Base) MCG/ACT inhaler, Inhale 2 puffs into the lungs every 6 (six) hours as needed for wheezing or shortness of breath. May also use 2 puffs prior to exercise for EIB, Disp: 8 g, Rfl: 5   amLODipine (NORVASC) 5 MG tablet, Take 1 tablet (5 mg total) by mouth at bedtime., Disp: 90 tablet, Rfl: 3   Fluticasone-Salmeterol (ADVAIR DISKUS) 100-50 MCG/DOSE AEPB, Inhale 1 puff into the lungs 2 (two) times daily., Disp: 60 each, Rfl: 11   losartan-hydrochlorothiazide (HYZAAR) 100-12.5 MG tablet, TAKE 1 TABLET BY MOUTH EVERY DAY (STOP LOSARTAN), Disp: 90 tablet, Rfl: 3   metFORMIN (GLUCOPHAGE) 500 MG tablet, Take 1 tablet (500 mg total) by mouth 2 (two) times daily with a meal., Disp: 180 tablet, Rfl: 3   montelukast (SINGULAIR) 10 MG tablet, Take 1 tablet (10 mg total) by mouth at bedtime., Disp: 30 tablet, Rfl: 11   OZEMPIC, 1 MG/DOSE, 4 MG/3ML SOPN, INJECT 1 MG INTO THE SKIN ONCE A WEEK., Disp: 9 mL, Rfl: 1   rosuvastatin (CRESTOR) 5 MG tablet, Take 1 tablet (5 mg total) by mouth at bedtime., Disp: 90 tablet, Rfl: 1     ROS:  Review of Systems  Constitutional:  Negative for fatigue, fever and unexpected weight change.  Respiratory:  Negative for cough, shortness of breath and wheezing.   Cardiovascular:  Negative for  chest pain, palpitations and leg swelling.  Gastrointestinal:  Negative for blood in stool, constipation, diarrhea, nausea and vomiting.  Endocrine: Negative for cold intolerance, heat intolerance and polyuria.  Genitourinary:  Positive for menstrual problem. Negative for dyspareunia, dysuria, flank pain, frequency, genital sores, hematuria, pelvic pain, urgency, vaginal bleeding, vaginal discharge and vaginal pain.  Musculoskeletal:  Negative for back pain, joint swelling and myalgias.  Skin:  Negative for rash.  Neurological:  Negative for dizziness, syncope, light-headedness, numbness  and headaches.  Hematological:  Negative for adenopathy.  Psychiatric/Behavioral:  Negative for agitation, confusion, sleep disturbance and suicidal ideas. The patient is not nervous/anxious.  BREAST: No symptoms   Objective: BP 120/70   Ht '5\' 2"'  (1.575 m)   Wt 207 lb (93.9 kg)   BMI 37.86 kg/m    Physical Exam Constitutional:      Appearance: She is well-developed.  Genitourinary:     Vulva normal.     Right Labia: No rash, tenderness or lesions.    Left Labia: No tenderness, lesions or rash.    Vaginal bleeding present.     No vaginal discharge, erythema or tenderness.     Vaginal exam comments: LIGHT BROWN D/C ON EXAM.      Right Adnexa: not tender and no mass present.    Left Adnexa: not tender and no mass present.    No cervical friability or polyp.     Uterus is enlarged and irregular.     Uterus is not tender.  Breasts:    Right: No mass, nipple discharge, skin change or tenderness.     Left: No mass, nipple discharge, skin change or tenderness.  Neck:     Thyroid: No thyromegaly.  Cardiovascular:     Rate and Rhythm: Normal rate and regular rhythm.     Heart sounds: Normal heart sounds. No murmur heard. Pulmonary:     Effort: Pulmonary effort is normal.     Breath sounds: Normal breath sounds.  Abdominal:     Palpations: Abdomen is soft.     Tenderness: There is no abdominal  tenderness. There is no guarding or rebound.  Musculoskeletal:        General: Normal range of motion.     Cervical back: Normal range of motion.  Lymphadenopathy:     Cervical: No cervical adenopathy.  Neurological:     General: No focal deficit present.     Mental Status: She is alert and oriented to person, place, and time.     Cranial Nerves: No cranial nerve deficit.  Skin:    General: Skin is warm and dry.  Psychiatric:        Mood and Affect: Mood normal.        Behavior: Behavior normal.        Thought Content: Thought content normal.        Judgment: Judgment normal.  Vitals reviewed.    Assessment/Plan: Encounter for annual routine gynecological examination  Cervical cancer screening - Plan: Cytology - PAP  Screening for HPV (human papillomavirus) - Plan: Cytology - PAP  Encounter for screening mammogram for malignant neoplasm of breast; pt current on mammo with PCP  Abnormal uterine bleeding (AUB)--since stopping depo for past 3 wks. Normal to have irregular menses initially, but menorrhagia will most likely resume. Discussed prog only options, IUD, ablation, hyst. Pt to see what cycles do and f/u prn. If menorrhagia/AUB persist, will recheck leio with GYN u/s and do EMB.             GYN counsel breast self exam, mammography screening, adequate intake of calcium and vitamin D, diet and exercise     F/U  Return in about 1 year (around 10/27/2021).  Shell [place]anc[place]te B. Sharan Mcenaney, PA-C 10/27/2020 3:34 PM

## 2020-10-27 NOTE — Patient Instructions (Signed)
I value your feedback and you entrusting us with your care. If you get a [facility] patient survey, I would appreciate you taking the time to let us know about your experience today. Thank you! ? ? ?

## 2020-10-29 LAB — CYTOLOGY - PAP
Adequacy: ABSENT
Comment: NEGATIVE
Diagnosis: NEGATIVE
High risk HPV: NEGATIVE

## 2020-10-30 ENCOUNTER — Encounter: Payer: Self-pay | Admitting: Obstetrics and Gynecology

## 2020-10-30 MED ORDER — METRONIDAZOLE 500 MG PO TABS: ORAL_TABLET | ORAL | 0 refills | Status: DC

## 2020-10-30 NOTE — Addendum Note (Signed)
Addended by: Ardeth Perfect B on: [id number] 02:19 PM   Modules accepted: Orders

## 2020-11-02 ENCOUNTER — Encounter: Payer: Self-pay | Admitting: Obstetrics and Gynecology

## 2020-11-03 ENCOUNTER — Institutional Professional Consult (permissible substitution): Payer: No Typology Code available for payment source | Admitting: Pulmonary Disease

## 2020-11-10 ENCOUNTER — Ambulatory Visit (INDEPENDENT_AMBULATORY_CARE_PROVIDER_SITE_OTHER): Payer: No Typology Code available for payment source | Admitting: Family Medicine

## 2020-11-10 ENCOUNTER — Encounter: Payer: Self-pay | Admitting: Family Medicine

## 2020-11-10 ENCOUNTER — Other Ambulatory Visit: Payer: Self-pay

## 2020-11-10 ENCOUNTER — Other Ambulatory Visit
Admission: RE | Admit: 2020-11-10 | Discharge: 2020-11-10 | Disposition: A | Discharge status: Home / Self Care | Payer: No Typology Code available for payment source | Attending: Pulmonary Disease | Admitting: Pulmonary Disease

## 2020-11-10 ENCOUNTER — Ambulatory Visit (INDEPENDENT_AMBULATORY_CARE_PROVIDER_SITE_OTHER): Payer: No Typology Code available for payment source | Admitting: Pulmonary Disease

## 2020-11-10 ENCOUNTER — Encounter: Payer: Self-pay | Admitting: Oncology

## 2020-11-10 ENCOUNTER — Encounter: Payer: Self-pay | Admitting: Pulmonary Disease

## 2020-11-10 VITALS — BP 116/78 | HR 94 | Temp 98.5°F | Resp 16 | Ht 62.0 in | Wt 201.5 lb

## 2020-11-10 VITALS — BP 110/88 | HR 78 | Temp 97.5°F | Ht 62.0 in | Wt 204.0 lb

## 2020-11-10 DIAGNOSIS — H01004 Unspecified blepharitis left upper eyelid: Secondary | ICD-10-CM

## 2020-11-10 DIAGNOSIS — J45909 Unspecified asthma, uncomplicated: Secondary | ICD-10-CM

## 2020-11-10 DIAGNOSIS — Z6837 Body mass index (BMI) 37.0-37.9, adult: Secondary | ICD-10-CM

## 2020-11-10 DIAGNOSIS — E11628 Type 2 diabetes mellitus with other skin complications: Secondary | ICD-10-CM | POA: Diagnosis not present

## 2020-11-10 DIAGNOSIS — E669 Obesity, unspecified: Secondary | ICD-10-CM | POA: Diagnosis not present

## 2020-11-10 DIAGNOSIS — Z23 Encounter for immunization: Secondary | ICD-10-CM | POA: Diagnosis not present

## 2020-11-10 DIAGNOSIS — J302 Other seasonal allergic rhinitis: Secondary | ICD-10-CM

## 2020-11-10 DIAGNOSIS — J4521 Mild intermittent asthma with (acute) exacerbation: Secondary | ICD-10-CM | POA: Insufficient documentation

## 2020-11-10 DIAGNOSIS — J310 Chronic rhinitis: Secondary | ICD-10-CM

## 2020-11-10 DIAGNOSIS — E1169 Type 2 diabetes mellitus with other specified complication: Secondary | ICD-10-CM | POA: Diagnosis not present

## 2020-11-10 DIAGNOSIS — Z5181 Encounter for therapeutic drug level monitoring: Secondary | ICD-10-CM

## 2020-11-10 DIAGNOSIS — J4540 Moderate persistent asthma, uncomplicated: Secondary | ICD-10-CM | POA: Diagnosis not present

## 2020-11-10 DIAGNOSIS — I10 Essential (primary) hypertension: Secondary | ICD-10-CM | POA: Diagnosis not present

## 2020-11-10 DIAGNOSIS — E785 Hyperlipidemia, unspecified: Secondary | ICD-10-CM

## 2020-11-10 LAB — CBC WITH DIFFERENTIAL/PLATELET
Abs Immature Granulocytes: 0.01 10*3/uL (ref 0.00–0.07)
Basophils Absolute: 0 10*3/uL (ref 0.0–0.1)
Basophils Relative: 1 %
Eosinophils Absolute: 0.2 10*3/uL (ref 0.0–0.5)
Eosinophils Relative: 5 %
HCT: 41.7 % (ref 36.0–46.0)
Hemoglobin: 14.4 g/dL (ref 12.0–15.0)
Immature Granulocytes: 0 %
Lymphocytes Relative: 36 %
Lymphs Abs: 1.7 10*3/uL (ref 0.7–4.0)
MCH: 29.3 pg (ref 26.0–34.0)
MCHC: 34.5 g/dL (ref 30.0–36.0)
MCV: 84.8 fL (ref 80.0–100.0)
Monocytes Absolute: 0.5 10*3/uL (ref 0.1–1.0)
Monocytes Relative: 11 %
Neutro Abs: 2.2 10*3/uL (ref 1.7–7.7)
Neutrophils Relative %: 47 %
Platelets: 359 10*3/uL (ref 150–400)
RBC: 4.92 MIL/uL (ref 3.87–5.11)
RDW: 12.8 % (ref 11.5–15.5)
WBC: 4.6 10*3/uL (ref 4.0–10.5)
nRBC: 0 % (ref 0.0–0.2)

## 2020-11-10 MED ORDER — METFORMIN HCL ER 500 MG PO TB24: 500.0000 mg | ORAL_TABLET | Freq: Every day | ORAL | 3 refills | Status: DC

## 2020-11-10 MED ORDER — SYMBICORT 160-4.5 MCG/ACT IN AERO: 2.0000 | INHALATION_SPRAY | Freq: Two times a day (BID) | RESPIRATORY_TRACT | 11 refills | Status: AC

## 2020-11-10 NOTE — Patient Instructions (Signed)
We are going to try you on an inhaler called Symbicort 160/4.5, 2 inhalations twice a day.  Make sure you rinse your mouth well after you use it.  You can use a little baking soda in the rinse water to help you clear any residue out of your mouth.  We are going to get breathing tests.  We are getting some blood test to check on allergies.  Follow-up in 6 to 8 weeks time we will call you with the results of your tests as they become available.  Please let us know if you have any issues in the interim.

## 2020-11-10 NOTE — Progress Notes (Signed)
Name: Donna Reyes   MRN: [id number]    DOB: [birth year]   Date:11/10/2020       Progress Note  Chief Complaint  Patient presents with   Diabetes   Hyperlipidemia   Hypertension     Subjective:   Donna Reyes is a 45 y.o. female, presents to clinic for routine f/up   DM:   Pt managing DM with metformin and ozempic Pt has SE from metformin - diarrhea  Blood sugars not checking often Denies: Polyuria, polydipsia, vision changes, neuropathy, hypoglycemia Recent pertinent labs: Lab Results  Component Value Date   HGBA1C 6.9 (H) 07/10/2020   HGBA1C 6.7 (H) 03/10/2020   HGBA1C 6.2 11/30/2018   Lab Results  Component Value Date   MICROALBUR 1.0 03/10/2020   LDLCALC 142 (H) 07/10/2020   CREATININE 0.68 07/10/2020   Standard of care and health maintenance: Urine Microalbumin:  n/a Foot exam:  done DM eye exam:  due nov ACEI/ARB:  losartan Statin:  crestor  Hypertension:  Currently managed on amlodipine and losartan HCTZ Pt reports good med compliance and denies any SE.   Blood pressure today is well controlled. BP Readings from Last 3 Encounters:  11/10/20 116/78  10/27/20 120/70  09/08/20 123/87   Pt denies CP, SOB, exertional sx, LE edema, palpitation, Ha's, visual disturbances, lightheadedness, hypotension, syncope. Dietary efforts for BP?  Working on DASH   Hyperlipidemia: Lipids high with last OV, started crestor 5 mg  Last Lipids: Lab Results  Component Value Date   CHOL 202 (H) 07/10/2020   HDL 39 (L) 07/10/2020   LDLCALC 142 (H) 07/10/2020   TRIG 99 07/10/2020   CHOLHDL 5.2 (H) 07/10/2020   - Denies: Chest pain, shortness of breath, myalgias, claudication  Asthma - mild persistent/intermittent?  Triggered by allergies and different seasons currently only on prn meds, not maintenance med. Worse since COVID, multiple triggers - smoke, animals, humid/hot weather - last OV she had exacerbation at that time stated she did not take  singulair regularly  Having sx nearly every day, refilled inhaler 2 weeks ago and asks for another one Still exercising and using SABA prior and this continues to help with EIB   Office Visit from 11/10/2020 in [facility]   11/10/2020   0957   Asthma History   Symptoms >2 days/week  Nighttime Awakenings >1/wk but not nightly  Asthma interference with normal activity Minor limitations  SABA use (not for EIB) > 2 days/wk--not > 1 x/day  Risk: Exacerbations requiring oral systemic steroids 2 or more / year  Asthma Severity Moderate Persistent  Pulmonology appt later today - she states no prior PFT, no recent spirometry   Obesity - still loosing some weight  Wt Readings from Last 5 Encounters:  11/10/20 201 lb 8 oz (91.4 kg)  10/27/20 207 lb (93.9 kg)  07/22/20 207 lb 12.8 oz (94.3 kg)  07/14/20 207 lb 3.2 oz (94 kg)  06/29/20 206 lb (93.4 kg)   BMI Readings from Last 5 Encounters:  11/10/20 36.85 kg/m  10/27/20 37.86 kg/m  07/22/20 38.01 kg/m  07/14/20 37.90 kg/m  06/29/20 37.68 kg/m   Still working on healthier diet and trying to exercise     Current Outpatient Medications:    albuterol (PROVENTIL) (2.5 MG/3ML) 0.083% nebulizer solution, Take 3 mLs (2.5 mg total) by nebulization every 6 (six) hours as needed for wheezing or shortness of breath., Disp: 150 mL, Rfl: 1   albuterol (VENTOLIN HFA) 108 (  90 Base) MCG/ACT inhaler, Inhale 2 puffs into the lungs every 6 (six) hours as needed for wheezing or shortness of breath. May also use 2 puffs prior to exercise for EIB, Disp: 8 g, Rfl: 5   amLODipine (NORVASC) 5 MG tablet, Take 1 tablet (5 mg total) by mouth at bedtime., Disp: 90 tablet, Rfl: 3   losartan-hydrochlorothiazide (HYZAAR) 100-12.5 MG tablet, TAKE 1 TABLET BY MOUTH EVERY DAY (STOP LOSARTAN), Disp: 90 tablet, Rfl: 3   metFORMIN (GLUCOPHAGE) 500 MG tablet, Take 1 tablet (500 mg total) by mouth 2 (two) times daily with a meal., Disp: 180 tablet, Rfl:  3   OZEMPIC, 1 MG/DOSE, 4 MG/3ML SOPN, INJECT 1 MG INTO THE SKIN ONCE A WEEK., Disp: 9 mL, Rfl: 1   rosuvastatin (CRESTOR) 5 MG tablet, Take 1 tablet (5 mg total) by mouth at bedtime., Disp: 90 tablet, Rfl: 1  Patient Active Problem List   Diagnosis Date Noted   Seasonal allergies 07/22/2020   Class 2 severe obesity with serious comorbidity and body mass index (BMI) of 37.0 to 37.9 in adult ([place]) 03/10/2020   Hyperlipidemia associated with type 2 diabetes mellitus ([place]) 03/10/2020   Family history of pancreatic cancer 10/10/2019   Right ovarian cyst 10/10/2019   Controlled type 2 diabetes mellitus with skin complication ([place]) [id number]   Menorrhagia with regular cycle 08/14/2018   Lymphadenopathy, axillary 10/27/2017   Essential hypertension 01/06/2016   Mild intermittent asthma with acute exacerbation 01/06/2016   Iron deficiency anemia 12/30/2015    Past Surgical History:  Procedure Laterality Date   AXILLARY LYMPH NODE BIOPSY Right 05/25/2017   LYMPHADENITIS. NEGATIVE FOR MALIGNANCY.    BREAST BIOPSY Right 05/25/2017   neg/NEGATIVE FOR CARCINOMA. INFLAMMATION AND SQUAMOUS METAPLASIA OF A FOCAL DUCT    CESAREAN SECTION     times 3   COLONOSCOPY WITH PROPOFOL N/A 06/23/2020   Procedure: COLONOSCOPY WITH PROPOFOL;  Surgeon: Virgel Manifold, MD;  Location: [facility];  Service: Endoscopy;  Laterality: N/A;  Diabetic  sleep apnea priority 4   TUBAL LIGATION      Family History  Problem Relation Age of Onset   Diabetes Mother    Heart failure Mother    Kidney disease Mother    Diabetes Maternal Grandmother    Breast cancer Maternal Grandmother 75   Hypertension Father    Hyperlipidemia Father    Atrial fibrillation Sister    ADD / ADHD Son    Heart attack Maternal Grandfather    Diabetes Paternal Grandmother    Hypertension Paternal Grandmother    Prostate cancer Paternal Grandfather    Colon cancer Paternal Grandfather 16   Polycystic ovary syndrome  Sister    Polycystic ovary syndrome Sister    Pancreatic cancer Paternal Aunt 69   Ovarian cancer Neg Hx     Social History   Tobacco Use   Smoking status: Never   Smokeless tobacco: Never  Vaping Use   Vaping Use: Never used  Substance Use Topics   Alcohol use: No   Drug use: No     Allergies  Allergen Reactions   Capsicum Shortness Of Breath    Jalapeno peppers   Basil Oil Swelling    Tongue swelling   Lisinopril     fatigue    Health Maintenance  Topic Date Due   Pneumococcal Vaccine 77-4 Years old (2 - PCV) 09/10/2020   COVID-19 Vaccine (4 - Booster for Pfizer series) 11/26/2020 (Originally 07/27/2020)   INFLUENZA VACCINE  12/27/2020 (  Originally 11/02/2020)   OPHTHALMOLOGY EXAM  01/06/2021   HEMOGLOBIN A1C  01/09/2021   FOOT EXAM  07/14/2021   PAP SMEAR-Modifier  10/28/2023   TETANUS/TDAP  09/14/2027   COLONOSCOPY (Pts 45-100yr Insurance coverage will need to be confirmed)  06/24/2030   PNEUMOCOCCAL POLYSACCHARIDE VACCINE AGE 74-64 HIGH RISK  Completed   Hepatitis C Screening  Completed   HIV Screening  Completed   HPV VACCINES  Aged Out    Chart Review Today: I personally reviewed active problem list, medication list, allergies, family history, social history, health maintenance, notes from last encounter, lab results, imaging with the patient/caregiver today.   Review of Systems  Constitutional: Negative.   HENT: Negative.    Eyes: Negative.   Respiratory: Negative.    Cardiovascular: Negative.   Gastrointestinal: Negative.   Endocrine: Negative.   Genitourinary: Negative.   Musculoskeletal: Negative.   Skin: Negative.   Allergic/Immunologic: Negative.   Neurological: Negative.   Hematological: Negative.   Psychiatric/Behavioral: Negative.    All other systems reviewed and are negative.   Objective:   Vitals:   11/10/20 0911  BP: 116/78  Pulse: 94  Resp: 16  Temp: 98.5 F (36.9 C)  SpO2: 98%  Weight: 201 lb 8 oz (91.4 kg)  Height: '5\' 2"'$   (1.575 m)    Body mass index is 36.85 kg/m.  Physical Exam Vitals and nursing note reviewed.  Constitutional:      General: She is not in acute distress.    Appearance: Normal appearance. She is well-developed. She is obese. She is not ill-appearing, toxic-appearing or diaphoretic.     Interventions: Face mask in place.  HENT:     Head: Normocephalic and atraumatic.     Right Ear: External ear normal.     Left Ear: External ear normal.  Eyes:     General: Lids are normal. No scleral icterus.       Right eye: No discharge.        Left eye: No discharge.     Conjunctiva/sclera: Conjunctivae normal.  Neck:     Trachea: Phonation normal. No tracheal deviation.  Cardiovascular:     Rate and Rhythm: Normal rate and regular rhythm.     Pulses: Normal pulses.          Radial pulses are 2+ on the right side and 2+ on the left side.       Posterior tibial pulses are 2+ on the right side and 2+ on the left side.     Heart sounds: Normal heart sounds. No murmur heard.   No friction rub. No gallop.  Pulmonary:     Effort: Pulmonary effort is normal. No tachypnea, accessory muscle usage or respiratory distress.     Breath sounds: Normal breath sounds. Decreased air movement present. No stridor. No wheezing, rhonchi or rales.  Chest:     Chest wall: No tenderness.  Abdominal:     General: Bowel sounds are normal. There is no distension.     Palpations: Abdomen is soft.  Musculoskeletal:     Right lower leg: No edema.     Left lower leg: No edema.  Skin:    General: Skin is warm and dry.     Coloration: Skin is not jaundiced or pale.     Findings: No rash.  Neurological:     Mental Status: She is alert. Mental status is at baseline.     Motor: No abnormal muscle tone.     Gait: Gait normal.  Psychiatric:        Mood and Affect: Mood normal.        Speech: Speech normal.        Behavior: Behavior normal.        Assessment & Plan:   1. Controlled type 2 diabetes mellitus  with other skin complication, without long-term current use of insulin (HCC) Lab Results  Component Value Date   HGBA1C 6.9 (H) 07/10/2020  On ozempic and metformin, well controlled On statin and ARB - COMPLETE METABOLIC PANEL WITH GFR - Hemoglobin A1C - Lipid panel  2. Asthma, moderate persistent, poorly-controlled Variable throughout the year - EIB as well Add singulair again and other allergy meds to help control and minimize triggers Likely need maintenance inhaler - seeing pulm today - defer to their tx recommendations  3. Hyperlipidemia associated with type 2 diabetes mellitus ([place]) Started statin - crestor 5 recheck lipid panel and lfts Lab Results  Component Value Date   CHOL 202 (H) 07/10/2020   HDL 39 (L) 07/10/2020   LDLCALC 142 (H) 07/10/2020   TRIG 99 07/10/2020   CHOLHDL 5.2 (H) 07/10/2020   Previously very uncontrolled The 10-year ASCVD risk score Mikey Bussing DC Jr., et al., 2013) is: 6.9%   Values used to calculate the score:     Age: 15 years     Sex: Female     Is Non-Hispanic African American: Yes     Diabetic: Yes     Tobacco smoker: No     Systolic Blood Pressure: 99991111 mmHg     Is BP treated: Yes     HDL Cholesterol: 39 mg/dL     Total Cholesterol: 202 mg/dL  - COMPLETE METABOLIC PANEL WITH GFR - Lipid panel  4. Essential hypertension BP Readings from Last 3 Encounters:  11/10/20 116/78  10/27/20 120/70  09/08/20 123/87     Chemistry      Component Value Date/Time   NA 138 07/10/2020 0829   NA 140 05/06/2014 0404   K 4.1 07/10/2020 0829   K 4.0 05/06/2014 0404   CL 104 07/10/2020 0829   CL 106 05/06/2014 0404   CO2 26 07/10/2020 0829   CO2 25 05/06/2014 0404   BUN 11 07/10/2020 0829   BUN 13 05/06/2014 0404   CREATININE 0.68 07/10/2020 0829      Component Value Date/Time   CALCIUM 9.6 07/10/2020 0829   CALCIUM 8.8 05/06/2014 0404   ALKPHOS 53 05/22/2019 0932   ALKPHOS 60 05/06/2014 0404   AST 11 07/10/2020 0829   AST 13 (L) 05/06/2014  0404   ALT 13 07/10/2020 0829   ALT 19 05/06/2014 0404   BILITOT 0.5 07/10/2020 0829   BILITOT 0.2 05/06/2014 0404     Stable, well controlled, BP at goal today on losartan 50 mg daily - COMPLETE METABOLIC PANEL WITH GFR  5. Class 2 severe obesity with serious comorbidity and body mass index (BMI) of 37.0 to 37.9 in adult, unspecified obesity type ([place]) Wt Readings from Last 5 Encounters:  11/10/20 201 lb 8 oz (91.4 kg)  10/27/20 207 lb (93.9 kg)  07/22/20 207 lb 12.8 oz (94.3 kg)  07/14/20 207 lb 3.2 oz (94 kg)  06/29/20 206 lb (93.4 kg)   BMI Readings from Last 5 Encounters:  11/10/20 36.85 kg/m  10/27/20 37.86 kg/m  07/22/20 38.01 kg/m  07/14/20 37.90 kg/m  06/29/20 37.68 kg/m   No weight gain, may increase ozempic, continue diet/exercise - referrals to medical weight management offered  6. Encounter for medication monitoring - COMPLETE METABOLIC PANEL WITH GFR - Hemoglobin A1C - Lipid panel  7. Seasonal allergies      resume singulair - wants to wait for pulmonology recommendations    8. Rhinitis, unspecified type      enlarged nasal turbinates, erythematous on right side, pale to left    9. Blepharitis of left upper eyelid, unspecified type      allergies vs possible stye - advised warm and cold compresses and pataday + other increased allergy control (resume singulair/antihistamines)        Return in about 6 months (around 05/13/2021) for Routine follow-up.   Delsa Grana, PA-C 11/10/20 9:38 AM

## 2020-11-10 NOTE — Progress Notes (Signed)
Subjective:    Patient ID: Donna Reyes, female    DOB: December 21, 1975, 45 y.o.   MRN: [id number] Chief Complaint  Patient presents with   Consult    Patient has HX of asthma,4-5 months has been on prednisone and having more exacerbations. Has some wheezing and chest tightness but no coughing.     HPI Patient is a 45 year old lifelong never smoker who carries a diagnosis of asthma.  She presents for evaluation and management of more frequent asthma exacerbations.  She is kindly referred by Dr. Steele Sizer.  The patient was first diagnosed with asthma at approximately 45 years of age.  This was diagnosed when she resided in [place].  She had done relatively well until 10 months ago when she noticed that she had more frequent asthma attacks and has required multiple steroid tapers.  She notes that cigar smoker, charcoal burning and pet dander do aggravate her symptoms.  There are pet rabbits in the home that actually are in her office area.  She also has 2 dogs in the home.  No exotic pets.  She has been previously on Flovent and then subsequently on Advair.  Currently she is only on as needed albuterol.  She noted relatively good control with Advair previously however, did not tolerate the powdered preparation.  She has occasional chest pain and tachypalpitations but not associated with her asthma symptoms.  She has not had any fevers, chills or sweats.  Occasional dry cough but no sputum production no hemoptysis.  No GERD symptoms.  She occasionally has nocturnal awakenings with dyspnea that are responsive to albuterol.  She has not had any lower extremity edema.  No calf tenderness  She has resided previously in [place], [place] and [place].  Review of Systems A 10 point review of systems was performed and it is as noted above otherwise negative.  Past Medical History:  Diagnosis Date   Allergy    Anemia    Anxiety 03/05/2019   Asthma    BRCA negative 10/2019   MyRisk neg;  IBIS=7.8%   Diabetes mellitus without complication ([place])    type 2   Family history of adverse reaction to anesthesia    Father - slow to wake   Family history of pancreatic cancer    Hypertension    Sleep apnea    Wears contact lenses    Past Surgical History:  Procedure Laterality Date   AXILLARY LYMPH NODE BIOPSY Right 05/25/2017   LYMPHADENITIS. NEGATIVE FOR MALIGNANCY.    BREAST BIOPSY Right 05/25/2017   neg/NEGATIVE FOR CARCINOMA. INFLAMMATION AND SQUAMOUS METAPLASIA OF A FOCAL DUCT    CESAREAN SECTION     times 3   COLONOSCOPY WITH PROPOFOL N/A 06/23/2020   Procedure: COLONOSCOPY WITH PROPOFOL;  Surgeon: Virgel Manifold, MD;  Location: [facility];  Service: Endoscopy;  Laterality: N/A;  Diabetic  sleep apnea priority 4   TUBAL LIGATION     Family History  Problem Relation Age of Onset   Diabetes Mother    Heart failure Mother    Kidney disease Mother    Diabetes Maternal Grandmother    Breast cancer Maternal Grandmother 1   Hypertension Father    Hyperlipidemia Father    Atrial fibrillation Sister    ADD / ADHD Son    Heart attack Maternal Grandfather    Diabetes Paternal Grandmother    Hypertension Paternal Grandmother    Prostate cancer Paternal Grandfather    Colon cancer Paternal Grandfather 89  Polycystic ovary syndrome Sister    Polycystic ovary syndrome Sister    Pancreatic cancer Paternal Aunt 100   Ovarian cancer Neg Hx    Social History   Tobacco Use   Smoking status: Never   Smokeless tobacco: Never  Substance Use Topics   Alcohol use: No   Allergies  Allergen Reactions   Capsicum Shortness Of Breath    Jalapeno peppers   Basil Oil Swelling    Tongue swelling   Lisinopril     fatigue   Current Meds  Medication Sig   albuterol (PROVENTIL) (2.5 MG/3ML) 0.083% nebulizer solution Take 3 mLs (2.5 mg total) by nebulization every 6 (six) hours as needed for wheezing or shortness of breath.   albuterol (VENTOLIN HFA) 108 (90  Base) MCG/ACT inhaler Inhale 2 puffs into the lungs every 6 (six) hours as needed for wheezing or shortness of breath. May also use 2 puffs prior to exercise for EIB   amLODipine (NORVASC) 5 MG tablet Take 1 tablet (5 mg total) by mouth at bedtime.   losartan-hydrochlorothiazide (HYZAAR) 100-12.5 MG tablet TAKE 1 TABLET BY MOUTH EVERY DAY (STOP LOSARTAN)   metFORMIN (GLUCOPHAGE-XR) 500 MG 24 hr tablet Take 1 tablet (500 mg total) by mouth daily with breakfast.   OZEMPIC, 1 MG/DOSE, 4 MG/3ML SOPN INJECT 1 MG INTO THE SKIN ONCE A WEEK.   rosuvastatin (CRESTOR) 5 MG tablet Take 1 tablet (5 mg total) by mouth at bedtime.   Immunization History  Administered Date(s) Administered   Influenza,inj,Quad PF,6+ Mos 11/30/2018, 02/04/2020   Influenza-Unspecified 01/19/2018   PFIZER(Purple Top)SARS-COV-2 Vaccination 10/04/2019, 10/26/2019, 04/28/2020   PNEUMOCOCCAL CONJUGATE-20 11/10/2020   Pneumococcal Polysaccharide-23 09/11/2019   Tdap 09/13/2017       Objective:   Physical Exam BP 110/88 (BP Location: Left Arm, Patient Position: Sitting, Cuff Size: Normal)   Pulse 78   Temp (!) 97.5 F (36.4 C) (Oral)   Ht '5\' 2"'  (1.575 m)   Wt 204 lb (92.5 kg)   SpO2 99%   BMI 37.31 kg/m   GENERAL: Obese woman, no acute distress, fully ambulatory.  No conversational dyspnea. HEAD: Normocephalic, atraumatic.  EYES: Pupils equal, round, reactive to light.  No scleral icterus.  MOUTH: Nose/mouth/throat not examined due to masking requirements for COVID 19. NECK: Supple. No thyromegaly. Trachea midline. No JVD.  No adenopathy. PULMONARY: Good air entry bilaterally.  Coarse, otherwise no adventitious sounds. CARDIOVASCULAR: S1 and S2. Regular rate and rhythm.  No rubs, murmurs or gallops heard. ABDOMEN: Obese otherwise benign. MUSCULOSKELETAL: No joint deformity, no clubbing, no edema.  NEUROLOGIC: No focal deficit, no gait disturbance, speech is fluent. SKIN: Intact,warm,dry. PSYCH: Mood and behavior  normal.      Assessment & Plan:     ICD-10-CM   1. Moderate asthma without complication, unspecified whether persistent  J45.909 CBC with Differential/Platelet    Pulmonary Function Test [facility] Only    Perennial allergen profile IgE    Rabbit Epithelia IgE   Will obtain PFTs, CBC with differential Allergy panel to include rabbit epithelia sensitivity Symbicort 160/4.5, 2 inhalations twice a day PRN albuterol    2. Obesity (BMI 35.0-39.9 without comorbidity)  E66.9    This issue adds complexity to her management Obese individuals have usually poor control of asthma Obesity increases inflammatory response     Orders Placed This Encounter  Procedures   CBC with Differential/Platelet    Standing Status:   Future    Number of Occurrences:   1  Standing Expiration Date:   11/10/2021   Perennial allergen profile IgE    Standing Status:   Future    Number of Occurrences:   1    Standing Expiration Date:   11/10/2021   Rabbit Epithelia IgE    Standing Status:   Future    Number of Occurrences:   1    Standing Expiration Date:   11/10/2021   Pulmonary Function Test [facility] Only    Standing Status:   Future    Standing Expiration Date:   11/10/2021    Order Specific Question:   Full PFT: includes the following: basic spirometry, spirometry pre & post bronchodilator, diffusion capacity (DLCO), lung volumes    Answer:   Full PFT   Meds ordered this encounter  Medications   SYMBICORT 160-4.5 MCG/ACT inhaler    Sig: Inhale 2 puffs into the lungs in the morning and at bedtime.    Dispense:  10.2 g    Refill:  11    Pt. Does not tolerate powdered inhalers   We will see the patient in 6 to 8 weeks time she is to contact us prior to that time should any new difficulties arise.   Renold Don, MD Advanced Bronchoscopy [facility]    *This note was dictated using voice recognition software/Dragon.  Despite best efforts to proofread, errors can occur which can  change the meaning.  Any change was purely unintentional.

## 2020-11-11 LAB — COMPLETE METABOLIC PANEL WITH GFR
AG Ratio: 1.5 (calc) (ref 1.0–2.5)
ALT: 12 U/L (ref 6–29)
AST: 11 U/L (ref 10–35)
Albumin: 4.1 g/dL (ref 3.6–5.1)
Alkaline phosphatase (APISO): 54 U/L (ref 31–125)
BUN: 13 mg/dL (ref 7–25)
CO2: 28 mmol/L (ref 20–32)
Calcium: 9.3 mg/dL (ref 8.6–10.2)
Chloride: 102 mmol/L (ref 98–110)
Creat: 0.7 mg/dL (ref 0.50–0.99)
Globulin: 2.8 g/dL (calc) (ref 1.9–3.7)
Glucose, Bld: 100 mg/dL (ref 65–139)
Potassium: 3.6 mmol/L (ref 3.5–5.3)
Sodium: 137 mmol/L (ref 135–146)
Total Bilirubin: 0.4 mg/dL (ref 0.2–1.2)
Total Protein: 6.9 g/dL (ref 6.1–8.1)
eGFR: 108 mL/min/{1.73_m2} (ref >=60)

## 2020-11-11 LAB — HEMOGLOBIN A1C
Hgb A1c MFr Bld: 6.6 % of total Hgb — ABNORMAL HIGH (ref <5.7)
Mean Plasma Glucose: 143 mg/dL
eAG (mmol/L): 7.9 mmol/L

## 2020-11-11 LAB — LIPID PANEL
Cholesterol: 212 mg/dL — ABNORMAL HIGH (ref <200)
HDL: 38 mg/dL — ABNORMAL LOW (ref >=50)
LDL Cholesterol (Calc): 144 mg/dL (calc) — ABNORMAL HIGH
Non-HDL Cholesterol (Calc): 174 mg/dL (calc) — ABNORMAL HIGH (ref <130)
Total CHOL/HDL Ratio: 5.6 (calc) — ABNORMAL HIGH (ref <5.0)
Triglycerides: 164 mg/dL — ABNORMAL HIGH (ref <150)

## 2020-11-13 LAB — MISC LABCORP TEST (SEND OUT)
Labcorp test code: 602700
Labcorp test code: 62497

## 2020-11-17 ENCOUNTER — Encounter: Payer: Self-pay | Admitting: *Deleted

## 2020-11-27 ENCOUNTER — Inpatient Hospital Stay: Payer: No Typology Code available for payment source | Attending: Internal Medicine

## 2020-11-27 ENCOUNTER — Other Ambulatory Visit: Payer: No Typology Code available for payment source

## 2020-11-27 DIAGNOSIS — D509 Iron deficiency anemia, unspecified: Secondary | ICD-10-CM | POA: Diagnosis not present

## 2020-11-27 DIAGNOSIS — D500 Iron deficiency anemia secondary to blood loss (chronic): Secondary | ICD-10-CM

## 2020-11-27 LAB — CBC WITH DIFFERENTIAL/PLATELET
Abs Immature Granulocytes: 0.02 10*3/uL (ref 0.00–0.07)
Basophils Absolute: 0 10*3/uL (ref 0.0–0.1)
Basophils Relative: 0 %
Eosinophils Absolute: 0.2 10*3/uL (ref 0.0–0.5)
Eosinophils Relative: 4 %
HCT: 43.3 % (ref 36.0–46.0)
Hemoglobin: 14.5 g/dL (ref 12.0–15.0)
Immature Granulocytes: 0 %
Lymphocytes Relative: 31 %
Lymphs Abs: 1.5 10*3/uL (ref 0.7–4.0)
MCH: 28.7 pg (ref 26.0–34.0)
MCHC: 33.5 g/dL (ref 30.0–36.0)
MCV: 85.6 fL (ref 80.0–100.0)
Monocytes Absolute: 0.6 10*3/uL (ref 0.1–1.0)
Monocytes Relative: 12 %
Neutro Abs: 2.5 10*3/uL (ref 1.7–7.7)
Neutrophils Relative %: 53 %
Platelets: 389 10*3/uL (ref 150–400)
RBC: 5.06 MIL/uL (ref 3.87–5.11)
RDW: 12.4 % (ref 11.5–15.5)
WBC: 4.7 10*3/uL (ref 4.0–10.5)
nRBC: 0 % (ref 0.0–0.2)

## 2020-11-27 LAB — FERRITIN: Ferritin: 41 ng/mL (ref 11–307)

## 2020-11-30 ENCOUNTER — Other Ambulatory Visit: Payer: 59

## 2020-12-01 ENCOUNTER — Ambulatory Visit: Payer: 59

## 2020-12-01 ENCOUNTER — Other Ambulatory Visit: Payer: Self-pay | Admitting: *Deleted

## 2020-12-01 ENCOUNTER — Ambulatory Visit: Payer: No Typology Code available for payment source | Admitting: Internal Medicine

## 2020-12-03 ENCOUNTER — Telehealth: Payer: Self-pay

## 2020-12-03 NOTE — Telephone Encounter (Signed)
Created in error

## 2020-12-03 NOTE — Telephone Encounter (Signed)
Lvm in regards to upcoming Covid test on 9/7, will try again later.

## 2020-12-08 ENCOUNTER — Other Ambulatory Visit: Payer: Self-pay

## 2020-12-08 ENCOUNTER — Encounter: Payer: Self-pay | Admitting: Internal Medicine

## 2020-12-08 ENCOUNTER — Inpatient Hospital Stay: Payer: No Typology Code available for payment source

## 2020-12-08 ENCOUNTER — Inpatient Hospital Stay: Payer: No Typology Code available for payment source | Attending: Hematology and Oncology | Admitting: Internal Medicine

## 2020-12-08 DIAGNOSIS — D500 Iron deficiency anemia secondary to blood loss (chronic): Secondary | ICD-10-CM | POA: Diagnosis not present

## 2020-12-08 DIAGNOSIS — Z793 Long term (current) use of hormonal contraceptives: Secondary | ICD-10-CM | POA: Insufficient documentation

## 2020-12-08 DIAGNOSIS — E611 Iron deficiency: Secondary | ICD-10-CM | POA: Diagnosis not present

## 2020-12-08 DIAGNOSIS — N920 Excessive and frequent menstruation with regular cycle: Secondary | ICD-10-CM | POA: Diagnosis not present

## 2020-12-08 NOTE — Progress Notes (Signed)
[place] OFFICE PROGRESS NOTE  Patient Care Team: Delsa Grana, PA-C as PCP - General (Family Medicine) Bary Castilla, Forest Gleason, MD (General Surgery) Lequita Asal, MD (Inactive) as Referring Physician (Hematology and Oncology)  Cancer Staging No matching staging information was found for the patient.   Oncology History   No history exists.      INTERVAL HISTORY:  Donna Reyes 45 y.o.  female pleasant patient above history of iron deficiency anemia likely secondary to chronic heavy menstrual losses is here for follow-up.  Energy levels are adequate.  Denies any nausea vomiting abdominal pain or blood in stools black or stools.  Patient is currently off Depo-Provera; however not had menstrual bleeding in the last few months.  Review of Systems  Constitutional:  Negative for chills, diaphoresis, fever, malaise/fatigue and weight loss.  HENT:  Negative for nosebleeds and sore throat.   Eyes:  Negative for double vision.  Respiratory:  Negative for cough, hemoptysis, sputum production, shortness of breath and wheezing.   Cardiovascular:  Negative for chest pain, palpitations, orthopnea and leg swelling.  Gastrointestinal:  Negative for abdominal pain, blood in stool, constipation, diarrhea, heartburn, melena, nausea and vomiting.  Genitourinary:  Negative for dysuria, frequency and urgency.  Musculoskeletal:  Negative for back pain and joint pain.  Skin: Negative.  Negative for itching and rash.  Neurological:  Negative for dizziness, tingling, focal weakness, weakness and headaches.  Endo/Heme/Allergies:  Does not bruise/bleed easily.  Psychiatric/Behavioral:  Negative for depression. The patient is not nervous/anxious and does not have insomnia.      PAST MEDICAL HISTORY :  Past Medical History:  Diagnosis Date   Allergy    Anemia    Anxiety 03/05/2019   Asthma    BRCA negative 10/2019   MyRisk neg; IBIS=7.8%   Diabetes mellitus without  complication ([place])    type 2   Family history of adverse reaction to anesthesia    Father - slow to wake   Family history of pancreatic cancer    Hypertension    Sleep apnea    Wears contact lenses     PAST SURGICAL HISTORY :   Past Surgical History:  Procedure Laterality Date   AXILLARY LYMPH NODE BIOPSY Right 05/25/2017   LYMPHADENITIS. NEGATIVE FOR MALIGNANCY.    BREAST BIOPSY Right 05/25/2017   neg/NEGATIVE FOR CARCINOMA. INFLAMMATION AND SQUAMOUS METAPLASIA OF A FOCAL DUCT    CESAREAN SECTION     times 3   COLONOSCOPY WITH PROPOFOL N/A 06/23/2020   Procedure: COLONOSCOPY WITH PROPOFOL;  Surgeon: Virgel Manifold, MD;  Location: [facility];  Service: Endoscopy;  Laterality: N/A;  Diabetic  sleep apnea priority 4   TUBAL LIGATION      FAMILY HISTORY :   Family History  Problem Relation Age of Onset   Diabetes Mother    Heart failure Mother    Kidney disease Mother    Diabetes Maternal Grandmother    Breast cancer Maternal Grandmother 2   Hypertension Father    Hyperlipidemia Father    Atrial fibrillation Sister    ADD / ADHD Son    Heart attack Maternal Grandfather    Diabetes Paternal Grandmother    Hypertension Paternal Grandmother    Prostate cancer Paternal Grandfather    Colon cancer Paternal Grandfather 69   Polycystic ovary syndrome Sister    Polycystic ovary syndrome Sister    Pancreatic cancer Paternal Aunt 63   Ovarian cancer Neg Hx  SOCIAL HISTORY:   Social History   Tobacco Use   Smoking status: Never   Smokeless tobacco: Never  Vaping Use   Vaping Use: Never used  Substance Use Topics   Alcohol use: No   Drug use: No    ALLERGIES:  is allergic to capsicum, basil oil, and lisinopril.  MEDICATIONS:  Current Outpatient Medications  Medication Sig Dispense Refill   albuterol (PROVENTIL) (2.5 MG/3ML) 0.083% nebulizer solution Take 3 mLs (2.5 mg total) by nebulization every 6 (six) hours as needed for wheezing or shortness  of breath. 150 mL 1   albuterol (VENTOLIN HFA) 108 (90 Base) MCG/ACT inhaler Inhale 2 puffs into the lungs every 6 (six) hours as needed for wheezing or shortness of breath. May also use 2 puffs prior to exercise for EIB 8 g 5   amLODipine (NORVASC) 5 MG tablet Take 1 tablet (5 mg total) by mouth at bedtime. 90 tablet 3   losartan-hydrochlorothiazide (HYZAAR) 100-12.5 MG tablet TAKE 1 TABLET BY MOUTH EVERY DAY (STOP LOSARTAN) 90 tablet 3   metFORMIN (GLUCOPHAGE-XR) 500 MG 24 hr tablet Take 1 tablet (500 mg total) by mouth daily with breakfast. 90 tablet 3   OZEMPIC, 1 MG/DOSE, 4 MG/3ML SOPN INJECT 1 MG INTO THE SKIN ONCE A WEEK. 9 mL 1   rosuvastatin (CRESTOR) 5 MG tablet Take 1 tablet (5 mg total) by mouth at bedtime. 90 tablet 1   SYMBICORT 160-4.5 MCG/ACT inhaler Inhale 2 puffs into the lungs in the morning and at bedtime. 10.2 g 11   No current facility-administered medications for this visit.    PHYSICAL EXAMINATION:  BP 126/89   Pulse 76   Temp (!) 96.6 F (35.9 C)   Resp 16   Wt 201 lb 15.1 oz (91.6 kg)   SpO2 99%   BMI 36.94 kg/m   Filed Weights   12/08/20 1041  Weight: 201 lb 15.1 oz (91.6 kg)    Physical Exam Vitals and nursing note reviewed.  HENT:     Head: Normocephalic and atraumatic.     Mouth/Throat:     Pharynx: Oropharynx is clear.  Eyes:     Extraocular Movements: Extraocular movements intact.     Pupils: Pupils are equal, round, and reactive to light.  Cardiovascular:     Rate and Rhythm: Normal rate and regular rhythm.  Pulmonary:     Comments: Decreased breath sounds bilaterally.  Abdominal:     Palpations: Abdomen is soft.  Musculoskeletal:        General: Normal range of motion.     Cervical back: Normal range of motion.  Skin:    General: Skin is warm.  Neurological:     General: No focal deficit present.     Mental Status: She is alert and oriented to person, place, and time.  Psychiatric:        Behavior: Behavior normal.         Judgment: Judgment normal.      LABORATORY DATA:  I have reviewed the data as listed    Component Value Date/Time   NA 137 11/10/2020 1024   NA 140 05/06/2014 0404   K 3.6 11/10/2020 1024   K 4.0 05/06/2014 0404   CL 102 11/10/2020 1024   CL 106 05/06/2014 0404   CO2 28 11/10/2020 1024   CO2 25 05/06/2014 0404   GLUCOSE 100 11/10/2020 1024   GLUCOSE 107 (H) 05/06/2014 0404   BUN 13 11/10/2020 1024   BUN 13 05/06/2014 0404  CREATININE 0.70 11/10/2020 1024   CALCIUM 9.3 11/10/2020 1024   CALCIUM 8.8 05/06/2014 0404   PROT 6.9 11/10/2020 1024   PROT 7.1 05/06/2014 0404   ALBUMIN 4.1 05/22/2019 0932   ALBUMIN 3.2 (L) 05/06/2014 0404   AST 11 11/10/2020 1024   AST 13 (L) 05/06/2014 0404   ALT 12 11/10/2020 1024   ALT 19 05/06/2014 0404   ALKPHOS 53 05/22/2019 0932   ALKPHOS 60 05/06/2014 0404   BILITOT 0.4 11/10/2020 1024   BILITOT 0.2 05/06/2014 0404   GFRNONAA 105 07/10/2020 0829   GFRAA 122 07/10/2020 0829    No results found for: SPEP, UPEP  Lab Results  Component Value Date   WBC 4.7 11/27/2020   NEUTROABS 2.5 11/27/2020   HGB 14.5 11/27/2020   HCT 43.3 11/27/2020   MCV 85.6 11/27/2020   PLT 389 11/27/2020      Chemistry      Component Value Date/Time   NA 137 11/10/2020 1024   NA 140 05/06/2014 0404   K 3.6 11/10/2020 1024   K 4.0 05/06/2014 0404   CL 102 11/10/2020 1024   CL 106 05/06/2014 0404   CO2 28 11/10/2020 1024   CO2 25 05/06/2014 0404   BUN 13 11/10/2020 1024   BUN 13 05/06/2014 0404   CREATININE 0.70 11/10/2020 1024      Component Value Date/Time   CALCIUM 9.3 11/10/2020 1024   CALCIUM 8.8 05/06/2014 0404   ALKPHOS 53 05/22/2019 0932   ALKPHOS 60 05/06/2014 0404   AST 11 11/10/2020 1024   AST 13 (L) 05/06/2014 0404   ALT 12 11/10/2020 1024   ALT 19 05/06/2014 0404   BILITOT 0.4 11/10/2020 1024   BILITOT 0.2 05/06/2014 0404       RADIOGRAPHIC STUDIES: I have personally reviewed the radiological images as listed and agreed  with the findings in the report. No results found.   ASSESSMENT & PLAN:  Iron deficiency #  Iron deficiency anemia: Hb 14; ferritin 41.  Hold Venofer. [not on PO iron sec to stomach ulcer]  #Etiology: Menorrhagia [currently off Depo-Provera- improved].   # DISPOSITION: # NO venofer # 6 months for MD assessment, labs (CBC with diff, iron studies/ferritin- day before) and +/- Venofer- Dr.B.     Orders Placed This Encounter  Procedures   CBC with Differential    Standing Status:   Future    Standing Expiration Date:   12/08/2021   Ferritin    Standing Status:   Future    Standing Expiration Date:   12/08/2021   Iron and TIBC    Standing Status:   Future    Standing Expiration Date:   12/08/2021   All questions were answered. The patient knows to call the clinic with any problems, questions or concerns.      Donna Sickle, MD 12/08/2020 12:26 PM

## 2020-12-08 NOTE — Assessment & Plan Note (Addendum)
#   Iron deficiency anemia: Hb 14; ferritin 41.  Hold Venofer. [not on PO iron sec to stomach ulcer]  #Etiology: Menorrhagia [currently off Depo-Provera- improved].   # DISPOSITION: # NO venofer # 6 months for MD assessment, labs (CBC with diff, iron studies/ferritin- day before) and +/- Venofer- Dr.B.

## 2020-12-09 ENCOUNTER — Other Ambulatory Visit
Admission: RE | Admit: 2020-12-09 | Discharge: 2020-12-09 | Disposition: A | Discharge status: Home / Self Care | Payer: No Typology Code available for payment source | Source: Ambulatory Visit | Attending: Pulmonary Disease | Admitting: Pulmonary Disease

## 2020-12-09 DIAGNOSIS — Z01812 Encounter for preprocedural laboratory examination: Secondary | ICD-10-CM | POA: Diagnosis not present

## 2020-12-09 DIAGNOSIS — Z20822 Contact with and (suspected) exposure to covid-19: Secondary | ICD-10-CM | POA: Insufficient documentation

## 2020-12-09 LAB — SARS CORONAVIRUS 2 (TAT 6-24 HRS): SARS Coronavirus 2: NEGATIVE

## 2020-12-10 ENCOUNTER — Ambulatory Visit: Payer: No Typology Code available for payment source

## 2020-12-10 ENCOUNTER — Telehealth: Payer: Self-pay | Admitting: Pulmonary Disease

## 2020-12-10 NOTE — Telephone Encounter (Signed)
I have spoke with the patient and her PFT for today has been CXL. She now has Covid Test appt on 12/31/20 @ 8:00am and PFT rescheduled for 01/01/21 @ 11:00am

## 2020-12-12 ENCOUNTER — Encounter: Payer: Self-pay | Admitting: Oncology

## 2020-12-12 ENCOUNTER — Telehealth: Payer: No Typology Code available for payment source | Admitting: Nurse Practitioner

## 2020-12-12 ENCOUNTER — Telehealth: Payer: No Typology Code available for payment source

## 2020-12-12 DIAGNOSIS — R52 Pain, unspecified: Secondary | ICD-10-CM | POA: Diagnosis not present

## 2020-12-12 DIAGNOSIS — J029 Acute pharyngitis, unspecified: Secondary | ICD-10-CM | POA: Diagnosis not present

## 2020-12-12 DIAGNOSIS — R5081 Fever presenting with conditions classified elsewhere: Secondary | ICD-10-CM

## 2020-12-12 MED ORDER — NIRMATRELVIR/RITONAVIR (PAXLOVID)TABLET: 3.0000 | ORAL_TABLET | Freq: Two times a day (BID) | ORAL | 0 refills | Status: AC

## 2020-12-12 NOTE — Patient Instructions (Signed)
You are being prescribed PAXLOVID for COVID-19 infection.    Please call the pharmacy or go through the drive through vs going inside if you are picking up the mediation yourself to prevent further spread. If prescribed to a [facility] affiliated pharmacy, a pharmacist will bring the medication out to your car.   Medications to hold while taking this treatment: crestor  *If asked to hold, you can resume them 24 hours after your last dose   ADMINISTRATION INSTRUCTIONS: Take with or without food. Swallow the tablets whole. Don't chew, crush, or break the medications because it might not work as well  For each dose of the medication, you should be taking 3 tablets together (2 pink oval and 1 white oval) TWICE a day for FIVE days   Finish your full five-day course of Paxlovid even if you feel better before you're done. Stopping this medication too early can make it less effective to prevent severe illness related to [place].    Paxlovid is prescribed for YOU ONLY. Don't share it with others, even if they have similar symptoms as you. This medication might not be right for everyone.  Make sure to take steps to protect yourself and others while you're taking this medication in order to get well soon and to prevent others from getting sick with COVID-19.  Paxlovid (nirmatrelvir / ritonavir) can cause hormonal birth control medications to not work well. If you or your partner is currently taking hormonal birth control, use condoms or other birth control methods to prevent unintended pregnancies.    COMMON SIDE EFFECTS: Altered or bad taste in your mouth  Diarrhea  High blood pressure (1% of people) Muscle aches (1% of people)     If your COVID-19 symptoms get worse, get medical help right away. Call 911 if you experience symptoms such as worsening cough, trouble breathing, chest pain that doesn't go away, confusion, a hard time staying awake, and pale or blue-colored skin. This medication  won't prevent all COVID-19 cases from getting worse.

## 2020-12-12 NOTE — Progress Notes (Signed)
Virtual Visit Consent   Donna Reyes, you are scheduled for a virtual visit with Mary-Margaret Hassell Done, [place], a [facility] provider, today.     Just as with appointments in the office, your consent must be obtained to participate.  Your consent will be active for this visit and any virtual visit you may have with one of our providers in the next 365 days.     If you have a MyChart account, a copy of this consent can be sent to you electronically.  All virtual visits are billed to your insurance company just like a traditional visit in the office.    As this is a virtual visit, video technology does not allow for your provider to perform a traditional examination.  This may limit your provider's ability to fully assess your condition.  If your provider identifies any concerns that need to be evaluated in person or the need to arrange testing (such as labs, EKG, etc.), we will make arrangements to do so.     Although advances in technology are sophisticated, we cannot ensure that it will always work on either your end or our end.  If the connection with a video visit is poor, the visit may have to be switched to a telephone visit.  With either a video or telephone visit, we are not always able to ensure that we have a secure connection.     I need to obtain your verbal consent now.   Are you willing to proceed with your visit today? YES   Donna Reyes has provided verbal consent on 12/12/2020 for a virtual visit (video or telephone).   Mary-Margaret Hassell Done, FNP   Date: 12/12/2020 8:24 AM   Virtual Visit via Video Note   I, Mary-Margaret Hassell Done, connected with Donna Reyes ([id number], 1975-07-05) on 12/12/20 at  8:45 AM EDT by a video-enabled telemedicine application and verified that I am speaking with the correct person using two identifiers.  Location: Patient: Virtual Visit Location Patient: Home Provider: Virtual Visit Location Provider: Mobile   I  discussed the limitations of evaluation and management by telemedicine and the availability of in person appointments. The patient expressed understanding and agreed to proceed.    History of Present Illness: Donna Reyes is a 45 y.o. who identifies as a female who was assigned female at birth, and is being seen today for flu.  HPI:  Patient states that on Thursday she developed body, aches, fever 100.3, sore throat and headache. Se has been taking ibuprofen and drinking liquids. Covid test on Wednesday was negative.   Review of Systems  Constitutional:  Positive for chills, fever and malaise/fatigue.  HENT:  Positive for congestion and sore throat.   Respiratory:  Positive for cough.   Musculoskeletal:  Negative for myalgias.  Neurological:  Positive for headaches.    Problems:  Patient Active Problem List   Diagnosis Date Noted   Iron deficiency 12/08/2020   Seasonal allergies 07/22/2020   Class 2 severe obesity with serious comorbidity and body mass index (BMI) of 37.0 to 37.9 in adult [facility]) 03/10/2020   Hyperlipidemia associated with type 2 diabetes mellitus ([place]) 03/10/2020   Family history of pancreatic cancer 10/10/2019   Right ovarian cyst 10/10/2019   Controlled type 2 diabetes mellitus with skin complication ([place]) [id number]   Menorrhagia with regular cycle 08/14/2018   Lymphadenopathy, axillary 10/27/2017   Essential hypertension 01/06/2016   Mild intermittent asthma with acute exacerbation 01/06/2016  Iron deficiency anemia 12/30/2015    Allergies:  Allergies  Allergen Reactions   Capsicum Shortness Of Breath    Jalapeno peppers   Basil Oil Swelling    Tongue swelling   Lisinopril     fatigue   Medications:  Current Outpatient Medications:    albuterol (PROVENTIL) (2.5 MG/3ML) 0.083% nebulizer solution, Take 3 mLs (2.5 mg total) by nebulization every 6 (six) hours as needed for wheezing or shortness of breath., Disp: 150 mL, Rfl: 1   albuterol  (VENTOLIN HFA) 108 (90 Base) MCG/ACT inhaler, Inhale 2 puffs into the lungs every 6 (six) hours as needed for wheezing or shortness of breath. May also use 2 puffs prior to exercise for EIB, Disp: 8 g, Rfl: 5   amLODipine (NORVASC) 5 MG tablet, Take 1 tablet (5 mg total) by mouth at bedtime., Disp: 90 tablet, Rfl: 3   losartan-hydrochlorothiazide (HYZAAR) 100-12.5 MG tablet, TAKE 1 TABLET BY MOUTH EVERY DAY (STOP LOSARTAN), Disp: 90 tablet, Rfl: 3   metFORMIN (GLUCOPHAGE-XR) 500 MG 24 hr tablet, Take 1 tablet (500 mg total) by mouth daily with breakfast., Disp: 90 tablet, Rfl: 3   OZEMPIC, 1 MG/DOSE, 4 MG/3ML SOPN, INJECT 1 MG INTO THE SKIN ONCE A WEEK., Disp: 9 mL, Rfl: 1   rosuvastatin (CRESTOR) 5 MG tablet, Take 1 tablet (5 mg total) by mouth at bedtime., Disp: 90 tablet, Rfl: 1   SYMBICORT 160-4.5 MCG/ACT inhaler, Inhale 2 puffs into the lungs in the morning and at bedtime., Disp: 10.2 g, Rfl: 11  Observations/Objective: Patient is well-developed, well-nourished in no acute distress.  Resting comfortably  at home.  Head is normocephalic, atraumatic.  No labored breathing.  Speech is clear and coherent with logical content.  Patient is alert and oriented at baseline.  Voice hoarse No cough during visit  Assessment and Plan:  Molly Maduro in today with chief complaint of covid positive  1. Generalized body aches in pediatric patient  2. Fever in other diseases  3. Sore throat  4. Covid positive  1. Take meds as prescribed 2. Use a cool mist humidifier especially during the winter months and when heat has been humid. 3. Use saline nose sprays frequently 4. Saline irrigations of the nose can be very helpful if done frequently.  * 4X daily for 1 week*  * Use of a nettie pot can be helpful with this. Follow directions with this* 5. Drink plenty of fluids 6. Keep thermostat turn down low 7.For any cough or congestion  Use plain Mucinex- regular strength or max strength  is fine   * Children- consult with Pharmacist for dosing 8. For fever or aces or pains- take tylenol or ibuprofen appropriate for age and weight.  * for fevers greater than 101 orally you may alternate ibuprofen and tylenol every  3 hours.   Quarantine for 5 days  Meds ordered this encounter  Medications   nirmatrelvir/ritonavir EUA (PAXLOVID) 20 x 150 MG & 10 x '100MG'$  TABS    Sig: Take 3 tablets by mouth 2 (two) times daily for 5 days. (Take nirmatrelvir 150 mg two tablets twice daily for 5 days and ritonavir 100 mg one tablet twice daily for 5 days) Patient GFR is 102    Dispense:  30 tablet    Refill:  0    Order Specific Question:   Supervising Provider    Answer:   Noemi Chapel [[id number]]    Patient had home covid test. I asked her to  do test and I would contact her back in order to complete visit.   Follow Up Instructions: I discussed the assessment and treatment plan with the patient. The patient was provided an opportunity to ask questions and all were answered. The patient agreed with the plan and demonstrated an understanding of the instructions.  A copy of instructions were sent to the patient via MyChart.  The patient was advised to call back or seek an in-person evaluation if the symptoms worsen or if the condition fails to improve as anticipated.  Time:  I spent 15 minutes with the patient via telehealth technology discussing the above problems/concerns.    Mary-Margaret Hassell Done, FNP

## 2020-12-15 NOTE — Telephone Encounter (Signed)
Ok for work note? 

## 2020-12-18 ENCOUNTER — Telehealth: Payer: Self-pay

## 2020-12-18 NOTE — Telephone Encounter (Signed)
Copied from [place] [id number]. Topic: General - Inquiry >> Dec 18, 2020  8:19 AM Lennox Solders wrote: Reason for CRM: Pt Is calling checking on FMLA paperwork she dropped off about 10 days ago

## 2020-12-18 NOTE — Telephone Encounter (Signed)
Pt notified, leisa just returned will have it done by [facility]

## 2020-12-22 ENCOUNTER — Telehealth: Payer: Self-pay | Admitting: Pulmonary Disease

## 2020-12-22 NOTE — Telephone Encounter (Signed)
Per Annia Belt, lot number and exp date is needed to be sure test is not expired. Okay to proceed with PFT on 01/21/21 as it will 20 days post covid. Patient will need proof on positive covid test.

## 2020-12-22 NOTE — Telephone Encounter (Signed)
I have spoke with the patient and she had video visit with urgent care on 12/12/20. Urgent care stated that she do a home test and it was covid positive.  She has a picture of the test but not the lot #.  Her PFT has been rescheduled for 01/21/21 @ 8:00am. Will this still work or will she still need Covid test again

## 2020-12-22 NOTE — Telephone Encounter (Signed)
Lm for Annia Belt for clarification on lot number.

## 2020-12-25 ENCOUNTER — Ambulatory Visit: Payer: No Typology Code available for payment source | Admitting: Primary Care

## 2020-12-28 ENCOUNTER — Telehealth: Payer: Self-pay | Admitting: Family Medicine

## 2020-12-28 NOTE — Telephone Encounter (Signed)
Patient called stating she needs the dates changed on her FMLA papers to 12/12/20 - 12/17/20

## 2020-12-28 NOTE — Telephone Encounter (Signed)
Paperwork has been updated with dates and faxed today.

## 2020-12-31 ENCOUNTER — Other Ambulatory Visit: Payer: No Typology Code available for payment source

## 2021-01-01 ENCOUNTER — Ambulatory Visit: Payer: No Typology Code available for payment source

## 2021-01-04 ENCOUNTER — Other Ambulatory Visit: Payer: Self-pay | Admitting: Family Medicine

## 2021-01-04 DIAGNOSIS — E785 Hyperlipidemia, unspecified: Secondary | ICD-10-CM

## 2021-01-04 DIAGNOSIS — E1169 Type 2 diabetes mellitus with other specified complication: Secondary | ICD-10-CM

## 2021-01-07 ENCOUNTER — Telehealth: Payer: Self-pay | Admitting: Obstetrics and Gynecology

## 2021-01-07 ENCOUNTER — Ambulatory Visit (INDEPENDENT_AMBULATORY_CARE_PROVIDER_SITE_OTHER): Payer: No Typology Code available for payment source | Admitting: Obstetrics and Gynecology

## 2021-01-07 ENCOUNTER — Other Ambulatory Visit (HOSPITAL_COMMUNITY)
Admission: RE | Admit: 2021-01-07 | Discharge: 2021-01-07 | Disposition: A | Discharge status: Home / Self Care | Payer: No Typology Code available for payment source | Source: Ambulatory Visit | Attending: Obstetrics and Gynecology | Admitting: Obstetrics and Gynecology

## 2021-01-07 ENCOUNTER — Ambulatory Visit
Admission: RE | Admit: 2021-01-07 | Discharge: 2021-01-07 | Disposition: A | Discharge status: Home / Self Care | Payer: No Typology Code available for payment source | Source: Ambulatory Visit | Attending: Obstetrics and Gynecology | Admitting: Obstetrics and Gynecology

## 2021-01-07 ENCOUNTER — Ambulatory Visit: Payer: No Typology Code available for payment source | Admitting: Pulmonary Disease

## 2021-01-07 ENCOUNTER — Encounter: Payer: Self-pay | Admitting: Obstetrics and Gynecology

## 2021-01-07 ENCOUNTER — Other Ambulatory Visit: Payer: Self-pay

## 2021-01-07 VITALS — BP 124/84 | Ht 62.0 in | Wt 202.0 lb

## 2021-01-07 DIAGNOSIS — N939 Abnormal uterine and vaginal bleeding, unspecified: Secondary | ICD-10-CM | POA: Insufficient documentation

## 2021-01-07 DIAGNOSIS — D219 Benign neoplasm of connective and other soft tissue, unspecified: Secondary | ICD-10-CM | POA: Insufficient documentation

## 2021-01-07 DIAGNOSIS — B9689 Other specified bacterial agents as the cause of diseases classified elsewhere: Secondary | ICD-10-CM | POA: Diagnosis not present

## 2021-01-07 DIAGNOSIS — N760 Acute vaginitis: Secondary | ICD-10-CM | POA: Diagnosis not present

## 2021-01-07 DIAGNOSIS — D259 Leiomyoma of uterus, unspecified: Secondary | ICD-10-CM

## 2021-01-07 DIAGNOSIS — Z113 Encounter for screening for infections with a predominantly sexual mode of transmission: Secondary | ICD-10-CM | POA: Diagnosis not present

## 2021-01-07 DIAGNOSIS — R102 Pelvic and perineal pain: Secondary | ICD-10-CM

## 2021-01-07 DIAGNOSIS — A599 Trichomoniasis, unspecified: Secondary | ICD-10-CM | POA: Diagnosis not present

## 2021-01-07 LAB — POCT WET PREP WITH KOH
Clue Cells Wet Prep HPF POC: POSITIVE
KOH Prep POC: POSITIVE — AB
Trichomonas, UA: POSITIVE
Yeast Wet Prep HPF POC: NEGATIVE

## 2021-01-07 MED ORDER — METRONIDAZOLE 500 MG PO TABS: 500.0000 mg | ORAL_TABLET | Freq: Two times a day (BID) | ORAL | 0 refills | Status: AC

## 2021-01-07 MED ORDER — DOXYCYCLINE HYCLATE 100 MG PO CAPS: 100.0000 mg | ORAL_CAPSULE | Freq: Two times a day (BID) | ORAL | 0 refills | Status: DC

## 2021-01-07 MED ORDER — CEFTRIAXONE SODIUM 500 MG IJ SOLR
500.0000 mg | Freq: Once | INTRAMUSCULAR | Status: AC
  Administered 2021-01-08: 500 mg via INTRAMUSCULAR

## 2021-01-07 NOTE — Progress Notes (Signed)
Donna Grana, PA-C   Chief Complaint  Patient presents with   Pelvic Pain    Entire area since Sunday, denies UTI sx   Vaginal Discharge    Spoiled odor, no itchiness or irritation since Sunday    HPI:      [name] is a 45 y.o. [id number] whose LMP was No LMP recorded. (Menstrual status: Irregular Periods)., presents today for sharp, achy, constant pelvic pain for 5 days. Taking ibup 600 mg Q6 hrs and heating pad without relief, affecting her sleep. Also with nausea, no other GI sx, no urin sx, no fevers. Hx of ovar cysts in past and pain doesn't feel similar. Per 7/21 GYN u/s, pt has Fibroid 1: 33.1 x 28.9 x 26.2 mm intramural fundal left. EM=19 mm but pt wasn't having AUB at the time. Has had neg EMB in past per pt.  Pt with hx of menorrhagia with IDA. Started depo last yr and had constant bleeding.  Last depo given 11/21. Pt stopped bleeding for 4 months 2/22, but now having frequent light bleeding/spotting last a month, stopping for a wk or so, then restarting for another month. No dysmen. Seeing hematology and had normal H/H on 8/22 labs. Pt interested in hyst for bleeding and leio tx.  Pt also with increased d/c and odor, no irritation for 5 days. Treated for trich found on pap 7/22 with flagyl, husband treated too. Hx of BV in past. She is sex active, s/p TL. No pain/bleeding with sex.   Past Medical History:  Diagnosis Date   Allergy    Anemia    Anxiety 03/05/2019   Asthma    BRCA negative 10/2019   MyRisk neg; IBIS=7.8%   Diabetes mellitus without complication ([place])    type 2   Family history of adverse reaction to anesthesia    Father - slow to wake   Family history of pancreatic cancer    Hypertension    Sleep apnea    Wears contact lenses     Past Surgical History:  Procedure Laterality Date   AXILLARY LYMPH NODE BIOPSY Right 05/25/2017   LYMPHADENITIS. NEGATIVE FOR MALIGNANCY.    BREAST BIOPSY Right 05/25/2017   neg/NEGATIVE FOR CARCINOMA.  INFLAMMATION AND SQUAMOUS METAPLASIA OF A FOCAL DUCT    CESAREAN SECTION     times 3   COLONOSCOPY WITH PROPOFOL N/A 06/23/2020   Procedure: COLONOSCOPY WITH PROPOFOL;  Surgeon: Virgel Manifold, MD;  Location: [facility];  Service: Endoscopy;  Laterality: N/A;  Diabetic  sleep apnea priority 4   TUBAL LIGATION      Family History  Problem Relation Age of Onset   Diabetes Mother    Heart failure Mother    Kidney disease Mother    Diabetes Maternal Grandmother    Breast cancer Maternal Grandmother 53   Hypertension Father    Hyperlipidemia Father    Atrial fibrillation Sister    ADD / ADHD Son    Heart attack Maternal Grandfather    Diabetes Paternal Grandmother    Hypertension Paternal Grandmother    Prostate cancer Paternal Grandfather    Colon cancer Paternal Grandfather 39   Polycystic ovary syndrome Sister    Polycystic ovary syndrome Sister    Pancreatic cancer Paternal Aunt 50   Ovarian cancer Neg Hx     Social History   Socioeconomic History   Marital status: Married    Spouse name: Not on file   Number of children: 5  Years of education: 4   Highest education level: Associate degree: academic program  Occupational History   Not on file  Tobacco Use   Smoking status: Never   Smokeless tobacco: Never  Vaping Use   Vaping Use: Never used  Substance and Sexual Activity   Alcohol use: No   Drug use: No   Sexual activity: Yes    Partners: Male    Birth control/protection: Surgical    Comment: tubal ligation   Other Topics Concern   Not on file  Social History Narrative   Not on file   Social Determinants of Health   Financial Resource Strain: Not on file  Food Insecurity: Not on file  Transportation Needs: Not on file  Physical Activity: Not on file  Stress: Not on file  Social Connections: Not on file  Intimate Partner Violence: Not on file    Outpatient Medications Prior to Visit  Medication Sig Dispense Refill   albuterol  (PROVENTIL) (2.5 MG/3ML) 0.083% nebulizer solution Take 3 mLs (2.5 mg total) by nebulization every 6 (six) hours as needed for wheezing or shortness of breath. 150 mL 1   albuterol (VENTOLIN HFA) 108 (90 Base) MCG/ACT inhaler Inhale 2 puffs into the lungs every 6 (six) hours as needed for wheezing or shortness of breath. May also use 2 puffs prior to exercise for EIB 8 g 5   amLODipine (NORVASC) 5 MG tablet Take 1 tablet (5 mg total) by mouth at bedtime. 90 tablet 3   losartan-hydrochlorothiazide (HYZAAR) 100-12.5 MG tablet TAKE 1 TABLET BY MOUTH EVERY DAY (STOP LOSARTAN) 90 tablet 3   metFORMIN (GLUCOPHAGE-XR) 500 MG 24 hr tablet Take 1 tablet (500 mg total) by mouth daily with breakfast. 90 tablet 3   OZEMPIC, 1 MG/DOSE, 4 MG/3ML SOPN INJECT 1 MG INTO THE SKIN ONCE A WEEK. 9 mL 1   rosuvastatin (CRESTOR) 5 MG tablet TAKE 1 TABLET BY MOUTH EVERYDAY AT BEDTIME 90 tablet 0   SYMBICORT 160-4.5 MCG/ACT inhaler Inhale 2 puffs into the lungs in the morning and at bedtime. 10.2 g 11   No facility-administered medications prior to visit.      ROS:  Review of Systems  Constitutional:  Negative for fever.  Gastrointestinal:  Positive for nausea. Negative for blood in stool, constipation, diarrhea and vomiting.  Genitourinary:  Positive for pelvic pain and vaginal discharge. Negative for dyspareunia, dysuria, flank pain, frequency, hematuria, urgency, vaginal bleeding and vaginal pain.  Musculoskeletal:  Negative for back pain.  Skin:  Negative for rash.  BREAST: No symptoms   OBJECTIVE:   Vitals:  BP 124/84   Ht _0  (1.575 m)   Wt 202 lb (91.6 kg)   BMI 36.95 kg/m   Physical Exam Vitals reviewed.  Constitutional:      Appearance: She is well-developed.  Pulmonary:     Effort: Pulmonary effort is normal.  Genitourinary:    General: Normal vulva.     Pubic Area: No rash.      Labia:        Right: No rash, tenderness or lesion.        Left: No rash, tenderness or lesion.       Vagina: Vaginal discharge present. No erythema or tenderness.     Cervix: Normal.     Uterus: Normal. Enlarged and tender.      Adnexa:        Right: Tenderness present. No mass.         Left: Tenderness present. No  mass.       Comments: DIFFUSE TENDERNESS IN PELVIS; MINIMAL CMT Musculoskeletal:        General: Normal range of motion.     Cervical back: Normal range of motion.  Skin:    General: Skin is warm and dry.  Neurological:     General: No focal deficit present.     Mental Status: She is alert and oriented to person, place, and time.  Psychiatric:        Mood and Affect: Mood normal.        Behavior: Behavior normal.        Thought Content: Thought content normal.        Judgment: Judgment normal.    Results: Results for orders placed or performed in visit on 01/07/21 (from the past 24 hour(s))  POCT Wet Prep with KOH     Status: Abnormal   Collection Time: 01/07/21  3:25 PM  Result Value Ref Range   Trichomonas, UA Positive    Clue Cells Wet Prep HPF POC pos    Epithelial Wet Prep HPF POC     Yeast Wet Prep HPF POC neg    Bacteria Wet Prep HPF POC     RBC Wet Prep HPF POC     WBC Wet Prep HPF POC     KOH Prep POC Positive (A) Negative     Assessment/Plan: Pelvic pain - Plan: US PELVIC COMPLETE WITH TRANSVAGINAL, Cervicovaginal ancillary only; pos sx and exam. Chck STAT GYN u/s, rule out STDs. Will f/u with results. Hx of leio, question if enlarging.   Screening for STD (sexually transmitted disease) - Plan: Cervicovaginal ancillary only  Abnormal uterine bleeding (AUB) - Plan: US PELVIC COMPLETE WITH TRANSVAGINAL; check GYN u/s. Discussed EMB today but pt in too much pain already and declines for now. Pt ready for hyst for sx.   Leiomyoma - Plan: US PELVIC COMPLETE WITH TRANSVAGINAL  BV (bacterial vaginosis) - Plan: metroNIDAZOLE (FLAGYL) 500 MG tablet, POCT Wet Prep with KOH; pos sx and wet prep. Rx flagy, no EtOH.   Trichomoniasis - Plan: metroNIDAZOLE  (FLAGYL) 500 MG tablet; pos wet prep. Rx flagyl, partner needs tx. No sex activity until both completed tx for a wk, RTO in 4 wks for TOC.    Meds ordered this encounter  Medications   metroNIDAZOLE (FLAGYL) 500 MG tablet    Sig: Take 1 tablet (500 mg total) by mouth 2 (two) times daily for 7 days.    Dispense:  14 tablet    Refill:  0    Order Specific Question:   Supervising Provider    Answer:   Gae Dry [[id number]]      Return if symptoms worsen or fail to improve.  Lanett Lasorsa B. Payten Beaumier, PA-C 01/07/2021 3:28 PM

## 2021-01-07 NOTE — Telephone Encounter (Signed)
Pt aware of neg GYN u/s results for pelvic pain. Given severity, will treat for PID since no other GI, urin, MSK sx for etiology. Pt already has Rx flagyl for trich and BV from today. RTO tomorrow for rocephin 500 mg IM and Rx doxy eRxd. RTO in 2 wks for f/u. Gon/chlam testing done today, waiting for results.

## 2021-01-07 NOTE — Telephone Encounter (Signed)
Donna Reyes please let us know if anything else is needed from [place]. Thanks.

## 2021-01-07 NOTE — Patient Instructions (Signed)
I value your feedback and you entrusting us with your care. If you get a [facility] patient survey, I would appreciate you taking the time to let us know about your experience today. Thank you! ? ? ?

## 2021-01-08 ENCOUNTER — Encounter: Payer: Self-pay | Admitting: Obstetrics and Gynecology

## 2021-01-08 ENCOUNTER — Ambulatory Visit (INDEPENDENT_AMBULATORY_CARE_PROVIDER_SITE_OTHER): Payer: No Typology Code available for payment source

## 2021-01-08 DIAGNOSIS — R102 Pelvic and perineal pain: Secondary | ICD-10-CM

## 2021-01-08 DIAGNOSIS — B9689 Other specified bacterial agents as the cause of diseases classified elsewhere: Secondary | ICD-10-CM | POA: Diagnosis not present

## 2021-01-08 DIAGNOSIS — N760 Acute vaginitis: Secondary | ICD-10-CM | POA: Diagnosis not present

## 2021-01-08 MED ORDER — CEFTRIAXONE SODIUM 500 MG IJ SOLR: 500.0000 mg | Freq: Once | INTRAMUSCULAR | Status: DC

## 2021-01-08 NOTE — Progress Notes (Signed)
Pt here for injection of Rocephin 500mg  which was reconstituted with 1% xylocaine 0.48ml and given IM right glut.  Rocephin NDC# [phone].  !% xylocaine NDC# [phone].  Pt tolerated well; wiggled just a little during inj.

## 2021-01-08 NOTE — Telephone Encounter (Signed)
I have spoke with Mrs. Donna Reyes and she is aware to bring lot number and exp date is needed to be sure test is not expired

## 2021-01-08 NOTE — Telephone Encounter (Signed)
yes

## 2021-01-11 ENCOUNTER — Ambulatory Visit: Payer: No Typology Code available for payment source

## 2021-01-11 LAB — CERVICOVAGINAL ANCILLARY ONLY
Chlamydia: NEGATIVE
Comment: NEGATIVE
Comment: NORMAL
Neisseria Gonorrhea: NEGATIVE

## 2021-01-11 NOTE — Progress Notes (Signed)
Pls call pt to see how her pain is with abx tx?

## 2021-01-12 NOTE — Progress Notes (Signed)
Good. Cont to follow, has f/u in 1 1/2 wks.

## 2021-01-17 ENCOUNTER — Other Ambulatory Visit: Payer: Self-pay | Admitting: Family Medicine

## 2021-01-17 NOTE — Telephone Encounter (Signed)
Requested Prescriptions  Pending Prescriptions Disp Refills  . OZEMPIC, 1 MG/DOSE, 4 MG/3ML SOPN [Pharmacy Med Name: OZEMPIC 1 MG/DOSE (4 MG/3 ML)] 9 mL 1    Sig: INJECT 1 MG INTO THE SKIN ONCE A WEEK.     Endocrinology:  Diabetes - GLP-1 Receptor Agonists Passed - 01/17/2021  9:33 AM      Passed - HBA1C is between 0 and 7.9 and within 180 days    HbA1c, POC (prediabetic range)  Date Value Ref Range Status  11/30/2018 6.2 5.7 - 6.4 % Final   Hgb A1c MFr Bld  Date Value Ref Range Status  11/10/2020 6.6 (H) <5.7 % of total Hgb Final    Comment:    For someone without known diabetes, a hemoglobin A1c value of 6.5% or greater indicates that they may have  diabetes and this should be confirmed with a follow-up  test. . For someone with known diabetes, a value <7% indicates  that their diabetes is well controlled and a value  greater than or equal to 7% indicates suboptimal  control. A1c targets should be individualized based on  duration of diabetes, age, comorbid conditions, and  other considerations. . Currently, no consensus exists regarding use of hemoglobin A1c for diagnosis of diabetes for children. Renella Cunas - Valid encounter within last 6 months    Recent Outpatient Visits          2 months ago Controlled type 2 diabetes mellitus with other skin complication, without long-term current use of insulin [facility])   [facility] [place], Kristeen Miss, PA-C   5 months ago Moderate asthma with exacerbation, unspecified whether persistent   [facility] Delsa Grana, PA-C   6 months ago Controlled type 2 diabetes mellitus with other skin complication, without long-term current use of insulin [facility])   [facility] Delsa Grana, PA-C   8 months ago Essential hypertension   [facility] Delsa Grana, PA-C   9 months ago Essential hypertension   [facility] Delsa Grana, PA-C      Future  Appointments            In 3 months Delsa Grana, PA-C [facility], [facility]

## 2021-01-21 ENCOUNTER — Other Ambulatory Visit (HOSPITAL_COMMUNITY)
Admission: RE | Admit: 2021-01-21 | Discharge: 2021-01-21 | Disposition: A | Discharge status: Home / Self Care | Payer: No Typology Code available for payment source | Source: Ambulatory Visit | Attending: Obstetrics and Gynecology | Admitting: Obstetrics and Gynecology

## 2021-01-21 ENCOUNTER — Ambulatory Visit: Payer: No Typology Code available for payment source | Attending: Pulmonary Disease

## 2021-01-21 ENCOUNTER — Ambulatory Visit (INDEPENDENT_AMBULATORY_CARE_PROVIDER_SITE_OTHER): Payer: No Typology Code available for payment source | Admitting: Obstetrics and Gynecology

## 2021-01-21 ENCOUNTER — Encounter: Payer: Self-pay | Admitting: Obstetrics and Gynecology

## 2021-01-21 ENCOUNTER — Other Ambulatory Visit: Payer: Self-pay

## 2021-01-21 VITALS — BP 110/70 | Ht 62.0 in | Wt 199.0 lb

## 2021-01-21 DIAGNOSIS — A599 Trichomoniasis, unspecified: Secondary | ICD-10-CM | POA: Insufficient documentation

## 2021-01-21 DIAGNOSIS — J45909 Unspecified asthma, uncomplicated: Secondary | ICD-10-CM | POA: Diagnosis not present

## 2021-01-21 DIAGNOSIS — Z7951 Long term (current) use of inhaled steroids: Secondary | ICD-10-CM | POA: Insufficient documentation

## 2021-01-21 DIAGNOSIS — N939 Abnormal uterine and vaginal bleeding, unspecified: Secondary | ICD-10-CM

## 2021-01-21 DIAGNOSIS — R102 Pelvic and perineal pain: Secondary | ICD-10-CM

## 2021-01-21 MED ORDER — NORETHINDRONE ACETATE 5 MG PO TABS: 5.0000 mg | ORAL_TABLET | Freq: Every day | ORAL | 0 refills | Status: DC

## 2021-01-21 NOTE — Progress Notes (Signed)
Donna Grana, PA-C   Chief Complaint  Patient presents with   Follow-up    No concerns    HPI:      [name] is a 45 y.o. [id number] whose LMP was No LMP recorded. (Menstrual status: Irregular Periods)., presents today for pelvic pain f/u (presumed non-gon/chlam PID) from 01/07/21. Treated with abx and sx resolved. Doing well. Pt also with BV and trich, sx resolved with flagyl tx. Due for TOC now. No longer sex active with husband since he refused tx.   01/07/21 NOTE: sharp, achy, constant pelvic pain for 5 days. Taking ibup 600 mg Q6 hrs and heating pad without relief, affecting her sleep. Also with nausea, no other GI sx, no urin sx, no fevers. Hx of ovar cysts in past and pain doesn't feel similar. Per 7/21 GYN u/s, pt has Fibroid 1: 33.1 x 28.9 x 26.2 mm intramural fundal left. EM=19 mm but pt wasn't having AUB at the time. Has had neg EMB in past per pt.   Pt also with AUB. Pt with hx of menorrhagia with IDA. Started depo last yr and had constant bleeding.  Last depo given 11/21. Pt stopped bleeding for 4 months 2/22, but now having frequent light bleeding/spotting since 9/22, stopping for a wk or so, then restarting for another month. Currently bleeding today for past 3 wks. Changing products BID to TID. No dysmen. Sees hematology and had normal H/H on 8/22 labs. Pt was interested in hyst for bleeding and leio tx. Did nexplanon in past with bleeding. Neg EMB in past per pt; declined EMB at 01/07/21 appt due to pain, declines again today. Hx of HTN.   01/07/21 GYN u/s: IMPRESSION: Questionable transmural leiomyoma at anterior upper uterus 2.9 cm diameter. EM=9 mm.   Past Medical History:  Diagnosis Date   Allergy    Anemia    Anxiety 03/05/2019   Asthma    BRCA negative 10/2019   MyRisk neg; IBIS=7.8%   Diabetes mellitus without complication ([place])    type 2   Family history of adverse reaction to anesthesia    Father - slow to wake   Family history of pancreatic  cancer    Hypertension    Sleep apnea    Wears contact lenses     Past Surgical History:  Procedure Laterality Date   AXILLARY LYMPH NODE BIOPSY Right 05/25/2017   LYMPHADENITIS. NEGATIVE FOR MALIGNANCY.    BREAST BIOPSY Right 05/25/2017   neg/NEGATIVE FOR CARCINOMA. INFLAMMATION AND SQUAMOUS METAPLASIA OF A FOCAL DUCT    CESAREAN SECTION     times 3   COLONOSCOPY WITH PROPOFOL N/A 06/23/2020   Procedure: COLONOSCOPY WITH PROPOFOL;  Surgeon: Virgel Manifold, MD;  Location: [place];  Service: Endoscopy;  Laterality: N/A;  Diabetic  sleep apnea priority 4   TUBAL LIGATION      Family History  Problem Relation Age of Onset   Diabetes Mother    Heart failure Mother    Kidney disease Mother    Diabetes Maternal Grandmother    Breast cancer Maternal Grandmother 27   Hypertension Father    Hyperlipidemia Father    Atrial fibrillation Sister    ADD / ADHD Son    Heart attack Maternal Grandfather    Diabetes Paternal Grandmother    Hypertension Paternal Grandmother    Prostate cancer Paternal Grandfather    Colon cancer Paternal Grandfather 47   Polycystic ovary syndrome Sister    Polycystic ovary  syndrome Sister    Pancreatic cancer Paternal Aunt 60   Ovarian cancer Neg Hx     Social History   Socioeconomic History   Marital status: Married    Spouse name: Not on file   Number of children: 5   Years of education: 12   Highest education level: Associate degree: academic program  Occupational History   Not on file  Tobacco Use   Smoking status: Never   Smokeless tobacco: Never  Vaping Use   Vaping Use: Never used  Substance and Sexual Activity   Alcohol use: No   Drug use: No   Sexual activity: Yes    Partners: Male    Birth control/protection: Surgical    Comment: tubal ligation   Other Topics Concern   Not on file  Social History Narrative   Not on file   Social Determinants of Health   Financial Resource Strain: Not on file  Food  Insecurity: Not on file  Transportation Needs: Not on file  Physical Activity: Not on file  Stress: Not on file  Social Connections: Not on file  Intimate Partner Violence: Not on file    Outpatient Medications Prior to Visit  Medication Sig Dispense Refill   albuterol (PROVENTIL) (2.5 MG/3ML) 0.083% nebulizer solution Take 3 mLs (2.5 mg total) by nebulization every 6 (six) hours as needed for wheezing or shortness of breath. 150 mL 1   albuterol (VENTOLIN HFA) 108 (90 Base) MCG/ACT inhaler Inhale 2 puffs into the lungs every 6 (six) hours as needed for wheezing or shortness of breath. May also use 2 puffs prior to exercise for EIB 8 g 5   amLODipine (NORVASC) 5 MG tablet Take 1 tablet (5 mg total) by mouth at bedtime. 90 tablet 3   losartan-hydrochlorothiazide (HYZAAR) 100-12.5 MG tablet TAKE 1 TABLET BY MOUTH EVERY DAY (STOP LOSARTAN) 90 tablet 3   metFORMIN (GLUCOPHAGE-XR) 500 MG 24 hr tablet Take 1 tablet (500 mg total) by mouth daily with breakfast. 90 tablet 3   OZEMPIC, 1 MG/DOSE, 4 MG/3ML SOPN INJECT 1 MG INTO THE SKIN ONCE A WEEK. 9 mL 1   rosuvastatin (CRESTOR) 5 MG tablet TAKE 1 TABLET BY MOUTH EVERYDAY AT BEDTIME 90 tablet 0   SYMBICORT 160-4.5 MCG/ACT inhaler Inhale 2 puffs into the lungs in the morning and at bedtime. 10.2 g 11   doxycycline (VIBRAMYCIN) 100 MG capsule Take 1 capsule (100 mg total) by mouth 2 (two) times daily for 14 days. 28 capsule 0   No facility-administered medications prior to visit.      ROS:  Review of Systems  Constitutional:  Negative for fever.  Gastrointestinal:  Negative for blood in stool, constipation, diarrhea, nausea and vomiting.  Genitourinary:  Positive for menstrual problem. Negative for dyspareunia, dysuria, flank pain, frequency, hematuria, urgency, vaginal bleeding, vaginal discharge and vaginal pain.  Musculoskeletal:  Negative for back pain.  Skin:  Negative for rash.  BREAST: No symptoms   OBJECTIVE:   Vitals:  BP 110/70    Ht '5\' 2"'  (1.575 m)   Wt 199 lb (90.3 kg)   BMI 36.40 kg/m   Physical Exam Vitals reviewed.  Constitutional:      Appearance: She is well-developed.  Pulmonary:     Effort: Pulmonary effort is normal.  Genitourinary:    General: Normal vulva.     Pubic Area: No rash.      Labia:        Right: No rash, tenderness or lesion.  Left: No rash, tenderness or lesion.      Vagina: Bleeding present. No vaginal discharge, erythema or tenderness.     Cervix: Normal.     Uterus: Normal. Not enlarged and not tender.      Adnexa: Right adnexa normal and left adnexa normal.       Right: No mass or tenderness.         Left: No mass or tenderness.    Musculoskeletal:        General: Normal range of motion.     Cervical back: Normal range of motion.  Skin:    General: Skin is warm and dry.  Neurological:     General: No focal deficit present.     Mental Status: She is alert and oriented to person, place, and time.  Psychiatric:        Mood and Affect: Mood normal.        Behavior: Behavior normal.        Thought Content: Thought content normal.        Judgment: Judgment normal.    Assessment/Plan: Pelvic pain--sx resolved after abx tx. F/u prn.   Trichomoniasis - Plan: Cervicovaginal ancillary only; TOC today.   Abnormal uterine bleeding (AUB) - Plan: norethindrone (AYGESTIN) 5 MG tablet; discussed tx options including prog only hormones, IUD, ablation, hyst. Pt aware leio is small and hyst not needed if she prefers to try other option. Sx still could be related to depo use (last inj given 11/21). Pt would like ablation, declines EMB today. Will f/u with MD for ablation consult with EMB. Rx aygestin in meantime. F/u prn.    Meds ordered this encounter  Medications   norethindrone (AYGESTIN) 5 MG tablet    Sig: Take 1 tablet (5 mg total) by mouth daily for 10 days.    Dispense:  10 tablet    Refill:  0    Order Specific Question:   Supervising Provider    Answer:   Gae Dry [[id number]]      Return if symptoms worsen or fail to improve.  Tahjae Durr B. Micheil Klaus, PA-C 01/21/2021 12:02 PM

## 2021-01-25 LAB — CERVICOVAGINAL ANCILLARY ONLY
Comment: NEGATIVE
Trichomonas: NEGATIVE

## 2021-02-05 LAB — HM DIABETES EYE EXAM

## 2021-02-08 ENCOUNTER — Ambulatory Visit (INDEPENDENT_AMBULATORY_CARE_PROVIDER_SITE_OTHER): Payer: No Typology Code available for payment source | Admitting: Family Medicine

## 2021-02-08 ENCOUNTER — Other Ambulatory Visit: Payer: Self-pay

## 2021-02-08 ENCOUNTER — Encounter: Payer: Self-pay | Admitting: Family Medicine

## 2021-02-08 VITALS — BP 118/78 | HR 96 | Temp 98.1°F | Resp 16 | Ht 62.0 in | Wt 197.2 lb

## 2021-02-08 DIAGNOSIS — R1013 Epigastric pain: Secondary | ICD-10-CM

## 2021-02-08 DIAGNOSIS — R109 Unspecified abdominal pain: Secondary | ICD-10-CM | POA: Insufficient documentation

## 2021-02-08 NOTE — Patient Instructions (Addendum)
It was great to see you!  Our plans for today:  - Try tylenol and Tums for pain.  - Skip your ozempic dose for this week.  - Come back next week for follow up.   We are checking some labs today, we will release these results to your MyChart.  Take care and seek immediate care sooner if you develop any concerns.   Dr. Ky Barban

## 2021-02-08 NOTE — Progress Notes (Signed)
   SUBJECTIVE:   CHIEF COMPLAINT / HPI:   ABDOMINAL PAIN  - recent non gon/chlam PID 10/6 s/p treatment, 10/20 TOC negative.  - h/o uterine fibroids and ovarian cyst. Planning for ablation. - current pain feels different than previous GYN pain. - no abnl vaginal discharge - h/o irregular periods, previously on depo. Has BTL. LMP unsure. - has noticed increase in pain few days after ozempic dose, denies recent changes in dose. Is due for dose today.  - denies alcohol use - h/o GERD, not currently on PPI - no bowel habit changes.   Duration: 3 weeks Quality: sharp Location:   epigastric Episode duration:  Radiation: no Frequency: constant Alleviating factors: rest Aggravating factors: coffee Treatments attempted: tylenol Fever: no Nausea: yes Vomiting: no Weight loss: unsure Decreased appetite: yes Diarrhea: no Constipation: no Blood in stool: no Heartburn: yes Jaundice: no Rash: no Dysuria/urinary frequency: no Hematuria: no History of sexually transmitted disease: yes Recurrent NSAID use: no   OBJECTIVE:   BP 118/78   Pulse 96   Temp 98.1 F (36.7 C) (Oral)   Resp 16   Ht 5\' 2"  (1.575 m)   Wt 197 lb 3.2 oz (89.4 kg)   SpO2 97%   BMI 36.07 kg/m   Gen: well appearing, in NAD Card: RRR Lungs: CTAB Abd: soft, TTP in lower quadrants, +BS. No rebound tenderness or guarding. Negative Murphy sign.  Ext: WWP, no edema  Limited US of Abdomen: Liver and R kidney with normal echogenicity. No hydronephrosis. Gallbladder without stones or wall thickening. Normal appearing anteverted uterus.  Impression: No acute findings. No cholecystitis.   ASSESSMENT/PLAN:   Abdominal pain Epigastric pain x3 weeks. Unclear etiology. Murphy sign negative, [place] without evidence of cholecystitis. STI TOC testing negative. Does have h/o uterine fibroids but pain different than previous. Possible mild pancreatitis given epigastric pain worse with ozempic dosing though no recent changes  in dosing. Will hold this week's ozempic dose and obtain lipase to evaluate further.  Does have h/o GERD, recommend Tums and avoidance of NSAID and triggers. Obtaining labs. F/u in 1 week. Consider trial of PPI at that time if pain not changed with holding ozempic.      Myles Gip, DO

## 2021-02-08 NOTE — Assessment & Plan Note (Signed)
Epigastric pain x3 weeks. Unclear etiology. Murphy sign negative, [place] without evidence of cholecystitis. STI TOC testing negative. Does have h/o uterine fibroids but pain different than previous. Possible mild pancreatitis given epigastric pain worse with ozempic dosing though no recent changes in dosing. Will hold this week's ozempic dose and obtain lipase to evaluate further.  Does have h/o GERD, recommend Tums and avoidance of NSAID and triggers. Obtaining labs. F/u in 1 week. Consider trial of PPI at that time if pain not changed with holding ozempic.

## 2021-02-09 LAB — COMPREHENSIVE METABOLIC PANEL
AG Ratio: 1.4 (calc) (ref 1.0–2.5)
ALT: 10 U/L (ref 6–29)
AST: 12 U/L (ref 10–35)
Albumin: 4.4 g/dL (ref 3.6–5.1)
Alkaline phosphatase (APISO): 57 U/L (ref 31–125)
BUN: 14 mg/dL (ref 7–25)
CO2: 27 mmol/L (ref 20–32)
Calcium: 9.8 mg/dL (ref 8.6–10.2)
Chloride: 103 mmol/L (ref 98–110)
Creat: 0.78 mg/dL (ref 0.50–0.99)
Globulin: 3.1 g/dL (calc) (ref 1.9–3.7)
Glucose, Bld: 84 mg/dL (ref 65–99)
Potassium: 4.2 mmol/L (ref 3.5–5.3)
Sodium: 138 mmol/L (ref 135–146)
Total Bilirubin: 0.5 mg/dL (ref 0.2–1.2)
Total Protein: 7.5 g/dL (ref 6.1–8.1)

## 2021-02-09 LAB — CBC WITH DIFFERENTIAL/PLATELET
Absolute Monocytes: 396 cells/uL (ref 200–950)
Basophils Absolute: 32 cells/uL (ref 0–200)
Basophils Relative: 0.7 %
Eosinophils Absolute: 244 cells/uL (ref 15–500)
Eosinophils Relative: 5.3 %
HCT: 43.2 % (ref 35.0–45.0)
Hemoglobin: 13.9 g/dL (ref 11.7–15.5)
Lymphs Abs: 1513 cells/uL (ref 850–3900)
MCH: 27.8 pg (ref 27.0–33.0)
MCHC: 32.2 g/dL (ref 32.0–36.0)
MCV: 86.4 fL (ref 80.0–100.0)
MPV: 10.6 fL (ref 7.5–12.5)
Monocytes Relative: 8.6 %
Neutro Abs: 2415 cells/uL (ref 1500–7800)
Neutrophils Relative %: 52.5 %
Platelets: 390 10*3/uL (ref 140–400)
RBC: 5 10*6/uL (ref 3.80–5.10)
RDW: 13.2 % (ref 11.0–15.0)
Total Lymphocyte: 32.9 %
WBC: 4.6 10*3/uL (ref 3.8–10.8)

## 2021-02-09 LAB — HEMOGLOBIN A1C
Hgb A1c MFr Bld: 6.1 % of total Hgb — ABNORMAL HIGH (ref <5.7)
Mean Plasma Glucose: 128 mg/dL
eAG (mmol/L): 7.1 mmol/L

## 2021-02-09 LAB — LIPASE: Lipase: 60 U/L (ref 7–60)

## 2021-02-11 ENCOUNTER — Encounter: Payer: Self-pay | Admitting: Family Medicine

## 2021-02-15 ENCOUNTER — Other Ambulatory Visit: Payer: Self-pay

## 2021-02-15 ENCOUNTER — Encounter: Payer: Self-pay | Admitting: Family Medicine

## 2021-02-15 ENCOUNTER — Ambulatory Visit (INDEPENDENT_AMBULATORY_CARE_PROVIDER_SITE_OTHER): Payer: No Typology Code available for payment source | Admitting: Family Medicine

## 2021-02-15 VITALS — BP 120/76 | HR 91 | Temp 98.3°F | Resp 16 | Ht 62.0 in | Wt 198.4 lb

## 2021-02-15 DIAGNOSIS — R1013 Epigastric pain: Secondary | ICD-10-CM

## 2021-02-15 NOTE — Assessment & Plan Note (Signed)
Resolved with holding ozempic. Other w/u unrevealing. With most recent A1c well within goal and otherwise tolerating metformin, will continue to hold ozempic. F/u in 3 months for diabetic follow up. F/u sooner if symptoms return.

## 2021-02-15 NOTE — Progress Notes (Signed)
   SUBJECTIVE:   CHIEF COMPLAINT / HPI:   ABDOMINAL PAIN  - seen 1 week ago for 3x h/o epigastric pain, thought to be worsened few days after ozempic. Labs, bedside abd [place] unrevealing. Recommended holding ozempic dose, try Tums and avoid NSAIDs. - didn't need to take any Tums.  - no pain since last appointment. No pain today. - denies nausea, vomiting, diarrhea, consipation, blood in stool   OBJECTIVE:   BP 120/76   Pulse 91   Temp 98.3 F (36.8 C) (Oral)   Resp 16   Ht 5\' 2"  (1.575 m)   Wt 198 lb 6.4 oz (90 kg)   SpO2 98%   BMI 36.29 kg/m   Gen: well appearing, in NAD Card: Reg rate Lungs: Comfortable WOB on RA Ext: WWP   ASSESSMENT/PLAN:   Abdominal pain Resolved with holding ozempic. Other w/u unrevealing. With most recent A1c well within goal and otherwise tolerating metformin, will continue to hold ozempic. F/u in 3 months for diabetic follow up. F/u sooner if symptoms return.      Myles Gip, DO

## 2021-02-16 ENCOUNTER — Ambulatory Visit: Payer: No Typology Code available for payment source | Admitting: Family Medicine

## 2021-02-22 ENCOUNTER — Telehealth: Payer: Self-pay

## 2021-02-22 NOTE — Telephone Encounter (Signed)
Pt was calling to get update on her FMLA

## 2021-02-22 NOTE — Telephone Encounter (Signed)
Pt notified, faxed can pick up copy

## 2021-02-24 ENCOUNTER — Ambulatory Visit: Payer: No Typology Code available for payment source | Admitting: Pulmonary Disease

## 2021-03-08 ENCOUNTER — Encounter: Payer: Self-pay | Admitting: Obstetrics and Gynecology

## 2021-03-10 ENCOUNTER — Other Ambulatory Visit (HOSPITAL_COMMUNITY)
Admission: RE | Admit: 2021-03-10 | Discharge: 2021-03-10 | Disposition: A | Discharge status: Home / Self Care | Payer: No Typology Code available for payment source | Source: Ambulatory Visit | Attending: Obstetrics and Gynecology | Admitting: Obstetrics and Gynecology

## 2021-03-10 ENCOUNTER — Other Ambulatory Visit: Payer: Self-pay

## 2021-03-10 ENCOUNTER — Encounter: Payer: Self-pay | Admitting: Obstetrics and Gynecology

## 2021-03-10 ENCOUNTER — Ambulatory Visit (INDEPENDENT_AMBULATORY_CARE_PROVIDER_SITE_OTHER): Payer: No Typology Code available for payment source | Admitting: Obstetrics and Gynecology

## 2021-03-10 DIAGNOSIS — N939 Abnormal uterine and vaginal bleeding, unspecified: Secondary | ICD-10-CM | POA: Diagnosis present

## 2021-03-10 MED ORDER — NORETHINDRONE ACETATE 5 MG PO TABS: 5.0000 mg | ORAL_TABLET | Freq: Every day | ORAL | 0 refills | Status: DC

## 2021-03-10 NOTE — Progress Notes (Signed)
Donna Grana, Donna Reyes   Chief Complaint  Patient presents with   Biopsy    HPI:      Donna Reyes is a 45 y.o. [id number] whose LMP was Patient's last menstrual period was 02/22/2021 (exact date)., presents today for EMB for AUB. Been having bleeding issues for over a year with menorrhagia, IDA needing iron transfusions in past, still followed by hematology. Tried depo but had persistent bleeding. Stopped depo 2/22 and has been having irregular bleeding since, lasting wks at a time. Flow is heavy and with clots. Had GYN u/s 10/22 with Questionable transmural leiomyoma at anterior upper uterus 2.9 cm diameter. EM=9 mm. Pt declined AUB at that time. Given aygestin 10/22 for AUB for 10 days with sx control. Started bleeding again 02/22/21 and has been bleeding since. Passed softball sized clot yesterday. Changing products sometime hourly during heavy flow. Had discussed IUD, ablation, hyst. Pt would like hyst at this time, tired of bleeding issues.   Neg pap/neg HPV DNA 7/22. Not currently sex active.   01/21/21 Pt also with AUB. Pt with hx of menorrhagia with IDA. Started depo last yr and had constant bleeding.  Last depo given 11/21. Pt stopped bleeding for 4 months 2/22, but now having frequent light bleeding/spotting since 9/22, stopping for a wk or so, then restarting for another month. Currently bleeding today for past 3 wks. Changing products BID to TID. No dysmen. Sees hematology and had normal H/H on 8/22 labs. Pt was interested in hyst for bleeding and leio tx. Did nexplanon in past with bleeding. Neg EMB in past per pt; declined EMB at 01/07/21 appt due to pain, declines again today. Hx of HTN.    10/27/20 NOTE: Her menses are irregular since starting depo for menorrhagia last yr. Did 2 shots and had constant, daily bleeding, so stopped it after 11/21 injection. Next depo due 2/22 and pt then stopped bleeding for 4 months. Started with light spotting 3 wks ago. Pt with hx of  monthly menses, lasting 7 days, heavy flow, needing iron transfusions due to anemia last yr. Has 3 cm leio per 2021 GYN u/s; EM=19 mm but pt wasn't having AUB or BTB at the time. Had discussed IUD and endometrial ablation in past at [facility]. Hx of HTN.   Patient Active Problem List   Diagnosis Date Noted   Abdominal pain 02/08/2021   Leiomyoma 01/07/2021   Abnormal uterine bleeding (AUB) 01/07/2021   Trichomoniasis 01/07/2021   BV (bacterial vaginosis) 01/07/2021   Iron deficiency 12/08/2020   Seasonal allergies 07/22/2020   Class 2 severe obesity with serious comorbidity and body mass index (BMI) of 37.0 to 37.9 in adult ([place]) 03/10/2020   Hyperlipidemia associated with type 2 diabetes mellitus ([place]) 03/10/2020   Family history of pancreatic cancer 10/10/2019   Right ovarian cyst 10/10/2019   Controlled type 2 diabetes mellitus with skin complication ([place]) 46/27/0350   Menorrhagia with regular cycle 08/14/2018   Lymphadenopathy, axillary 10/27/2017   Essential hypertension 01/06/2016   Mild intermittent asthma with acute exacerbation 01/06/2016   Iron deficiency anemia 12/30/2015    Past Surgical History:  Procedure Laterality Date   AXILLARY LYMPH NODE BIOPSY Right 05/25/2017   LYMPHADENITIS. NEGATIVE FOR MALIGNANCY.    BREAST BIOPSY Right 05/25/2017   neg/NEGATIVE FOR CARCINOMA. INFLAMMATION AND SQUAMOUS METAPLASIA OF A FOCAL DUCT    CESAREAN SECTION     times 3   COLONOSCOPY WITH PROPOFOL N/A 06/23/2020   Procedure: COLONOSCOPY WITH  PROPOFOL;  Surgeon: Virgel Manifold, MD;  Location: [facility];  Service: Endoscopy;  Laterality: N/A;  Diabetic  sleep apnea priority 4   TUBAL LIGATION      Family History  Problem Relation Age of Onset   Diabetes Mother    Heart failure Mother    Kidney disease Mother    Diabetes Maternal Grandmother    Breast cancer Maternal Grandmother 59   Hypertension Father    Hyperlipidemia Father    Atrial fibrillation Sister     ADD / ADHD Son    Heart attack Maternal Grandfather    Diabetes Paternal Grandmother    Hypertension Paternal Grandmother    Prostate cancer Paternal Grandfather    Colon cancer Paternal Grandfather 68   Polycystic ovary syndrome Sister    Polycystic ovary syndrome Sister    Pancreatic cancer Paternal Aunt 40   Ovarian cancer Neg Hx     Social History   Socioeconomic History   Marital status: Married    Spouse name: Not on file   Number of children: 5   Years of education: 12   Highest education level: Associate degree: academic program  Occupational History   Not on file  Tobacco Use   Smoking status: Never   Smokeless tobacco: Never  Vaping Use   Vaping Use: Never used  Substance and Sexual Activity   Alcohol use: No   Drug use: No   Sexual activity: Yes    Partners: Male    Birth control/protection: Surgical    Comment: tubal ligation   Other Topics Concern   Not on file  Social History Narrative   Not on file   Social Determinants of Health   Financial Resource Strain: Not on file  Food Insecurity: Not on file  Transportation Needs: Not on file  Physical Activity: Not on file  Stress: Not on file  Social Connections: Not on file  Intimate Partner Violence: Not on file    Outpatient Medications Prior to Visit  Medication Sig Dispense Refill   albuterol (PROVENTIL) (2.5 MG/3ML) 0.083% nebulizer solution Take 3 mLs (2.5 mg total) by nebulization every 6 (six) hours as needed for wheezing or shortness of breath. 150 mL 1   albuterol (VENTOLIN HFA) 108 (90 Base) MCG/ACT inhaler Inhale 2 puffs into the lungs every 6 (six) hours as needed for wheezing or shortness of breath. May also use 2 puffs prior to exercise for EIB 8 g 5   losartan-hydrochlorothiazide (HYZAAR) 100-12.5 MG tablet TAKE 1 TABLET BY MOUTH EVERY DAY (STOP LOSARTAN) 90 tablet 3   metFORMIN (GLUCOPHAGE-XR) 500 MG 24 hr tablet Take 1 tablet (500 mg total) by mouth daily with breakfast. 90 tablet 3    OZEMPIC, 1 MG/DOSE, 4 MG/3ML SOPN INJECT 1 MG INTO THE SKIN ONCE A WEEK. 9 mL 1   rosuvastatin (CRESTOR) 5 MG tablet TAKE 1 TABLET BY MOUTH EVERYDAY AT BEDTIME 90 tablet 0   SYMBICORT 160-4.5 MCG/ACT inhaler Inhale 2 puffs into the lungs in the morning and at bedtime. 10.2 g 11   amLODipine (NORVASC) 5 MG tablet Take 1 tablet (5 mg total) by mouth at bedtime. (Patient not taking: Reported on 02/15/2021) 90 tablet 3   norethindrone (AYGESTIN) 5 MG tablet Take 1 tablet (5 mg total) by mouth daily for 10 days. 10 tablet 0   No facility-administered medications prior to visit.      ROS:  Review of Systems  Constitutional:  Negative for fever.  Gastrointestinal:  Negative  for blood in stool, constipation, diarrhea, nausea and vomiting.  Genitourinary:  Positive for menstrual problem. Negative for dyspareunia, dysuria, flank pain, frequency, hematuria, urgency, vaginal bleeding, vaginal discharge and vaginal pain.  Musculoskeletal:  Negative for back pain.  Skin:  Negative for rash.  BREAST: No symptoms   OBJECTIVE:   Vitals:  BP 120/80   Ht 5\' 2"  (1.575 m)   Wt 197 lb (89.4 kg)   LMP 02/22/2021 (Exact Date)   BMI 36.03 kg/m   Physical Exam Constitutional:      Appearance: Normal appearance.  Pulmonary:     Effort: Pulmonary effort is normal.  Genitourinary:    General: Normal vulva.     Labia:        Right: No rash, tenderness or lesion.        Left: No rash, tenderness or lesion.      Vagina: Bleeding present. No tenderness.     Cervix: Cervical bleeding present.  Musculoskeletal:        General: Normal range of motion.  Neurological:     Mental Status: She is alert and oriented to person, place, and time.  Psychiatric:        Judgment: Judgment normal.   Endometrial Biopsy After discussion with the patient regarding her abnormal uterine bleeding I recommended that she proceed with an endometrial biopsy for further diagnosis. The risks, benefits, alternatives, and  indications for an endometrial biopsy were discussed with the patient in detail. She understood the risks including infection, bleeding.  Verbal consent was obtained.   PROCEDURE NOTE:  [place] endometrial biopsy was performed using aseptic technique with iodine preparation.  The uterus was sounded to a length of 10+ cm (couldn't see well, pt in pain with procedure).  Adequate sampling was obtained with minimal blood loss.  The patient tolerated the procedure well.  Disposition will be pending pathology.   Assessment/Plan: Abnormal uterine bleeding (AUB) - Plan: norethindrone (AYGESTIN) 5 MG tablet, Surgical pathology; EMB today. Will f/u with results. Rx aygestin now, RTO with MD for hyst consult. F/u sooner prn.    Meds ordered this encounter  Medications   norethindrone (AYGESTIN) 5 MG tablet    Sig: Take 1 tablet (5 mg total) by mouth daily for 14 days.    Dispense:  14 tablet    Refill:  0    Order Specific Question:   Supervising Provider    Answer:   Gae Dry [[id number]]       Return in about 2 weeks (around 03/24/2021) for with [facility] for hyst consult.  Erron Wengert B. Ayham Word, Donna Reyes 03/10/2021 3:09 PM

## 2021-03-12 LAB — SURGICAL PATHOLOGY

## 2021-03-24 ENCOUNTER — Encounter: Payer: Self-pay | Admitting: Obstetrics & Gynecology

## 2021-03-24 ENCOUNTER — Ambulatory Visit (INDEPENDENT_AMBULATORY_CARE_PROVIDER_SITE_OTHER): Payer: No Typology Code available for payment source | Admitting: Pulmonary Disease

## 2021-03-24 ENCOUNTER — Encounter: Payer: Self-pay | Admitting: Pulmonary Disease

## 2021-03-24 ENCOUNTER — Ambulatory Visit (INDEPENDENT_AMBULATORY_CARE_PROVIDER_SITE_OTHER): Payer: No Typology Code available for payment source | Admitting: Obstetrics & Gynecology

## 2021-03-24 ENCOUNTER — Other Ambulatory Visit: Payer: Self-pay

## 2021-03-24 VITALS — BP 126/84 | Ht 62.0 in | Wt 201.0 lb

## 2021-03-24 VITALS — BP 140/90 | HR 83 | Temp 97.5°F | Ht 62.0 in | Wt 198.8 lb

## 2021-03-24 DIAGNOSIS — R102 Pelvic and perineal pain: Secondary | ICD-10-CM | POA: Diagnosis not present

## 2021-03-24 DIAGNOSIS — J4540 Moderate persistent asthma, uncomplicated: Secondary | ICD-10-CM | POA: Diagnosis not present

## 2021-03-24 DIAGNOSIS — E669 Obesity, unspecified: Secondary | ICD-10-CM | POA: Diagnosis not present

## 2021-03-24 DIAGNOSIS — D219 Benign neoplasm of connective and other soft tissue, unspecified: Secondary | ICD-10-CM | POA: Diagnosis not present

## 2021-03-24 DIAGNOSIS — N939 Abnormal uterine and vaginal bleeding, unspecified: Secondary | ICD-10-CM

## 2021-03-24 MED ORDER — NORETHINDRONE ACETATE 5 MG PO TABS: 5.0000 mg | ORAL_TABLET | Freq: Every day | ORAL | 2 refills | Status: DC

## 2021-03-24 NOTE — Patient Instructions (Signed)
Total Laparoscopic Hysterectomy A total laparoscopic hysterectomy is a minimally invasive surgery to remove the uterus and cervix. The fallopian tubes and ovaries can also be removed during this surgery, if necessary. This procedure may be done to treat problems such as: Growths in the uterus (uterine fibroids) that are not cancer but cause symptoms. A condition that causes the lining of the uterus to grow in other areas (endometriosis). Problems with pelvic support. Cancer of the cervix, ovaries, uterus, or tissue that lines the uterus (endometrium). Excessive bleeding in the uterus. After this procedure, you will no longer be able to have a baby, and you will no longer have a menstrual period. Tell a health care provider about: Any allergies you have. All medicines you are taking, including vitamins, herbs, eye drops, creams, and over-the-counter medicines. Any problems you or family members have had with anesthetic medicines. Any blood disorders you have. Any surgeries you have had. Any medical conditions you have. Whether you are pregnant or may be pregnant. What are the risks? Generally, this is a safe procedure. However, problems may occur, including: Infection. Bleeding. Blood clots in the legs or lungs. Allergic reactions to medicines. Damage to nearby structures or organs. Having to change from this surgery to one in which a large incision is made in the abdomen (abdominal hysterectomy). What happens before the procedure? Staying hydrated Follow instructions from your health care provider about hydration, which may include: Up to 2 hours before the procedure - you may continue to drink clear liquids, such as water, clear fruit juice, black coffee, and plain tea.  Eating and drinking restrictions Follow instructions from your health care provider about eating and drinking, which may include: 8 hours before the procedure - stop eating heavy meals or foods, such as meat, fried  foods, or fatty foods. 6 hours before the procedure - stop eating light meals or foods, such as toast or cereal. 6 hours before the procedure - stop drinking milk or drinks that contain milk. 2 hours before the procedure - stop drinking clear liquids. Medicines Ask your health care provider about: Changing or stopping your regular medicines. This is especially important if you are taking diabetes medicines or blood thinners. Taking medicines such as aspirin and ibuprofen. These medicines can thin your blood. Do not take these medicines unless your health care provider tells you to take them. Taking over-the-counter medicines, vitamins, herbs, and supplements. You may be asked to take medicine that helps you have a bowel movement (laxative) to prevent constipation. General instructions If you were asked to do bowel preparation before the procedure, follow instructions from your health care provider. This procedure can affect the way you feel about yourself. Talk with your health care provider about the physical and emotional changes hysterectomy may cause. Do not use any products that contain nicotine or tobacco for at least 4 weeks before the procedure. These products include cigarettes, chewing tobacco, and vaping devices, such as e-cigarettes. If you need help quitting, ask your health care provider. Plan to have a responsible adult take you home from the hospital or clinic. Plan to have a responsible adult care for you for the time you are told after you leave the hospital or clinic. This is important. Surgery safety Ask your health care provider: How your surgery site will be marked. What steps will be taken to help prevent infection. These may include: Removing hair at the surgery site. Washing skin with a germ-killing soap. Receiving antibiotic medicine. What happens during the  procedure? An IV will be inserted into one of your veins. You will be given one or more of the following: A  medicine to help you relax (sedative). A medicine to make you fall asleep (general anesthetic). A medicine to numb the area (local anesthetic). A medicine that is injected into your spine to numb the area below and slightly above the injection site (spinal anesthetic). A medicine that is injected into an area of your body to numb everything below the injection site (regional anesthetic). A gas will be used to inflate your abdomen. This will allow your surgeon to look inside your abdomen and do the surgery. Three or four small incisions will be made in your abdomen. A small device with a light (laparoscope) will be inserted into one of your incisions. Surgical instruments will be inserted through the other incisions in order to perform the procedure. Your uterus and cervix may be removed through your vagina or cut into small pieces and removed through the small incisions. Any other organs that need to be removed will also be removed this way. The gas will be released from inside your abdomen. Your incisions will be closed with stitches (sutures), skin glue, or adhesive strips. A bandage (dressing) may be placed over your incisions. The procedure may vary among health care providers and hospitals. What happens after the procedure? Your blood pressure, heart rate, breathing rate, and blood oxygen level will be monitored until you leave the hospital or clinic. You will be given medicine for pain as needed. You will be encouraged to walk as soon as possible. You will also use a device to help you breathe or do breathing exercises to keep your lungs clear. You may have to wear compression stockings. These stockings help to prevent blood clots and reduce swelling in your legs. You will need to wear a sanitary pad for vaginal discharge or bleeding. Summary Total laparoscopic hysterectomy is a procedure to remove your uterus, cervix, and sometimes the fallopian tubes and ovaries. This procedure can  affect the way you feel about yourself. Talk with your health care provider about the physical and emotional changes hysterectomy may cause. After this procedure, you will no longer be able to have a baby, and you will no longer have a menstrual period. You will be given pain medicine to control discomfort after this procedure. Plan to have a responsible adult take you home from the hospital or clinic. This information is not intended to replace advice given to you by your health care provider. Make sure you discuss any questions you have with your health care provider. Document Revised: 11/22/2019 Document Reviewed: 11/22/2019 Elsevier Patient Education  [place].

## 2021-03-24 NOTE — Progress Notes (Signed)
HPI:      [name] is a 45 y.o. [id number] who LMP was Patient's last menstrual period was 02/22/2021 (exact date)., presents today for a problem visit.  She complains of menometrorrhagia that  began several months ago (if not years) and its severity is described as severe.  She has periods now that are lasting weeks, associated w clots, leads to anemia and need for iron infusions w hematology, refractory to Depo, Nexplanon, and Aygestin in past, and they are associated with moderate menstrual cramping.  She has used the following for attempts at control: tampon and pad.  Previous evaluation: ultrasound showing 3 cm fibroid.  Measurements: 12.6 x 6.2 x 6.7 cm = volume: 276 mL. Anteverted and slightly retroflexed. Heterogeneous myometrium. Questionable leiomyoma anteriorly at upper uterus, transmural, 2.9 x 2.3 x 2.6 cm. No definite additional mass is seen.  EMB and PAP normal  No plans for fertility.  No s/sx menopause. Prior CS x3  PMHx: She  has a past medical history of Allergy, Anemia, Anxiety (03/05/2019), Asthma, BRCA negative (10/2019), Diabetes mellitus without complication ([place]), Family history of adverse reaction to anesthesia, Family history of pancreatic cancer, Hypertension, Sleep apnea, and Wears contact lenses. Also,  has a past surgical history that includes Cesarean section; Tubal ligation; Breast biopsy (Right, 05/25/2017); Axillary lymph node biopsy (Right, 05/25/2017); and Colonoscopy with propofol (N/A, 06/23/2020)., family history includes ADD / ADHD in her son; Atrial fibrillation in her sister; Breast cancer (age of onset: 6) in her maternal grandmother; Colon cancer (age of onset: 74) in her paternal grandfather; Diabetes in her maternal grandmother, mother, and paternal grandmother; Heart attack in her maternal grandfather; Heart failure in her mother; Hyperlipidemia in her father; Hypertension in her father and paternal grandmother; Kidney disease in her  mother; Pancreatic cancer (age of onset: 35) in her paternal aunt; Polycystic ovary syndrome in her sister and sister; Prostate cancer in her paternal grandfather.,  reports that she has never smoked. She has never used smokeless tobacco. She reports that she does not drink alcohol and does not use drugs.  She  Current Outpatient Medications:    albuterol (PROVENTIL) (2.5 MG/3ML) 0.083% nebulizer solution, Take 3 mLs (2.5 mg total) by nebulization every 6 (six) hours as needed for wheezing or shortness of breath., Disp: 150 mL, Rfl: 1   albuterol (VENTOLIN HFA) 108 (90 Base) MCG/ACT inhaler, Inhale 2 puffs into the lungs every 6 (six) hours as needed for wheezing or shortness of breath. May also use 2 puffs prior to exercise for EIB, Disp: 8 g, Rfl: 5   losartan-hydrochlorothiazide (HYZAAR) 100-12.5 MG tablet, TAKE 1 TABLET BY MOUTH EVERY DAY (STOP LOSARTAN), Disp: 90 tablet, Rfl: 3   metFORMIN (GLUCOPHAGE-XR) 500 MG 24 hr tablet, Take 1 tablet (500 mg total) by mouth daily with breakfast., Disp: 90 tablet, Rfl: 3   norethindrone (AYGESTIN) 5 MG tablet, Take 1 tablet (5 mg total) by mouth daily for 14 days., Disp: 14 tablet, Rfl: 0   OZEMPIC, 1 MG/DOSE, 4 MG/3ML SOPN, INJECT 1 MG INTO THE SKIN ONCE A WEEK., Disp: 9 mL, Rfl: 1   rosuvastatin (CRESTOR) 5 MG tablet, TAKE 1 TABLET BY MOUTH EVERYDAY AT BEDTIME, Disp: 90 tablet, Rfl: 0   SYMBICORT 160-4.5 MCG/ACT inhaler, Inhale 2 puffs into the lungs in the morning and at bedtime., Disp: 10.2 g, Rfl: 11  Also, is allergic to capsicum, basil oil, and lisinopril.  Review of Systems  All other systems reviewed and are negative.  Objective: BP 126/84    Ht '5\' 2"'  (1.575 m)    Wt 201 lb (91.2 kg)    LMP 02/22/2021 (Exact Date)    BMI 36.76 kg/m  Physical Exam Constitutional:      General: She is not in acute distress.    Appearance: She is well-developed.  Musculoskeletal:        General: Normal range of motion.  Neurological:     Mental Status: She  is alert and oriented to person, place, and time.  Skin:    General: Skin is warm and dry.  Vitals reviewed.    ASSESSMENT/PLAN:  menometrorrhagia and uterine fibroids   ICD-10-CM   1. Abnormal uterine bleeding (AUB)  N93.9 norethindrone (AYGESTIN) 5 MG tablet    2. Pelvic pain  R10.2     3. Leiomyoma  D21.9        Options discussed, pt prefers definitive surgical management by way of hysterectomy.  Info gv.  Plan TLH, BS in January Aygestin to try to help until then   Donna Applebaum, MD, [facility], [facility] 03/24/2021  4:48 PM

## 2021-03-24 NOTE — Progress Notes (Signed)
Subjective:    Patient ID: Donna Reyes, female    DOB: 1975/04/18, 45 y.o.   MRN: [id number]  Patient Care Team: Margarita Mail, DO as PCP - General (Internal Medicine) Lemar Livings, Merrily Pew, MD (General Surgery) Rosey Bath, MD (Inactive) as Referring Physician (Hematology and Oncology)  Chief Complaint  Patient presents with   Follow-up    C/o wheezing yesterday but subsided with albuterol.      HPI Patient is a 45 year old lifelong never smoker who carries a diagnosis of asthma.  She follows for management of more frequent asthma exacerbations.  We initially evaluated her on 10 November 2020.  The patient was first diagnosed with asthma at approximately 45 years of age.  This was diagnosed when she resided in [place].  She had done relatively well until a year ago when she noticed that she had more frequent asthma attacks and has required multiple steroid tapers.  She notes that cigar smoke, charcoal burning and pet dander do aggravate her symptoms.  There are pet rabbits in the home that actually are in her office area.  She also has 2 dogs in the home.  No exotic pets.  She has been previously on Flovent and then subsequently on Advair.  Currently she is only on as needed albuterol.  She noted relatively good control with Advair previously however, did not tolerate the powdered preparation.  At her initial visit she was given a trial of Symbicort 160/4.5, 2 inhalations twice a day.  This is working well for her.  She had an episode of wheezing yesterday however this resolved with albuterol.  She has occasional chest pain and tachypalpitations but not associated with her asthma symptoms.  She has not had any fevers, chills or sweats.  Occasional dry cough but no sputum production no hemoptysis.  No GERD symptoms.  Recently she noted nocturnal awakenings with dyspnea that her responsive to albuterol, she has not had these since starting on Symbicort.  She has not had any  lower extremity edema.  No calf tenderness   She had pulmonary function testing performed in October that were consistent with mild obstructive airways disease with air trapping and response to bronchodilators with regards to airway resistance.  This is consistent with her asthma diagnosis.  Allergen panel was negative.   Review of Systems A 10 point review of systems was performed and it is as noted above otherwise negative.  Patient Active Problem List   Diagnosis Date Noted   Pelvic pain    Abdominal pain 02/08/2021   Leiomyoma 01/07/2021   Abnormal uterine bleeding (AUB) 01/07/2021   Trichomoniasis 01/07/2021   BV (bacterial vaginosis) 01/07/2021   Iron deficiency 12/08/2020   Seasonal allergies 07/22/2020   Class 2 severe obesity with serious comorbidity and body mass index (BMI) of 37.0 to 37.9 in adult [facility]) 03/10/2020   Hyperlipidemia associated with type 2 diabetes mellitus (HCC) 03/10/2020   Family history of pancreatic cancer 10/10/2019   Right ovarian cyst 10/10/2019   Controlled type 2 diabetes mellitus with skin complication (HCC) 09/24/2018   Lymphadenopathy, axillary 10/27/2017   Essential hypertension 01/06/2016   Asthma 01/06/2016   Iron deficiency anemia 12/30/2015   Social History   Tobacco Use   Smoking status: Never   Smokeless tobacco: Never  Substance Use Topics   Alcohol use: No   Allergies  Allergen Reactions   Capsicum Shortness Of Breath    Jalapeno peppers   Basil Oil Swelling    Tongue  swelling   Lisinopril     fatigue   Medications were reviewed with the patient.  Immunization History  Administered Date(s) Administered   Influenza,inj,Quad PF,6+ Mos 11/30/2018, 02/04/2020, 01/27/2021   Influenza-Unspecified 01/19/2018   PFIZER(Purple Top)SARS-COV-2 Vaccination 10/04/2019, 10/26/2019, 04/28/2020   PNEUMOCOCCAL CONJUGATE-20 11/10/2020   Pneumococcal Polysaccharide-23 09/11/2019   Tdap 09/13/2017   Unspecified SARS-COV-2 Vaccination  12/22/2021      Objective:   Physical Exam BP 140/90 (BP Location: Left Arm, Cuff Size: Normal)   Pulse 83   Temp (!) 97.5 F (36.4 C) (Temporal)   Ht 5\' 2"  (1.575 m)   Wt 198 lb 12.8 oz (90.2 kg)   LMP 02/22/2021 (Exact Date)   SpO2 100%   BMI 36.36 kg/m   SpO2: 100 % O2 Device: None (Room air)  GENERAL: Obese woman, no acute distress, fully ambulatory.  No conversational dyspnea. HEAD: Normocephalic, atraumatic.  EYES: Pupils equal, round, reactive to light.  No scleral icterus.  MOUTH: Nose/mouth/throat not examined due to masking requirements for COVID 19. NECK: Supple. No thyromegaly. Trachea midline. No JVD.  No adenopathy. PULMONARY: Good air entry bilaterally.  Coarse, otherwise no adventitious sounds. CARDIOVASCULAR: S1 and S2. Regular rate and rhythm.  No rubs, murmurs or gallops heard. ABDOMEN: Obese otherwise benign. MUSCULOSKELETAL: No joint deformity, no clubbing, no edema.  NEUROLOGIC: No focal deficit, no gait disturbance, speech is fluent. SKIN: Intact,warm,dry. PSYCH: Mood and behavior normal.     Assessment & Plan:     ICD-10-CM   1. Moderate asthma without complication, unspecified whether persistent  J45.909    Continue Symbicort 160/4.5 Continue as needed albuterol ALLERGEN AVOIDANCE (animal danders)    2. Obesity (BMI 35.0-39.9 without comorbidity)  E66.9    Encourage weight loss     Appears to be well compensated currently.  We will see her in follow-up in 6 months time she is to contact us prior to that time should any new problems arise.  Gailen Shelter, MD Advanced Bronchoscopy PCCM [facility]    *This note was dictated using voice recognition software/Dragon.  Despite best efforts to proofread, errors can occur which can change the meaning. Any transcriptional errors that result from this process are unintentional and may not be fully corrected at the time of dictation.

## 2021-03-24 NOTE — Patient Instructions (Signed)
As we reviewed your breathing test show that your asthma is mild which is good news.  There were no significant allergic issues identified on your blood work.  Continue Symbicort as you are doing and the albuterol as needed.  We will see you in follow-up in 6 months time, do call us prior to that with any new issues of concerns with regards to your breathing/lungs.

## 2021-04-03 ENCOUNTER — Other Ambulatory Visit: Payer: Self-pay | Admitting: Family Medicine

## 2021-04-03 DIAGNOSIS — E785 Hyperlipidemia, unspecified: Secondary | ICD-10-CM

## 2021-04-03 DIAGNOSIS — E1169 Type 2 diabetes mellitus with other specified complication: Secondary | ICD-10-CM

## 2021-04-03 NOTE — Telephone Encounter (Signed)
Requested Prescriptions  Pending Prescriptions Disp Refills   rosuvastatin (CRESTOR) 5 MG tablet [Pharmacy Med Name: ROSUVASTATIN CALCIUM 5 MG TAB] 90 tablet 0    Sig: TAKE 1 TABLET BY MOUTH EVERYDAY AT BEDTIME     Cardiovascular:  Antilipid - Statins Failed - 04/03/2021  9:13 AM      Failed - Total Cholesterol in normal range and within 360 days    Cholesterol  Date Value Ref Range Status  11/10/2020 212 (H) <200 mg/dL Final         Failed - LDL in normal range and within 360 days    LDL Cholesterol (Calc)  Date Value Ref Range Status  11/10/2020 144 (H) mg/dL (calc) Final    Comment:    Reference range: <100 . Desirable range <100 mg/dL for primary prevention;   <70 mg/dL for patients with CHD or diabetic patients  with > or = 2 CHD risk factors. Marland Kitchen LDL-C is now calculated using the Martin-Hopkins  calculation, which is a validated novel method providing  better accuracy than the Friedewald equation in the  estimation of LDL-C.  Cresenciano Genre et al. Annamaria Helling. 2353;614(43): 2061-2068  (http://education.[employer].com/faq/FAQ164)          Failed - HDL in normal range and within 360 days    HDL  Date Value Ref Range Status  11/10/2020 38 (L) > OR = 50 mg/dL Final         Failed - Triglycerides in normal range and within 360 days    Triglycerides  Date Value Ref Range Status  11/10/2020 164 (H) <150 mg/dL Final         Passed - Patient is not pregnant      Passed - Valid encounter within last 12 months    Recent Outpatient Visits          1 month ago Epigastric pain   [facility] Rory Percy M, DO   1 month ago Epigastric pain   [facility] Rory Percy M, DO   4 months ago Controlled type 2 diabetes mellitus with other skin complication, without long-term current use of insulin [facility])   [facility] Hodgen, Kristeen Miss, PA-C   8 months ago Moderate asthma with exacerbation, unspecified whether persistent    [facility] Delsa Grana, PA-C   8 months ago Controlled type 2 diabetes mellitus with other skin complication, without long-term current use of insulin [facility])   [facility] Delsa Grana, PA-C      Future Appointments            In 1 month Delsa Grana, PA-C [facility], [facility]           '

## 2021-04-08 ENCOUNTER — Telehealth: Payer: Self-pay | Admitting: Obstetrics & Gynecology

## 2021-04-08 ENCOUNTER — Other Ambulatory Visit: Payer: Self-pay | Admitting: Obstetrics & Gynecology

## 2021-04-08 DIAGNOSIS — R102 Pelvic and perineal pain: Secondary | ICD-10-CM

## 2021-04-08 DIAGNOSIS — N939 Abnormal uterine and vaginal bleeding, unspecified: Secondary | ICD-10-CM

## 2021-04-08 DIAGNOSIS — D219 Benign neoplasm of connective and other soft tissue, unspecified: Secondary | ICD-10-CM

## 2021-04-08 NOTE — Telephone Encounter (Signed)
-----   Message from Gae Dry, MD sent at 03/24/2021  4:52 PM EST ----- Regarding: Surgery Surgery Booking Request Patient Full Name:  Charnay Nazario  MRN: [id number]  DOB: Feb 11, 1976  Surgeon: Hoyt Koch, MD  Requested Surgery Date and Time: January any Primary Diagnosis AND Code:   ICD-10-CM  1. Abnormal uterine bleeding (AUB)  N93.9 norethindrone (AYGESTIN) 5 MG tablet  2. Pelvic pain  R10.2   3. Leiomyoma  D21.9   Secondary Diagnosis and Code:  Surgical Procedure: TLH/BS, Cystoscopy RNFA Requested?: No L&D Notification: No Admission Status: same day surgery Length of Surgery: 75 min Special Case Needs: No H&P: Yes Phone Interview???:  Yes Interpreter: No Medical Clearance:  No Special Scheduling Instructions: No Any known health/anesthesia issues, diabetes, sleep apnea, latex allergy, defibrillator/pacemaker?: No Acuity: P3   (P1 highest, P2 delay may cause harm, P3 low, elective gyn, P4 lowest) Post op follow up visits: 2 weeks later

## 2021-04-08 NOTE — Telephone Encounter (Signed)
Spoke with the patient, surgery is scheduled for 05/04/21, H&P on 1/23 @ 3:15pm, Pre-admit Testing phone interview to be scheduled (patient will watch for MyChart notification), and Post Op on 2/13 @ 3:35pm. Patient confirmed [employer] and secondary Medicaid  [employer]. Patient is aware [employer] will not pay toward this surgery.

## 2021-04-09 ENCOUNTER — Ambulatory Visit: Payer: No Typology Code available for payment source

## 2021-04-13 ENCOUNTER — Other Ambulatory Visit: Payer: Self-pay

## 2021-04-13 ENCOUNTER — Telehealth: Payer: Self-pay

## 2021-04-13 ENCOUNTER — Telehealth (INDEPENDENT_AMBULATORY_CARE_PROVIDER_SITE_OTHER): Payer: No Typology Code available for payment source | Admitting: Nurse Practitioner

## 2021-04-13 ENCOUNTER — Encounter: Payer: Self-pay | Admitting: Nurse Practitioner

## 2021-04-13 DIAGNOSIS — K219 Gastro-esophageal reflux disease without esophagitis: Secondary | ICD-10-CM | POA: Diagnosis not present

## 2021-04-13 DIAGNOSIS — K257 Chronic gastric ulcer without hemorrhage or perforation: Secondary | ICD-10-CM

## 2021-04-13 MED ORDER — SUCRALFATE 1 G PO TABS: 1.0000 g | ORAL_TABLET | Freq: Four times a day (QID) | ORAL | 1 refills | Status: DC

## 2021-04-13 MED ORDER — PANTOPRAZOLE SODIUM 20 MG PO TBEC: 20.0000 mg | DELAYED_RELEASE_TABLET | Freq: Two times a day (BID) | ORAL | 1 refills | Status: DC

## 2021-04-13 NOTE — Progress Notes (Signed)
Name: Donna Reyes   MRN: [id number]    DOB: 1976/03/24   Date:04/13/2021       Progress Note  Subjective  Chief Complaint  Chief Complaint  Patient presents with   Follow-up    ER at [facility]   Abdominal Pain    I connected with  Molly Maduro  on 04/13/21 at 12:45 pm by a video enabled telemedicine application and verified that I am speaking with the correct person using two identifiers.  I discussed the limitations of evaluation and management by telemedicine and the availability of in person appointments. The patient expressed understanding and agreed to proceed with a virtual visit  Staff also discussed with the patient that there may be a patient responsible charge related to this service. Patient Location: home Provider Location: [facility] Additional Individuals present: alone  HPI  ER follow-up: She says she went to the [facility] Emergency Room on 04/09/2021 for epigastric pain for two days. Labs were normal.  EKG was Sinus rhythm at a rate of 79 with no ischemia.  Chest xray was negative.  She was treated with a GI cocktail which improved her symptoms.  She was discharged with a PPI and Carafate. She says she has been taking the prescribed medications and is feeling a little better. She denies any chest pain or shortness of breath.  She denies any nausea or vomiting. She would like a referral to GI.  She used to see a GI specialist in [place] but never established care here with one here.  She says she has a history of a gastric ulcer.  Will send in a referral.  Sent in refill of Carafate and Protonix.   Patient Active Problem List   Diagnosis Date Noted   Abdominal pain 02/08/2021   Leiomyoma 01/07/2021   Abnormal uterine bleeding (AUB) 01/07/2021   Trichomoniasis 01/07/2021   BV (bacterial vaginosis) 01/07/2021   Iron deficiency 12/08/2020   Seasonal allergies 07/22/2020   Class 2 severe obesity with serious comorbidity and body mass index (BMI)  of 37.0 to 37.9 in adult [facility]) 03/10/2020   Hyperlipidemia associated with type 2 diabetes mellitus ([place]) 03/10/2020   Family history of pancreatic cancer 10/10/2019   Right ovarian cyst 10/10/2019   Controlled type 2 diabetes mellitus with skin complication ([place]) 83/15/1761   Menorrhagia with regular cycle 08/14/2018   Lymphadenopathy, axillary 10/27/2017   Essential hypertension 01/06/2016   Mild intermittent asthma with acute exacerbation 01/06/2016   Iron deficiency anemia 12/30/2015    Social History   Tobacco Use   Smoking status: Never   Smokeless tobacco: Never  Substance Use Topics   Alcohol use: No     Current Outpatient Medications:    albuterol (PROVENTIL) (2.5 MG/3ML) 0.083% nebulizer solution, Take 3 mLs (2.5 mg total) by nebulization every 6 (six) hours as needed for wheezing or shortness of breath., Disp: 150 mL, Rfl: 1   albuterol (VENTOLIN HFA) 108 (90 Base) MCG/ACT inhaler, Inhale 2 puffs into the lungs every 6 (six) hours as needed for wheezing or shortness of breath. May also use 2 puffs prior to exercise for EIB, Disp: 8 g, Rfl: 5   losartan-hydrochlorothiazide (HYZAAR) 100-12.5 MG tablet, TAKE 1 TABLET BY MOUTH EVERY DAY (STOP LOSARTAN), Disp: 90 tablet, Rfl: 3   metFORMIN (GLUCOPHAGE-XR) 500 MG 24 hr tablet, Take 1 tablet (500 mg total) by mouth daily with breakfast., Disp: 90 tablet, Rfl: 3   rosuvastatin (CRESTOR) 5 MG tablet, TAKE 1 TABLET  BY MOUTH EVERYDAY AT BEDTIME, Disp: 90 tablet, Rfl: 0   SYMBICORT 160-4.5 MCG/ACT inhaler, Inhale 2 puffs into the lungs in the morning and at bedtime., Disp: 10.2 g, Rfl: 11   norethindrone (AYGESTIN) 5 MG tablet, Take 1 tablet (5 mg total) by mouth daily for 14 days., Disp: 14 tablet, Rfl: 2   OZEMPIC, 1 MG/DOSE, 4 MG/3ML SOPN, INJECT 1 MG INTO THE SKIN ONCE A WEEK. (Patient not taking: Reported on 04/13/2021), Disp: 9 mL, Rfl: 1   pantoprazole (PROTONIX) 20 MG tablet, Take 20 mg by mouth daily., Disp: , Rfl:     sucralfate (CARAFATE) 1 g tablet, Take 1 g by mouth 4 (four) times daily., Disp: , Rfl:   Allergies  Allergen Reactions   Capsicum Shortness Of Breath    Jalapeno peppers   Basil Oil Swelling    Tongue swelling   Lisinopril     fatigue    I personally reviewed active problem list, medication list, allergies, notes from last encounter with the patient/caregiver today.  ROS  Constitutional: Negative for fever or weight change.  Respiratory: Negative for cough and shortness of breath.   Cardiovascular: Negative for chest pain or palpitations.  Gastrointestinal: Positive for abdominal pain, no bowel changes.  Musculoskeletal: Negative for gait problem or joint swelling.  Skin: Negative for rash.  Neurological: Negative for dizziness or headache.  No other specific complaints in a complete review of systems (except as listed in HPI above).   Objective  Virtual encounter, vitals not obtained.  There is no height or weight on file to calculate BMI.  Nursing Note and Vital Signs reviewed.  Physical Exam  Awake, alert and oriented, speaking in complete sentences, in no apparent distress  No results found for this or any previous visit (from the past 72 hour(s)).  Assessment & Plan  1. Gastroesophageal reflux disease without esophagitis - pantoprazole (PROTONIX) 20 MG tablet; Take 1 tablet (20 mg total) by mouth 2 (two) times daily.  Dispense: 90 tablet; Refill: 1 - sucralfate (CARAFATE) 1 g tablet; Take 1 tablet (1 g total) by mouth 4 (four) times daily.  Dispense: 120 tablet; Refill: 1 - Ambulatory referral to Gastroenterology  2. Chronic gastric ulcer without hemorrhage and without perforation - pantoprazole (PROTONIX) 20 MG tablet; Take 1 tablet (20 mg total) by mouth 2 (two) times daily.  Dispense: 90 tablet; Refill: 1 - sucralfate (CARAFATE) 1 g tablet; Take 1 tablet (1 g total) by mouth 4 (four) times daily.  Dispense: 120 tablet; Refill: 1 - Ambulatory referral to  Gastroenterology   -Red flags and when to present for emergency care or RTC including fever >101.61F, chest pain, shortness of breath, new/worsening/un-resolving symptoms, reviewed with patient at time of visit. Follow up and care instructions discussed and provided in AVS. - I discussed the assessment and treatment plan with the patient. The patient was provided an opportunity to ask questions and all were answered. The patient agreed with the plan and demonstrated an understanding of the instructions.  I provided 15 minutes of non-face-to-face time during this encounter.  Bo Merino, FNP

## 2021-04-13 NOTE — Telephone Encounter (Signed)
FMLA/DISABILITY forms for [employer] (3) filled out.  Will get signature and process.

## 2021-04-21 NOTE — Telephone Encounter (Signed)
Patient is calling needing to speak with Donna Reyes.patient is aware [employer] is out office the office until Friday.

## 2021-04-26 ENCOUNTER — Encounter: Payer: Self-pay | Admitting: Obstetrics & Gynecology

## 2021-04-26 ENCOUNTER — Ambulatory Visit (INDEPENDENT_AMBULATORY_CARE_PROVIDER_SITE_OTHER): Payer: No Typology Code available for payment source | Admitting: Obstetrics & Gynecology

## 2021-04-26 ENCOUNTER — Encounter: Payer: Self-pay | Admitting: Oncology

## 2021-04-26 ENCOUNTER — Other Ambulatory Visit: Payer: Self-pay

## 2021-04-26 VITALS — BP 122/80 | Ht 62.0 in | Wt 200.0 lb

## 2021-04-26 DIAGNOSIS — N939 Abnormal uterine and vaginal bleeding, unspecified: Secondary | ICD-10-CM

## 2021-04-26 DIAGNOSIS — D219 Benign neoplasm of connective and other soft tissue, unspecified: Secondary | ICD-10-CM

## 2021-04-26 DIAGNOSIS — R102 Pelvic and perineal pain: Secondary | ICD-10-CM

## 2021-04-26 NOTE — Progress Notes (Signed)
PRE-OPERATIVE HISTORY AND PHYSICAL EXAM  HPI:  Donna Reyes is a 46 y.o. [id number] No LMP recorded. (Menstrual status: Irregular Periods).; she is being admitted for surgery related to abnormal uterine bleeding, fibroids, and pelvic pain.  She complains of menometrorrhagia that  began several months ago (if not years) and its severity is described as severe.  She has periods now that are lasting weeks, associated w clots, leads to anemia and need for iron infusions w hematology, refractory to Depo, Nexplanon, and Aygestin in past, and they are associated with moderate menstrual cramping.  She has used the following for attempts at control: tampon and pad.   Previous evaluation: ultrasound showing 3 cm fibroid.  Measurements: 12.6 x 6.2 x 6.7 cm = volume: 276 mL. Anteverted and slightly retroflexed. Heterogeneous myometrium. Questionable leiomyoma anteriorly at upper uterus, transmural, 2.9 x 2.3 x 2.6 cm. No definite additional mass is seen.   EMB and PAP normal  PMHx: Past Medical History:  Diagnosis Date   Allergy    Anemia    Anxiety 03/05/2019   Asthma    BRCA negative 10/2019   MyRisk neg; IBIS=7.8%   Diabetes mellitus without complication ([place])    type 2   Family history of adverse reaction to anesthesia    Father - slow to wake   Family history of pancreatic cancer    Hypertension    Sleep apnea    Wears contact lenses    Past Surgical History:  Procedure Laterality Date   AXILLARY LYMPH NODE BIOPSY Right 05/25/2017   LYMPHADENITIS. NEGATIVE FOR MALIGNANCY.    BREAST BIOPSY Right 05/25/2017   neg/NEGATIVE FOR CARCINOMA. INFLAMMATION AND SQUAMOUS METAPLASIA OF A FOCAL DUCT    CESAREAN SECTION     times 3   COLONOSCOPY WITH PROPOFOL N/A 06/23/2020   Procedure: COLONOSCOPY WITH PROPOFOL;  Surgeon: Virgel Manifold, MD;  Location: [facility];  Service: Endoscopy;  Laterality: N/A;  Diabetic  sleep apnea priority 4   TUBAL LIGATION     Family  History  Problem Relation Age of Onset   Diabetes Mother    Heart failure Mother    Kidney disease Mother    Diabetes Maternal Grandmother    Breast cancer Maternal Grandmother 80   Hypertension Father    Hyperlipidemia Father    Atrial fibrillation Sister    ADD / ADHD Son    Heart attack Maternal Grandfather    Diabetes Paternal Grandmother    Hypertension Paternal Grandmother    Prostate cancer Paternal Grandfather    Colon cancer Paternal Grandfather 56   Polycystic ovary syndrome Sister    Polycystic ovary syndrome Sister    Pancreatic cancer Paternal Aunt 10   Ovarian cancer Neg Hx    Social History   Tobacco Use   Smoking status: Never   Smokeless tobacco: Never  Vaping Use   Vaping Use: Never used  Substance Use Topics   Alcohol use: No   Drug use: No    Current Outpatient Medications:    albuterol (PROVENTIL) (2.5 MG/3ML) 0.083% nebulizer solution, Take 3 mLs (2.5 mg total) by nebulization every 6 (six) hours as needed for wheezing or shortness of breath. (Patient taking differently: Take 2.5 mg by nebulization every 6 (six) hours as needed (Asthma).), Disp: 150 mL, Rfl: 1   albuterol (VENTOLIN HFA) 108 (90 Base) MCG/ACT inhaler, Inhale 2 puffs into the lungs every 6 (six) hours as needed for wheezing or shortness of breath.  May also use 2 puffs prior to exercise for EIB (Patient taking differently: Inhale 2 puffs into the lungs every 6 (six) hours as needed (Asthma).), Disp: 8 g, Rfl: 5   Cholecalciferol (VITAMIN D3) 125 MCG (5000 UT) CAPS, Take 5,000 Units by mouth daily., Disp: , Rfl:    losartan-hydrochlorothiazide (HYZAAR) 100-12.5 MG tablet, TAKE 1 TABLET BY MOUTH EVERY DAY (STOP LOSARTAN), Disp: 90 tablet, Rfl: 3   metFORMIN (GLUCOPHAGE-XR) 500 MG 24 hr tablet, Take 1 tablet (500 mg total) by mouth daily with breakfast., Disp: 90 tablet, Rfl: 3   pantoprazole (PROTONIX) 20 MG tablet, Take 1 tablet (20 mg total) by mouth 2 (two) times daily. (Patient taking  differently: Take 20 mg by mouth daily.), Disp: 90 tablet, Rfl: 1   rosuvastatin (CRESTOR) 5 MG tablet, TAKE 1 TABLET BY MOUTH EVERYDAY AT BEDTIME, Disp: 90 tablet, Rfl: 0   Semaglutide, 1 MG/DOSE, (OZEMPIC, 1 MG/DOSE,) 4 MG/3ML SOPN, Inject into the skin once a week. 1 ml, Disp: , Rfl:    sucralfate (CARAFATE) 1 g tablet, Take 1 tablet (1 g total) by mouth 4 (four) times daily. (Patient not taking: Reported on 04/20/2021), Disp: 120 tablet, Rfl: 1   SYMBICORT 160-4.5 MCG/ACT inhaler, Inhale 2 puffs into the lungs in the morning and at bedtime. (Patient taking differently: Inhale 2 puffs into the lungs daily.), Disp: 10.2 g, Rfl: 11 Allergies: Capsicum, Basil oil, and Lisinopril  Review of Systems  Constitutional:  Negative for chills, fever and malaise/fatigue.  HENT:  Negative for congestion, sinus pain and sore throat.   Eyes:  Negative for blurred vision and pain.  Respiratory:  Negative for cough and wheezing.   Cardiovascular:  Negative for chest pain and leg swelling.  Gastrointestinal:  Positive for abdominal pain. Negative for constipation, diarrhea, heartburn, nausea and vomiting.  Genitourinary:  Negative for dysuria, frequency, hematuria and urgency.  Musculoskeletal:  Negative for back pain, joint pain, myalgias and neck pain.  Skin:  Negative for itching and rash.  Neurological:  Negative for dizziness, tremors and weakness.  Endo/Heme/Allergies:  Does not bruise/bleed easily.  Psychiatric/Behavioral:  Negative for depression. The patient is not nervous/anxious and does not have insomnia.    Objective: There were no vitals taken for this visit. There were no vitals filed for this visit. Physical Exam Constitutional:      General: She is not in acute distress.    Appearance: She is well-developed.  HENT:     Head: Normocephalic and atraumatic. No laceration.     Right Ear: Hearing normal.     Left Ear: Hearing normal.     Mouth/Throat:     Pharynx: Uvula midline.  Eyes:      Pupils: Pupils are equal, round, and reactive to light.  Neck:     Thyroid: No thyromegaly.  Cardiovascular:     Rate and Rhythm: Normal rate and regular rhythm.     Heart sounds: No murmur heard.   No friction rub. No gallop.  Pulmonary:     Effort: Pulmonary effort is normal. No respiratory distress.     Breath sounds: Normal breath sounds. No wheezing.  Abdominal:     General: Bowel sounds are normal. There is no distension.     Palpations: Abdomen is soft.     Tenderness: There is no abdominal tenderness. There is no rebound.  Musculoskeletal:        General: Normal range of motion.     Cervical back: Normal range of motion and neck  supple.  Neurological:     Mental Status: She is alert and oriented to person, place, and time.     Cranial Nerves: No cranial nerve deficit.  Skin:    General: Skin is warm and dry.  Psychiatric:        Judgment: Judgment normal.  Vitals reviewed.    Assessment: 1. Abnormal uterine bleeding (AUB)   2. Leiomyoma   3. Pelvic pain   Plan TLH BS Cysto Ovarian preservation planned and discussed Risks of adhesions (prior CS) discussed as well  I have had a careful discussion with this patient about all the options available and the risk/benefits of each. I have fully informed this patient that surgery may subject her to a variety of discomforts and risks: She understands that most patients have surgery with little difficulty, but problems can happen ranging from minor to fatal. These include nausea, vomiting, pain, bleeding, infection, poor healing, hernia, or formation of adhesions. Unexpected reactions may occur from any drug or anesthetic given. Unintended injury may occur to other pelvic or abdominal structures such as Fallopian tubes, ovaries, bladder, ureter (tube from kidney to bladder), or bowel. Nerves going from the pelvis to the legs may be injured. Any such injury may require immediate or later additional surgery to correct the problem.  Excessive blood loss requiring transfusion is very unlikely but possible. Dangerous blood clots may form in the legs or lungs. Physical and sexual activity will be restricted in varying degrees for an indeterminate period of time but most often 2-6 weeks.  Finally, she understands that it is impossible to list every possible undesirable effect and that the condition for which surgery is done is not always cured or significantly improved, and in rare cases may be even worse.Ample time was given to answer all questions.  Barnett Applebaum, MD, [facility], [facility] 04/26/2021  10:07 AM

## 2021-04-26 NOTE — H&P (View-Only) (Signed)
PRE-OPERATIVE HISTORY AND PHYSICAL EXAM  HPI:  Donna Reyes is a 46 y.o. [id number] No LMP recorded. (Menstrual status: Irregular Periods).; she is being admitted for surgery related to abnormal uterine bleeding, fibroids, and pelvic pain.  She complains of menometrorrhagia that  began several months ago (if not years) and its severity is described as severe.  She has periods now that are lasting weeks, associated w clots, leads to anemia and need for iron infusions w hematology, refractory to Depo, Nexplanon, and Aygestin in past, and they are associated with moderate menstrual cramping.  She has used the following for attempts at control: tampon and pad.   Previous evaluation: ultrasound showing 3 cm fibroid.  Measurements: 12.6 x 6.2 x 6.7 cm = volume: 276 mL. Anteverted and slightly retroflexed. Heterogeneous myometrium. Questionable leiomyoma anteriorly at upper uterus, transmural, 2.9 x 2.3 x 2.6 cm. No definite additional mass is seen.   EMB and PAP normal  PMHx: Past Medical History:  Diagnosis Date   Allergy    Anemia    Anxiety 03/05/2019   Asthma    BRCA negative 10/2019   MyRisk neg; IBIS=7.8%   Diabetes mellitus without complication ([place])    type 2   Family history of adverse reaction to anesthesia    Father - slow to wake   Family history of pancreatic cancer    Hypertension    Sleep apnea    Wears contact lenses    Past Surgical History:  Procedure Laterality Date   AXILLARY LYMPH NODE BIOPSY Right 05/25/2017   LYMPHADENITIS. NEGATIVE FOR MALIGNANCY.    BREAST BIOPSY Right 05/25/2017   neg/NEGATIVE FOR CARCINOMA. INFLAMMATION AND SQUAMOUS METAPLASIA OF A FOCAL DUCT    CESAREAN SECTION     times 3   COLONOSCOPY WITH PROPOFOL N/A 06/23/2020   Procedure: COLONOSCOPY WITH PROPOFOL;  Surgeon: Virgel Manifold, MD;  Location: [facility];  Service: Endoscopy;  Laterality: N/A;  Diabetic  sleep apnea priority 4   TUBAL LIGATION     Family  History  Problem Relation Age of Onset   Diabetes Mother    Heart failure Mother    Kidney disease Mother    Diabetes Maternal Grandmother    Breast cancer Maternal Grandmother 24   Hypertension Father    Hyperlipidemia Father    Atrial fibrillation Sister    ADD / ADHD Son    Heart attack Maternal Grandfather    Diabetes Paternal Grandmother    Hypertension Paternal Grandmother    Prostate cancer Paternal Grandfather    Colon cancer Paternal Grandfather 29   Polycystic ovary syndrome Sister    Polycystic ovary syndrome Sister    Pancreatic cancer Paternal Aunt 39   Ovarian cancer Neg Hx    Social History   Tobacco Use   Smoking status: Never   Smokeless tobacco: Never  Vaping Use   Vaping Use: Never used  Substance Use Topics   Alcohol use: No   Drug use: No    Current Outpatient Medications:    albuterol (PROVENTIL) (2.5 MG/3ML) 0.083% nebulizer solution, Take 3 mLs (2.5 mg total) by nebulization every 6 (six) hours as needed for wheezing or shortness of breath. (Patient taking differently: Take 2.5 mg by nebulization every 6 (six) hours as needed (Asthma).), Disp: 150 mL, Rfl: 1   albuterol (VENTOLIN HFA) 108 (90 Base) MCG/ACT inhaler, Inhale 2 puffs into the lungs every 6 (six) hours as needed for wheezing or shortness of breath.  May also use 2 puffs prior to exercise for EIB (Patient taking differently: Inhale 2 puffs into the lungs every 6 (six) hours as needed (Asthma).), Disp: 8 g, Rfl: 5   Cholecalciferol (VITAMIN D3) 125 MCG (5000 UT) CAPS, Take 5,000 Units by mouth daily., Disp: , Rfl:    losartan-hydrochlorothiazide (HYZAAR) 100-12.5 MG tablet, TAKE 1 TABLET BY MOUTH EVERY DAY (STOP LOSARTAN), Disp: 90 tablet, Rfl: 3   metFORMIN (GLUCOPHAGE-XR) 500 MG 24 hr tablet, Take 1 tablet (500 mg total) by mouth daily with breakfast., Disp: 90 tablet, Rfl: 3   pantoprazole (PROTONIX) 20 MG tablet, Take 1 tablet (20 mg total) by mouth 2 (two) times daily. (Patient taking  differently: Take 20 mg by mouth daily.), Disp: 90 tablet, Rfl: 1   rosuvastatin (CRESTOR) 5 MG tablet, TAKE 1 TABLET BY MOUTH EVERYDAY AT BEDTIME, Disp: 90 tablet, Rfl: 0   Semaglutide, 1 MG/DOSE, (OZEMPIC, 1 MG/DOSE,) 4 MG/3ML SOPN, Inject into the skin once a week. 1 ml, Disp: , Rfl:    sucralfate (CARAFATE) 1 g tablet, Take 1 tablet (1 g total) by mouth 4 (four) times daily. (Patient not taking: Reported on 04/20/2021), Disp: 120 tablet, Rfl: 1   SYMBICORT 160-4.5 MCG/ACT inhaler, Inhale 2 puffs into the lungs in the morning and at bedtime. (Patient taking differently: Inhale 2 puffs into the lungs daily.), Disp: 10.2 g, Rfl: 11 Allergies: Capsicum, Basil oil, and Lisinopril  Review of Systems  Constitutional:  Negative for chills, fever and malaise/fatigue.  HENT:  Negative for congestion, sinus pain and sore throat.   Eyes:  Negative for blurred vision and pain.  Respiratory:  Negative for cough and wheezing.   Cardiovascular:  Negative for chest pain and leg swelling.  Gastrointestinal:  Positive for abdominal pain. Negative for constipation, diarrhea, heartburn, nausea and vomiting.  Genitourinary:  Negative for dysuria, frequency, hematuria and urgency.  Musculoskeletal:  Negative for back pain, joint pain, myalgias and neck pain.  Skin:  Negative for itching and rash.  Neurological:  Negative for dizziness, tremors and weakness.  Endo/Heme/Allergies:  Does not bruise/bleed easily.  Psychiatric/Behavioral:  Negative for depression. The patient is not nervous/anxious and does not have insomnia.    Objective: There were no vitals taken for this visit. There were no vitals filed for this visit. Physical Exam Constitutional:      General: She is not in acute distress.    Appearance: She is well-developed.  HENT:     Head: Normocephalic and atraumatic. No laceration.     Right Ear: Hearing normal.     Left Ear: Hearing normal.     Mouth/Throat:     Pharynx: Uvula midline.  Eyes:      Pupils: Pupils are equal, round, and reactive to light.  Neck:     Thyroid: No thyromegaly.  Cardiovascular:     Rate and Rhythm: Normal rate and regular rhythm.     Heart sounds: No murmur heard.   No friction rub. No gallop.  Pulmonary:     Effort: Pulmonary effort is normal. No respiratory distress.     Breath sounds: Normal breath sounds. No wheezing.  Abdominal:     General: Bowel sounds are normal. There is no distension.     Palpations: Abdomen is soft.     Tenderness: There is no abdominal tenderness. There is no rebound.  Musculoskeletal:        General: Normal range of motion.     Cervical back: Normal range of motion and neck  supple.  Neurological:     Mental Status: She is alert and oriented to person, place, and time.     Cranial Nerves: No cranial nerve deficit.  Skin:    General: Skin is warm and dry.  Psychiatric:        Judgment: Judgment normal.  Vitals reviewed.    Assessment: 1. Abnormal uterine bleeding (AUB)   2. Leiomyoma   3. Pelvic pain   Plan TLH BS Cysto Ovarian preservation planned and discussed Risks of adhesions (prior CS) discussed as well  I have had a careful discussion with this patient about all the options available and the risk/benefits of each. I have fully informed this patient that surgery may subject her to a variety of discomforts and risks: She understands that most patients have surgery with little difficulty, but problems can happen ranging from minor to fatal. These include nausea, vomiting, pain, bleeding, infection, poor healing, hernia, or formation of adhesions. Unexpected reactions may occur from any drug or anesthetic given. Unintended injury may occur to other pelvic or abdominal structures such as Fallopian tubes, ovaries, bladder, ureter (tube from kidney to bladder), or bowel. Nerves going from the pelvis to the legs may be injured. Any such injury may require immediate or later additional surgery to correct the problem.  Excessive blood loss requiring transfusion is very unlikely but possible. Dangerous blood clots may form in the legs or lungs. Physical and sexual activity will be restricted in varying degrees for an indeterminate period of time but most often 2-6 weeks.  Finally, she understands that it is impossible to list every possible undesirable effect and that the condition for which surgery is done is not always cured or significantly improved, and in rare cases may be even worse.Ample time was given to answer all questions.  Barnett Applebaum, MD, [facility], [facility] 04/26/2021  10:07 AM

## 2021-04-26 NOTE — Patient Instructions (Signed)
Total Laparoscopic Hysterectomy, Care After The following information offers guidance on how to care for yourself after your procedure. Your health care provider may also give you more specific instructions. If you have problems or questions, contact your health care provider. What can I expect after the procedure? After the procedure, it is common to have: Pain, bruising, and numbness around your incisions. Tiredness (fatigue). Poor appetite. Less interest in sex. Vaginal discharge or bleeding. You will need to use a sanitary pad after this procedure. Feelings of sadness or other emotions. If your ovaries were also removed, it is also common to have symptoms of menopause, such as hot flashes, night sweats, and lack of sleep (insomnia). Follow these instructions at home: Medicines Take over-the-counter and prescription medicines only as told by your health care provider. Ask your health care provider if the medicine prescribed to you: Requires you to avoid driving or using machinery. Can cause constipation. You may need to take these actions to prevent or treat constipation: Drink enough fluid to keep your urine pale yellow. Take over-the-counter or prescription medicines. Eat foods that are high in fiber, such as beans, whole grains, and fresh fruits and vegetables. Limit foods that are high in fat and processed sugars, such as fried or sweet foods. Incision care  Follow instructions from your health care provider about how to take care of your incisions. Make sure you: Wash your hands with soap and water for at least 20 seconds before and after you change your bandage (dressing). If soap and water are not available, use hand sanitizer. Change your dressing as told by your health care provider. Leave stitches (sutures), skin glue, or adhesive strips in place. These skin closures may need to stay in place for 2 weeks or longer. If adhesive strip edges start to loosen and curl up, you may  trim the loose edges. Do not remove adhesive strips completely unless your health care provider tells you to do that. Check your incision areas every day for signs of infection. Check for: More redness, swelling, or pain. Fluid or blood. Warmth. Pus or a bad smell. Activity  Rest as told by your health care provider. Avoid sitting for a long time without moving. Get up to take short walks every 1-2 hours. This is important to improve blood flow and breathing. Ask for help if you feel weak or unsteady. Return to your normal activities as told by your health care provider. Ask your health care provider what activities are safe for you. Do not lift anything that is heavier than 10 lb (4.5 kg), or the limit that you are told, for one month after surgery or until your health care provider says that it is safe. If you were given a sedative during the procedure, it can affect you for several hours. Do not drive or operate machinery until your health care provider says that it is safe. Lifestyle Do not use any products that contain nicotine or tobacco. These products include cigarettes, chewing tobacco, and vaping devices, such as e-cigarettes. These can delay healing after surgery. If you need help quitting, ask your health care provider. Do not drink alcohol until your health care provider approves. General instructions  Do not douche, use tampons, or have sex for at least 6 weeks, or as told by your health care provider. If you struggle with physical or emotional changes after your procedure, speak with your health care provider or a therapist. Do not take baths, swim, or use a hot tub  until your health care provider approves. You may only be allowed to take showers for 2-3 weeks. Keep your dressing dry until your health care provider says it can be removed. Try to have someone at home with you for the first 1-2 weeks to help with your daily chores. Wear compression stockings as told by your health  care provider. These stockings help to prevent blood clots and reduce swelling in your legs. Keep all follow-up visits. This is important. Contact a health care provider if: You have any of these signs of infection: Chills or a fever. More redness, swelling, or pain around an incision. Fluid or blood coming from an incision. Warmth coming from an incision. Pus or a bad smell coming from an incision. An incision opens. You feel dizzy or light-headed. You have pain or bleeding when you urinate, or you are unable to urinate. You have abnormal vaginal discharge. You have pain that does not get better with medicine. Get help right away if: You have a fever and your symptoms suddenly get worse. You have severe abdominal pain. You have chest pain or shortness of breath. You faint. You have pain, swelling, or redness in your leg. You have heavy vaginal bleeding with blood clots, soaking through a sanitary pad in less than 1 hour. These symptoms may represent a serious problem that is an emergency. Do not wait to see if the symptoms will go away. Get medical help right away. Call your local emergency services (911 in the [place] Do not drive yourself to the hospital. Summary After the procedure, it is common to have pain and bruising around your incisions. Do not take baths, swim, or use a hot tub until your health care provider approves. Do not lift anything that is heavier than 10 lb (4.5 kg), or the limit that you are told, for one month after surgery or until your health care provider says that it is safe. Tell your health care provider if you have any signs or symptoms of infection after the procedure. Get help right away if you have severe abdominal pain, chest pain, shortness of breath, or heavy bleeding from your vagina. This information is not intended to replace advice given to you by your health care provider. Make sure you discuss any questions you have with your health care  provider. Document Revised: 11/22/2019 Document Reviewed: 11/22/2019 Elsevier Patient Education  [employer].

## 2021-04-27 ENCOUNTER — Encounter
Admission: RE | Admit: 2021-04-27 | Discharge: 2021-04-27 | Disposition: A | Discharge status: Home / Self Care | Payer: No Typology Code available for payment source | Source: Ambulatory Visit | Attending: Obstetrics & Gynecology | Admitting: Obstetrics & Gynecology

## 2021-04-27 DIAGNOSIS — N939 Abnormal uterine and vaginal bleeding, unspecified: Secondary | ICD-10-CM | POA: Insufficient documentation

## 2021-04-27 DIAGNOSIS — D219 Benign neoplasm of connective and other soft tissue, unspecified: Secondary | ICD-10-CM | POA: Insufficient documentation

## 2021-04-27 DIAGNOSIS — R102 Pelvic and perineal pain: Secondary | ICD-10-CM | POA: Insufficient documentation

## 2021-04-27 DIAGNOSIS — Z01812 Encounter for preprocedural laboratory examination: Secondary | ICD-10-CM | POA: Insufficient documentation

## 2021-04-27 HISTORY — DX: Gastro-esophageal reflux disease without esophagitis: K21.9

## 2021-04-27 NOTE — Patient Instructions (Addendum)
Your procedure is scheduled on: Tuesday, January 31 Report to the Registration Desk on the 1st floor of the [facility]. To find out your arrival time, please call [phone] between 1PM - 3PM on: Monday, January 30  REMEMBER: Instructions that are not followed completely may result in serious medical risk, up to and including death; or upon the discretion of your surgeon and anesthesiologist your surgery may need to be rescheduled.  Do not eat or drink after midnight the night before surgery.  No gum chewing, lozengers or hard candies.  TAKE THESE MEDICATIONS THE MORNING OF SURGERY WITH A SIP OF WATER:  Albuterol nebulizer Pantoprazole (Protonix) - (take one the night before and one on the morning of surgery - helps to prevent nausea after surgery.) Symbicort inhaler  Use inhalers on the day of surgery and bring to the hospital.  Stop metformin 2 days prior to surgery. Last day to take metformin is Saturday, January 28. Resume AFTER surgery.  One week prior to surgery: starting January 24 Stop Anti-inflammatories (NSAIDS) such as Advil, Aleve, Ibuprofen, Motrin, Naproxen, Naprosyn and Aspirin based products such as Excedrin, Goodys Powder, BC Powder. Stop ANY OVER THE COUNTER supplements until after surgery. You may however, continue to take Tylenol if needed for pain up until the day of surgery.  No Alcohol for 24 hours before or after surgery.  No Smoking including e-cigarettes for 24 hours prior to surgery.  No chewable tobacco products for at least 6 hours prior to surgery.  No nicotine patches on the day of surgery.  Do not use any "recreational" drugs for at least a week prior to your surgery.  Please be advised that the combination of cocaine and anesthesia may have negative outcomes, up to and including death. If you test positive for cocaine, your surgery will be cancelled.  On the morning of surgery brush your teeth with toothpaste and water, you may rinse your  mouth with mouthwash if you wish. Do not swallow any toothpaste or mouthwash.  Use CHG Soap as directed on instruction sheet.  Do not wear jewelry, make-up, hairpins, clips or nail polish.  Do not wear lotions, powders, or perfumes.   Do not shave body from the neck down 48 hours prior to surgery just in case you cut yourself which could leave a site for infection.  Also, freshly shaved skin may become irritated if using the CHG soap.  Contact lenses, hearing aids and dentures may not be worn into surgery.  Do not bring valuables to the hospital. [facility] is not responsible for any missing/lost belongings or valuables.   Bring your C-PAP to the hospital with you in case you may have to spend the night.   Notify your doctor if there is any change in your medical condition (cold, fever, infection).  Wear comfortable clothing (specific to your surgery type) to the hospital.  After surgery, you can help prevent lung complications by doing breathing exercises.  Take deep breaths and cough every 1-2 hours. Your doctor may order a device called an Incentive Spirometer to help you take deep breaths. When coughing or sneezing, hold a pillow firmly against your incision with both hands. This is called splinting. Doing this helps protect your incision. It also decreases belly discomfort.  If you are being discharged the day of surgery, you will not be allowed to drive home. You will need a responsible adult (18 years or older) to drive you home and stay with you that night.  If you are taking public transportation, you will need to have a responsible adult (18 years or older) with you. Please confirm with your physician that it is acceptable to use public transportation.   Please call the [place] [facility] at [phone] if you have any questions about these instructions.  Surgery Visitation Policy:  Patients undergoing a surgery or procedure may have one family member or  support person with them as long as that person is not COVID-19 positive or experiencing its symptoms.  That person may remain in the waiting area during the procedure and may rotate out with other people.

## 2021-04-28 ENCOUNTER — Encounter: Payer: Self-pay | Admitting: Urgent Care

## 2021-04-28 ENCOUNTER — Other Ambulatory Visit: Payer: Self-pay

## 2021-04-28 ENCOUNTER — Encounter
Admission: RE | Admit: 2021-04-28 | Discharge: 2021-04-28 | Disposition: A | Discharge status: Home / Self Care | Payer: No Typology Code available for payment source | Source: Ambulatory Visit | Attending: Obstetrics & Gynecology | Admitting: Obstetrics & Gynecology

## 2021-04-28 DIAGNOSIS — N939 Abnormal uterine and vaginal bleeding, unspecified: Secondary | ICD-10-CM

## 2021-04-28 DIAGNOSIS — D219 Benign neoplasm of connective and other soft tissue, unspecified: Secondary | ICD-10-CM

## 2021-04-28 DIAGNOSIS — Z01812 Encounter for preprocedural laboratory examination: Secondary | ICD-10-CM | POA: Diagnosis present

## 2021-04-28 DIAGNOSIS — R102 Pelvic and perineal pain: Secondary | ICD-10-CM

## 2021-04-28 LAB — TYPE AND SCREEN
ABO/RH(D): B POS
Antibody Screen: NEGATIVE

## 2021-04-30 ENCOUNTER — Encounter: Payer: Self-pay | Admitting: Obstetrics and Gynecology

## 2021-04-30 ENCOUNTER — Other Ambulatory Visit: Payer: Self-pay

## 2021-04-30 ENCOUNTER — Ambulatory Visit
Admission: EM | Admit: 2021-04-30 | Discharge: 2021-04-30 | Disposition: A | Discharge status: Home / Self Care | Payer: No Typology Code available for payment source | Attending: Medical Oncology | Admitting: Medical Oncology

## 2021-04-30 DIAGNOSIS — S41112A Laceration without foreign body of left upper arm, initial encounter: Secondary | ICD-10-CM

## 2021-04-30 DIAGNOSIS — S60419A Abrasion of unspecified finger, initial encounter: Secondary | ICD-10-CM

## 2021-04-30 MED ORDER — DOXYCYCLINE HYCLATE 100 MG PO CAPS: 100.0000 mg | ORAL_CAPSULE | Freq: Two times a day (BID) | ORAL | 0 refills | Status: DC
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
## 2021-04-30 NOTE — Discharge Instructions (Signed)
Return in 7-10 days for suture removal.
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
## 2021-05-03 NOTE — Telephone Encounter (Signed)
Called pt to advise that [facility] said there is no need to postpone the surgery unless she felt like she needed to do so. The stiches/antibiotic will not affect surgery.  She will report to hospital in the morning.

## 2021-05-04 ENCOUNTER — Ambulatory Visit: Payer: No Typology Code available for payment source | Admitting: Certified Registered"

## 2021-05-04 ENCOUNTER — Other Ambulatory Visit: Payer: Self-pay

## 2021-05-04 ENCOUNTER — Ambulatory Visit (HOSPITAL_BASED_OUTPATIENT_CLINIC_OR_DEPARTMENT_OTHER)
Admission: RE | Admit: 2021-05-04 | Discharge: 2021-05-04 | Disposition: A | Discharge status: Home / Self Care | Payer: No Typology Code available for payment source | Source: Home / Self Care | Attending: Obstetrics & Gynecology | Admitting: Obstetrics & Gynecology

## 2021-05-04 ENCOUNTER — Encounter
Admission: RE | Disposition: A | Discharge status: Home / Self Care | Payer: Self-pay | Source: Home / Self Care | Attending: Obstetrics & Gynecology

## 2021-05-04 ENCOUNTER — Encounter: Payer: Self-pay | Admitting: Obstetrics & Gynecology

## 2021-05-04 DIAGNOSIS — N8003 Adenomyosis of the uterus: Secondary | ICD-10-CM | POA: Diagnosis not present

## 2021-05-04 DIAGNOSIS — I10 Essential (primary) hypertension: Secondary | ICD-10-CM | POA: Insufficient documentation

## 2021-05-04 DIAGNOSIS — D509 Iron deficiency anemia, unspecified: Secondary | ICD-10-CM | POA: Diagnosis present

## 2021-05-04 DIAGNOSIS — F419 Anxiety disorder, unspecified: Secondary | ICD-10-CM | POA: Insufficient documentation

## 2021-05-04 DIAGNOSIS — N920 Excessive and frequent menstruation with regular cycle: Secondary | ICD-10-CM | POA: Diagnosis not present

## 2021-05-04 DIAGNOSIS — Z7984 Long term (current) use of oral hypoglycemic drugs: Secondary | ICD-10-CM | POA: Insufficient documentation

## 2021-05-04 DIAGNOSIS — D500 Iron deficiency anemia secondary to blood loss (chronic): Secondary | ICD-10-CM | POA: Diagnosis not present

## 2021-05-04 DIAGNOSIS — K219 Gastro-esophageal reflux disease without esophagitis: Secondary | ICD-10-CM | POA: Insufficient documentation

## 2021-05-04 DIAGNOSIS — D219 Benign neoplasm of connective and other soft tissue, unspecified: Secondary | ICD-10-CM | POA: Diagnosis present

## 2021-05-04 DIAGNOSIS — N939 Abnormal uterine and vaginal bleeding, unspecified: Secondary | ICD-10-CM | POA: Diagnosis not present

## 2021-05-04 DIAGNOSIS — Z6836 Body mass index (BMI) 36.0-36.9, adult: Secondary | ICD-10-CM | POA: Insufficient documentation

## 2021-05-04 DIAGNOSIS — G4730 Sleep apnea, unspecified: Secondary | ICD-10-CM | POA: Insufficient documentation

## 2021-05-04 DIAGNOSIS — Z79899 Other long term (current) drug therapy: Secondary | ICD-10-CM | POA: Insufficient documentation

## 2021-05-04 DIAGNOSIS — Z7951 Long term (current) use of inhaled steroids: Secondary | ICD-10-CM | POA: Insufficient documentation

## 2021-05-04 DIAGNOSIS — E119 Type 2 diabetes mellitus without complications: Secondary | ICD-10-CM | POA: Insufficient documentation

## 2021-05-04 DIAGNOSIS — E669 Obesity, unspecified: Secondary | ICD-10-CM | POA: Insufficient documentation

## 2021-05-04 DIAGNOSIS — R102 Pelvic and perineal pain: Secondary | ICD-10-CM | POA: Insufficient documentation

## 2021-05-04 DIAGNOSIS — J45909 Unspecified asthma, uncomplicated: Secondary | ICD-10-CM | POA: Insufficient documentation

## 2021-05-04 HISTORY — PX: CYSTOSCOPY: SHX5120

## 2021-05-04 HISTORY — PX: TOTAL LAPAROSCOPIC HYSTERECTOMY WITH SALPINGECTOMY: SHX6742

## 2021-05-04 LAB — GLUCOSE, CAPILLARY
Glucose-Capillary: 105 mg/dL — ABNORMAL HIGH (ref 70–99)
Glucose-Capillary: 150 mg/dL — ABNORMAL HIGH (ref 70–99)

## 2021-05-04 LAB — ABO/RH: ABO/RH(D): B POS

## 2021-05-04 LAB — POCT PREGNANCY, URINE: Preg Test, Ur: NEGATIVE

## 2021-05-04 SURGERY — HYSTERECTOMY, TOTAL, LAPAROSCOPIC, WITH SALPINGECTOMY
Anesthesia: General | Site: Abdomen

## 2021-05-04 MED ORDER — ORAL CARE MOUTH RINSE: 15.0000 mL | Freq: Once | OROMUCOSAL | Status: AC

## 2021-05-04 MED ORDER — FENTANYL CITRATE (PF) 100 MCG/2ML IJ SOLN
25.0000 ug | INTRAMUSCULAR | Status: DC | PRN
  Administered 2021-05-04 (×3): 25 ug via INTRAVENOUS

## 2021-05-04 MED ORDER — CHLORHEXIDINE GLUCONATE 0.12 % MT SOLN: 15.0000 mL | Freq: Once | OROMUCOSAL | Status: AC

## 2021-05-04 MED ORDER — SODIUM CHLORIDE 0.9 % IV SOLN
INTRAVENOUS | Status: AC
  Filled 2021-05-04: qty 2

## 2021-05-04 MED ORDER — 0.9 % SODIUM CHLORIDE (POUR BTL) OPTIME
TOPICAL | Status: DC | PRN
  Administered 2021-05-04: 200 mL

## 2021-05-04 MED ORDER — SODIUM CHLORIDE 0.9 % IV SOLN: INTRAVENOUS | Status: DC

## 2021-05-04 MED ORDER — PROPOFOL 10 MG/ML IV BOLUS
INTRAVENOUS | Status: AC
  Filled 2021-05-04: qty 20

## 2021-05-04 MED ORDER — ONDANSETRON HCL 4 MG/2ML IJ SOLN
INTRAMUSCULAR | Status: DC | PRN
  Administered 2021-05-04: 4 mg via INTRAVENOUS

## 2021-05-04 MED ORDER — LIDOCAINE HCL (CARDIAC) PF 100 MG/5ML IV SOSY
PREFILLED_SYRINGE | INTRAVENOUS | Status: DC | PRN
  Administered 2021-05-04: 100 mg via INTRAVENOUS

## 2021-05-04 MED ORDER — PROPOFOL 500 MG/50ML IV EMUL
INTRAVENOUS | Status: AC
  Filled 2021-05-04: qty 50

## 2021-05-04 MED ORDER — ROCURONIUM BROMIDE 100 MG/10ML IV SOLN
INTRAVENOUS | Status: DC | PRN
  Administered 2021-05-04: 50 mg via INTRAVENOUS
  Administered 2021-05-04: 10 mg via INTRAVENOUS

## 2021-05-04 MED ORDER — KETOROLAC TROMETHAMINE 30 MG/ML IJ SOLN
30.0000 mg | Freq: Once | INTRAMUSCULAR | Status: AC
  Administered 2021-05-04: 30 mg via INTRAVENOUS

## 2021-05-04 MED ORDER — SUGAMMADEX SODIUM 200 MG/2ML IV SOLN
INTRAVENOUS | Status: DC | PRN
  Administered 2021-05-04: 100 mg via INTRAVENOUS
  Administered 2021-05-04: 200 mg via INTRAVENOUS

## 2021-05-04 MED ORDER — HYDROCODONE-ACETAMINOPHEN 5-325 MG PO TABS
1.0000 | ORAL_TABLET | ORAL | Status: DC | PRN
  Administered 2021-05-04: 1 via ORAL

## 2021-05-04 MED ORDER — LACTATED RINGERS IV SOLN: INTRAVENOUS | Status: DC | PRN

## 2021-05-04 MED ORDER — MORPHINE SULFATE (PF) 2 MG/ML IV SOLN: 1.0000 mg | INTRAVENOUS | Status: DC | PRN

## 2021-05-04 MED ORDER — ONDANSETRON HCL 4 MG/2ML IJ SOLN
INTRAMUSCULAR | Status: AC
  Administered 2021-05-04: 4 mg via INTRAVENOUS
  Filled 2021-05-04: qty 2

## 2021-05-04 MED ORDER — ACETAMINOPHEN 650 MG RE SUPP
650.0000 mg | RECTAL | Status: DC | PRN
  Filled 2021-05-04: qty 1

## 2021-05-04 MED ORDER — MIDAZOLAM HCL 2 MG/2ML IJ SOLN
INTRAMUSCULAR | Status: DC | PRN
  Administered 2021-05-04: 2 mg via INTRAVENOUS

## 2021-05-04 MED ORDER — BUPIVACAINE HCL (PF) 0.5 % IJ SOLN
INTRAMUSCULAR | Status: AC
  Filled 2021-05-04: qty 30

## 2021-05-04 MED ORDER — DEXAMETHASONE SODIUM PHOSPHATE 10 MG/ML IJ SOLN
INTRAMUSCULAR | Status: AC
  Filled 2021-05-04: qty 1

## 2021-05-04 MED ORDER — KETOROLAC TROMETHAMINE 30 MG/ML IJ SOLN
INTRAMUSCULAR | Status: AC
  Filled 2021-05-04: qty 1

## 2021-05-04 MED ORDER — PROPOFOL 10 MG/ML IV BOLUS
INTRAVENOUS | Status: DC | PRN
Start: 2021-05-04 — End: 2021-05-04
  Administered 2021-05-04: 200 mg via INTRAVENOUS

## 2021-05-04 MED ORDER — FENTANYL CITRATE (PF) 100 MCG/2ML IJ SOLN
INTRAMUSCULAR | Status: AC
  Filled 2021-05-04: qty 2

## 2021-05-04 MED ORDER — LACTATED RINGERS IV SOLN: INTRAVENOUS | Status: DC

## 2021-05-04 MED ORDER — ONDANSETRON HCL 4 MG/2ML IJ SOLN: 4.0000 mg | Freq: Once | INTRAMUSCULAR | Status: AC | PRN

## 2021-05-04 MED ORDER — PROMETHAZINE HCL 25 MG/ML IJ SOLN: 6.2500 mg | Freq: Once | INTRAMUSCULAR | Status: AC

## 2021-05-04 MED ORDER — POVIDONE-IODINE 10 % EX SWAB
2.0000 "application " | Freq: Once | CUTANEOUS | Status: AC
  Administered 2021-05-04: 2 via TOPICAL

## 2021-05-04 MED ORDER — ACETAMINOPHEN 325 MG PO TABS: 650.0000 mg | ORAL_TABLET | ORAL | Status: DC | PRN

## 2021-05-04 MED ORDER — SODIUM CHLORIDE FLUSH 0.9 % IV SOLN
INTRAVENOUS | Status: AC
  Filled 2021-05-04: qty 10

## 2021-05-04 MED ORDER — CHLORHEXIDINE GLUCONATE 0.12 % MT SOLN
OROMUCOSAL | Status: AC
  Administered 2021-05-04: 15 mL via OROMUCOSAL
  Filled 2021-05-04: qty 15

## 2021-05-04 MED ORDER — SODIUM CHLORIDE 0.9 % IV SOLN
2.0000 g | INTRAVENOUS | Status: AC
  Administered 2021-05-04: 2 g via INTRAVENOUS

## 2021-05-04 MED ORDER — BUPIVACAINE HCL (PF) 0.5 % IJ SOLN
INTRAMUSCULAR | Status: DC | PRN
  Administered 2021-05-04: 12 mL

## 2021-05-04 MED ORDER — DEXAMETHASONE SODIUM PHOSPHATE 10 MG/ML IJ SOLN
INTRAMUSCULAR | Status: DC | PRN
  Administered 2021-05-04: 5 mg via INTRAVENOUS

## 2021-05-04 MED ORDER — HYDROCODONE-ACETAMINOPHEN 5-325 MG PO TABS: 1.0000 | ORAL_TABLET | ORAL | 0 refills | Status: DC | PRN

## 2021-05-04 MED ORDER — FENTANYL CITRATE (PF) 100 MCG/2ML IJ SOLN
INTRAMUSCULAR | Status: DC | PRN
  Administered 2021-05-04: 100 ug via INTRAVENOUS

## 2021-05-04 MED ORDER — FENTANYL CITRATE (PF) 100 MCG/2ML IJ SOLN
INTRAMUSCULAR | Status: AC
  Administered 2021-05-04: 25 ug via INTRAVENOUS
  Filled 2021-05-04: qty 2

## 2021-05-04 MED ORDER — PHENYLEPHRINE HCL-NACL 20-0.9 MG/250ML-% IV SOLN
INTRAVENOUS | Status: DC | PRN
Start: 2021-05-04 — End: 2021-05-04
  Administered 2021-05-04: 16 ug/min via INTRAVENOUS

## 2021-05-04 MED ORDER — MIDAZOLAM HCL 2 MG/2ML IJ SOLN
INTRAMUSCULAR | Status: AC
  Filled 2021-05-04: qty 2

## 2021-05-04 MED ORDER — PROMETHAZINE HCL 25 MG/ML IJ SOLN
INTRAMUSCULAR | Status: AC
  Administered 2021-05-04: 6.25 mg via INTRAVENOUS
  Filled 2021-05-04: qty 1

## 2021-05-04 MED ORDER — DEXMEDETOMIDINE (PRECEDEX) IN NS 20 MCG/5ML (4 MCG/ML) IV SYRINGE
PREFILLED_SYRINGE | INTRAVENOUS | Status: DC | PRN
  Administered 2021-05-04: 20 ug via INTRAVENOUS

## 2021-05-04 MED ORDER — PHENYLEPHRINE 40 MCG/ML (10ML) SYRINGE FOR IV PUSH (FOR BLOOD PRESSURE SUPPORT)
PREFILLED_SYRINGE | INTRAVENOUS | Status: DC | PRN
  Administered 2021-05-04: 160 ug via INTRAVENOUS
  Administered 2021-05-04: 80 ug via INTRAVENOUS
  Administered 2021-05-04: 160 ug via INTRAVENOUS

## 2021-05-04 MED ORDER — HYDROCODONE-ACETAMINOPHEN 5-325 MG PO TABS
ORAL_TABLET | ORAL | Status: AC
  Filled 2021-05-04: qty 1

## 2021-05-04 SURGICAL SUPPLY — 61 items
ADH SKN CLS APL DERMABOND .7 (GAUZE/BANDAGES/DRESSINGS) ×2
APL PRP STRL LF DISP 70% ISPRP (MISCELLANEOUS) ×2
APL SRG 38 LTWT LNG FL B (MISCELLANEOUS)
APPLICATOR ARISTA FLEXITIP XL (MISCELLANEOUS) IMPLANT
BAG DRN RND TRDRP ANRFLXCHMBR (UROLOGICAL SUPPLIES) ×2
BAG LAPAROSCOPIC 12 15 PORT 16 (BASKET) IMPLANT
BAG RETRIEVAL 12/15 (BASKET)
BAG URINE DRAIN 2000ML AR STRL (UROLOGICAL SUPPLIES) ×3 IMPLANT
BLADE SURG SZ11 CARB STEEL (BLADE) ×3 IMPLANT
CATH FOLEY 2WAY  5CC 16FR (CATHETERS) ×1
CATH FOLEY 2WAY 5CC 16FR (CATHETERS) ×2
CATH URTH 16FR FL 2W BLN LF (CATHETERS) ×2 IMPLANT
CHLORAPREP W/TINT 26 (MISCELLANEOUS) ×3 IMPLANT
DEFOGGER SCOPE WARMER CLEARIFY (MISCELLANEOUS) ×3 IMPLANT
DERMABOND ADVANCED (GAUZE/BANDAGES/DRESSINGS) ×1
DERMABOND ADVANCED .7 DNX12 (GAUZE/BANDAGES/DRESSINGS) ×2 IMPLANT
DEVICE SUTURE ENDOST 10MM (ENDOMECHANICALS) ×1 IMPLANT
DRAPE CAMERA CLOSED 9X96 (DRAPES) ×3 IMPLANT
DRSG TEGADERM 2-3/8X2-3/4 SM (GAUZE/BANDAGES/DRESSINGS) IMPLANT
GAUZE 4X4 16PLY ~~LOC~~+RFID DBL (SPONGE) ×6 IMPLANT
GLOVE SURG ENC MOIS LTX SZ8 (GLOVE) ×9 IMPLANT
GLOVE SURG UNDER LTX SZ8 (GLOVE) ×9 IMPLANT
GOWN STRL REUS W/ TWL LRG LVL3 (GOWN DISPOSABLE) ×8 IMPLANT
GOWN STRL REUS W/ TWL XL LVL3 (GOWN DISPOSABLE) ×6 IMPLANT
GOWN STRL REUS W/TWL LRG LVL3 (GOWN DISPOSABLE) ×12
GOWN STRL REUS W/TWL XL LVL3 (GOWN DISPOSABLE) ×9
GRASPER SUT TROCAR 14GX15 (MISCELLANEOUS) ×3 IMPLANT
HEMOSTAT ARISTA ABSORB 3G PWDR (HEMOSTASIS) IMPLANT
IRRIGATION STRYKERFLOW (MISCELLANEOUS) ×2 IMPLANT
IRRIGATOR STRYKERFLOW (MISCELLANEOUS) ×3
IV LACTATED RINGERS 1000ML (IV SOLUTION) ×6 IMPLANT
KIT PINK PAD W/HEAD ARE REST (MISCELLANEOUS) ×3
KIT PINK PAD W/HEAD ARM REST (MISCELLANEOUS) ×2 IMPLANT
KIT TURNOVER CYSTO (KITS) ×3 IMPLANT
LABEL OR SOLS (LABEL) ×3 IMPLANT
MANIFOLD NEPTUNE II (INSTRUMENTS) ×3 IMPLANT
MANIPULATOR VCARE LG CRV RETR (MISCELLANEOUS) IMPLANT
MANIPULATOR VCARE SML CRV RETR (MISCELLANEOUS) IMPLANT
MANIPULATOR VCARE STD CRV RETR (MISCELLANEOUS) ×1 IMPLANT
NEEDLE VERESS 14GA 120MM (NEEDLE) ×3 IMPLANT
NS IRRIG 500ML POUR BTL (IV SOLUTION) ×3 IMPLANT
OCCLUDER COLPOPNEUMO (BALLOONS) ×3 IMPLANT
PACK GYN LAPAROSCOPIC (MISCELLANEOUS) ×3 IMPLANT
PAD OB MATERNITY 4.3X12.25 (PERSONAL CARE ITEMS) ×3 IMPLANT
PAD PREP 24X41 OB/GYN DISP (PERSONAL CARE ITEMS) ×3 IMPLANT
SCISSORS METZENBAUM CVD 33 (INSTRUMENTS) ×1 IMPLANT
SCRUB EXIDINE 4% CHG 4OZ (MISCELLANEOUS) ×3 IMPLANT
SET CYSTO W/LG BORE CLAMP LF (SET/KITS/TRAYS/PACK) ×3 IMPLANT
SET TRI-LUMEN FLTR TB AIRSEAL (TUBING) ×3 IMPLANT
SHEARS HARMONIC ACE PLUS 36CM (ENDOMECHANICALS) ×3 IMPLANT
SLEEVE ENDOPATH XCEL 5M (ENDOMECHANICALS) ×3 IMPLANT
SPONGE GAUZE 2X2 8PLY STRL LF (GAUZE/BANDAGES/DRESSINGS) IMPLANT
SURGILUBE 2OZ TUBE FLIPTOP (MISCELLANEOUS) ×3 IMPLANT
SUT ENDO VLOC 180-0-8IN (SUTURE) ×1 IMPLANT
SUT VIC AB 0 CT1 36 (SUTURE) ×3 IMPLANT
SUT VIC AB 4-0 FS2 27 (SUTURE) ×3 IMPLANT
SYR 10ML LL (SYRINGE) ×3 IMPLANT
SYR 50ML LL SCALE MARK (SYRINGE) ×3 IMPLANT
TROCAR PORT AIRSEAL 8X100 (TROCAR) ×3 IMPLANT
TROCAR XCEL NON-BLD 5MMX100MML (ENDOMECHANICALS) ×3 IMPLANT
WATER STERILE IRR 500ML POUR (IV SOLUTION) ×3 IMPLANT

## 2021-05-04 NOTE — Anesthesia Preprocedure Evaluation (Signed)
Anesthesia Evaluation  Patient identified by MRN, date of birth, ID band Patient awake    Reviewed: Allergy & Precautions, NPO status , Patient's Chart, lab work & pertinent test results  Airway Mallampati: II  TM Distance: >3 FB Neck ROM: full    Dental  (+) Teeth Intact   Pulmonary neg pulmonary ROS, asthma , sleep apnea and Continuous Positive Airway Pressure Ventilation ,    Pulmonary exam normal  + decreased breath sounds      Cardiovascular Exercise Tolerance: Good hypertension, Pt. on medications negative cardio ROS Normal cardiovascular exam Rhythm:Regular Rate:Normal     Neuro/Psych Anxiety negative neurological ROS  negative psych ROS   GI/Hepatic negative GI ROS, Neg liver ROS, GERD  ,  Endo/Other  negative endocrine ROSdiabetes, Well Controlled, Type 2, Oral Hypoglycemic Agents  Renal/GU   negative genitourinary   Musculoskeletal   Abdominal (+) + obese,   Peds negative pediatric ROS (+)  Hematology negative hematology ROS (+) Blood dyscrasia, anemia ,   Anesthesia Other Findings Past Medical History: No date: Allergy No date: Anemia 03/05/2019: Anxiety No date: Asthma 10/2019: BRCA negative     Comment:  MyRisk neg; IBIS=7.8% No date: Diabetes mellitus without complication (HCC)     Comment:  type 2 No date: Family history of adverse reaction to anesthesia     Comment:  Father - slow to wake No date: Family history of pancreatic cancer No date: GERD (gastroesophageal reflux disease) No date: Hypertension No date: Sleep apnea No date: Wears contact lenses  Past Surgical History: 05/25/2017: AXILLARY LYMPH NODE BIOPSY; Right     Comment:  LYMPHADENITIS. NEGATIVE FOR MALIGNANCY.  05/25/2017: BREAST BIOPSY; Right     Comment:  neg/NEGATIVE FOR CARCINOMA. INFLAMMATION AND SQUAMOUS               METAPLASIA OF A FOCAL DUCT  No date: CESAREAN SECTION     Comment:  2002,2006,2007 06/23/2020:  COLONOSCOPY WITH PROPOFOL; N/A     Comment:  Procedure: COLONOSCOPY WITH PROPOFOL;  Surgeon:               Virgel Manifold, MD;  Location: [place];              Service: Endoscopy;  Laterality: N/A;  Diabetic  sleep               apnea priority 4 2007: TUBAL LIGATION  BMI    Body Mass Index: 36.58 kg/m      Reproductive/Obstetrics negative OB ROS                             Anesthesia Physical Anesthesia Plan  ASA: 3  Anesthesia Plan: General   Post-op Pain Management:    Induction: Intravenous  PONV Risk Score and Plan: 1 and Ondansetron and Dexamethasone  Airway Management Planned: Oral ETT  Additional Equipment:   Intra-op Plan:   Post-operative Plan: Extubation in OR  Informed Consent: I have reviewed the patients History and Physical, chart, labs and discussed the procedure including the risks, benefits and alternatives for the proposed anesthesia with the patient or authorized representative who has indicated his/her understanding and acceptance.     Dental Advisory Given  Plan Discussed with: CRNA and Surgeon  Anesthesia Plan Comments:         Anesthesia Quick Evaluation

## 2021-05-04 NOTE — Anesthesia Procedure Notes (Signed)
Procedure Name: Intubation Date/Time: 05/04/2021 10:51 AM Performed by: Beverely Low, CRNA Pre-anesthesia Checklist: Patient identified, Patient being monitored, Timeout performed, Emergency Drugs available and Suction available Patient Re-evaluated:Patient Re-evaluated prior to induction Oxygen Delivery Method: Circle system utilized Preoxygenation: Pre-oxygenation with 100% oxygen Induction Type: IV induction Ventilation: Mask ventilation without difficulty Laryngoscope Size: 3 and McGraph Grade View: Grade I Tube type: Oral Tube size: 7.0 mm Number of attempts: 1 Airway Equipment and Method: Stylet Placement Confirmation: ETT inserted through vocal cords under direct vision, positive ETCO2 and breath sounds checked- equal and bilateral Secured at: 21 cm Tube secured with: Tape Dental Injury: Teeth and Oropharynx as per pre-operative assessment

## 2021-05-04 NOTE — Discharge Instructions (Signed)

## 2021-05-04 NOTE — Op Note (Signed)
Operative Report:  PRE-OP DIAGNOSIS: Abnormal Uterine Bleeding AUB N93.9  Pelvic pain R10.2 Leiomyoma D21.9   POST-OP DIAGNOSIS: Abnormal Uterine Bleeding AUB N93.9  Pelvic pain R10.2 Leiomyoma D21.9   PROCEDURE: Procedure(s): TOTAL LAPAROSCOPIC HYSTERECTOMY WITH BILATERAL SALPINGECTOMY CYSTOSCOPY  SURGEON: Barnett Applebaum, MD, FACOG  ASSISTANT: Lawernce Pitts, RNFA   ANESTHESIA: General endotracheal anesthesia  ESTIMATED BLOOD LOSS: 20 mL  SPECIMENS: Uterus, Tubes.  COMPLICATIONS: None  DISPOSITION: stable to PACU  FINDINGS: Intraabdominal adhesions were noted especially overlying the lower uterine segment (prior cesarean) and with omentum to upper anterior abdominal wall (not involved or obstructive for this surgery). Fibroid uterus. Normal ovaries.  PROCEDURE:  The patient was taken to the OR where anesthesia was administed. She was prepped and draped in the normal sterile fashion in the dorsal lithotomy position in the [place] stirrups. A time out was performed. A Graves speculum was inserted, the cervix was grasped with a single tooth tenaculum and the endometrial cavity was sounded. The cervix was progressively dilated to a size 18 [place] with [employer] dilators. A V-Care uterine manipulator was inserted in the usual fashion without incident. Gloves were changed and attention was turned to the abdomen.   An infraumbilical transverse 35mm skin incision was made with the scalpel after local anesthesia applied to the skin. A Veress-step needle was inserted in the usual fashion and confirmed using the hanging drop technique. A pneumoperitoneum was obtained by insufflation of CO2 (opening pressure of 62mmHg) to 70mmHg. An 5 mm trocar is then placed under direct visualization with the laparoscope. A diagnostic laparoscopy was performed yielding the previously described findings. Attention was turned to the left lower quadrant where after visualization of the inferior epigastric vessels a  36mm skin incision was made with the scalpel. A 5 mm laparoscopic port was inserted. The same procedure was repeated in the right lower quadrant with a 8 mm trocar. Lysis of adhesion performed for those overlying the lower uterine segment with careful dissection to avoid the bladder.  Attention was turned to the left aspect of the uterus, where after visualization of the ureter, the round ligament was coagulated and transected using the 22mm Harmonic Scapel. The anterior and posterior leafs of the broad ligament were dissected off as the anterior one was coagulated and transected in a caudal direction towards the cuff of the uterine manipulator.  Attention was then turned to the left fallopian tube which was recognized by visualization of the fimbria. The tube is excised to its attachment to the uterus. The uterine-ovarian ligament and its blood vessels were carefully coagulated and transected using the Harmonic scapel.  Attention was turned to the right aspect of the uterus where the same procedure was performed.  The vesicouterine reflection of the peritoneum was dissected with the harmonic scapel and the bladder flap was created bluntly.  The uterine vessels were coagulated and transected bilaterally using first bipolar cautery and then the harmonic scapel. A 360 degree, circumferential colpotomy was done to completely amputate the uterus with cervix and tubes. Once the specimen was amputated it was delivered through the vagina.   The colpotomy was repaired in a simple running fashion using a delayed absorbable suture with an endo-stitch device.  Vaginal exam confirms complete closure.  The cavity was copiously irrigated. A survey of the pelvic cavity revealed adequate hemostasis and no injury to bowel, bladder, or ureter.   A diagnostic cystoscopy was performed using saline distension of bladder with no lesions or injuries noted.  Bilateral urine flow  from each ureteral orifice is visualized.  At this point  the procedure was finalized. The umbilical fascia incision is closed with a vicryl suture using the fascia closure device. All the instruments were removed from the patient's body. Gas was expelled and patient is leveled.  Incisions are closed with skin adhesive.    Patient goes to recovery room in stable condition.  All sponge, instrument, and needle counts are correct x2.     Barnett Applebaum, MD, [facility], [facility] 05/04/2021  12:40 PM

## 2021-05-04 NOTE — Interval H&P Note (Signed)
History and Physical Interval Note:  05/04/2021 10:15 AM  Donna Reyes  has presented today for surgery, with the diagnosis of Abnormal Uterine Bleeding AUB N93.9  Pelvic pain R10.2 Leiomyoma D21.9.  The various methods of treatment have been discussed with the patient and family. After consideration of risks, benefits and other options for treatment, the patient has consented to  Procedure(s): TOTAL LAPAROSCOPIC HYSTERECTOMY WITH SALPINGECTOMY (Bilateral) CYSTOSCOPY (N/A) as a surgical intervention.  The patient's history has been reviewed, patient examined, no change in status, stable for surgery.  I have reviewed the patient's chart and labs.  Questions were answered to the patient's satisfaction.     Hoyt Koch

## 2021-05-04 NOTE — Transfer of Care (Signed)
Immediate Anesthesia Transfer of Care Note  Patient: Deniah Saia  Procedure(s) Performed: TOTAL LAPAROSCOPIC HYSTERECTOMY WITH BILATERAL SALPINGECTOMY (Bilateral: Abdomen) CYSTOSCOPY  Patient Location: PACU  Anesthesia Type:General  Level of Consciousness: drowsy  Airway & Oxygen Therapy: Patient Spontanous Breathing and Patient connected to nasal cannula oxygen  Post-op Assessment: Report given to RN and Post -op Vital signs reviewed and stable  Post vital signs: Reviewed and stable  Last Vitals:  Vitals Value Taken Time  BP 125/85 05/04/21 1300  Temp 36 C 05/04/21 1254  Pulse 87 05/04/21 1302  Resp 21 05/04/21 1302  SpO2 100 % 05/04/21 1302  Vitals shown include unvalidated device data.  Last Pain:  Vitals:   05/04/21 0829  TempSrc: Temporal  PainSc: 0-No pain         Complications: No notable events documented.

## 2021-05-04 NOTE — Anesthesia Procedure Notes (Signed)
Procedure Name: Intubation Date/Time: 05/04/2021 10:52 AM Performed by: Beverely Low, CRNA Pre-anesthesia Checklist: Patient identified, Patient being monitored, Timeout performed, Emergency Drugs available and Suction available Patient Re-evaluated:Patient Re-evaluated prior to induction Oxygen Delivery Method: Circle system utilized Preoxygenation: Pre-oxygenation with 100% oxygen Induction Type: IV induction Ventilation: Mask ventilation without difficulty Laryngoscope Size: 3 and McGraph Grade View: Grade I Tube type: Oral Tube size: 7.0 mm Number of attempts: 1 Airway Equipment and Method: Stylet Placement Confirmation: ETT inserted through vocal cords under direct vision, positive ETCO2 and breath sounds checked- equal and bilateral Secured at: 21 cm Tube secured with: Tape Dental Injury: Teeth and Oropharynx as per pre-operative assessment

## 2021-05-05 ENCOUNTER — Other Ambulatory Visit: Payer: Self-pay | Admitting: Obstetrics and Gynecology

## 2021-05-05 ENCOUNTER — Encounter: Payer: Self-pay | Admitting: Obstetrics & Gynecology

## 2021-05-05 ENCOUNTER — Telehealth: Payer: Self-pay

## 2021-05-05 DIAGNOSIS — Z90710 Acquired absence of both cervix and uterus: Secondary | ICD-10-CM

## 2021-05-05 LAB — SURGICAL PATHOLOGY

## 2021-05-05 MED ORDER — OXYCODONE HCL 5 MG PO TABS: 5.0000 mg | ORAL_TABLET | Freq: Four times a day (QID) | ORAL | 0 refills | Status: DC | PRN

## 2021-05-05 NOTE — Progress Notes (Signed)
R for roxicodone sent- pharmacy reports they did not have Norco.

## 2021-05-05 NOTE — Telephone Encounter (Signed)
Please call the pharmacy to see what the problem was with the other prescription and verify that they did not dispense narcotics to her already. Thank you

## 2021-05-05 NOTE — Telephone Encounter (Signed)
I called the pharmacy and the medicine is on back order

## 2021-05-05 NOTE — Telephone Encounter (Signed)
FML[employer]/DIS[employer] company calling to verify information; also faxing a form; ref # [id number].  [phone]

## 2021-05-05 NOTE — Telephone Encounter (Signed)
New rx sent

## 2021-05-06 ENCOUNTER — Encounter: Payer: Self-pay | Admitting: Obstetrics & Gynecology

## 2021-05-06 ENCOUNTER — Emergency Department: Payer: No Typology Code available for payment source

## 2021-05-06 ENCOUNTER — Inpatient Hospital Stay
Admission: EM | Admit: 2021-05-06 | Discharge: 2021-05-11 | DRG: 760 | Disposition: A | Discharge status: Home / Self Care | Payer: No Typology Code available for payment source | Attending: Obstetrics & Gynecology | Admitting: Obstetrics & Gynecology

## 2021-05-06 ENCOUNTER — Other Ambulatory Visit: Payer: Self-pay

## 2021-05-06 ENCOUNTER — Other Ambulatory Visit: Payer: Self-pay | Admitting: Obstetrics & Gynecology

## 2021-05-06 DIAGNOSIS — E669 Obesity, unspecified: Secondary | ICD-10-CM | POA: Diagnosis present

## 2021-05-06 DIAGNOSIS — K56609 Unspecified intestinal obstruction, unspecified as to partial versus complete obstruction: Secondary | ICD-10-CM

## 2021-05-06 DIAGNOSIS — Z79899 Other long term (current) drug therapy: Secondary | ICD-10-CM

## 2021-05-06 DIAGNOSIS — Z7951 Long term (current) use of inhaled steroids: Secondary | ICD-10-CM

## 2021-05-06 DIAGNOSIS — G4730 Sleep apnea, unspecified: Secondary | ICD-10-CM | POA: Diagnosis present

## 2021-05-06 DIAGNOSIS — J45909 Unspecified asthma, uncomplicated: Secondary | ICD-10-CM | POA: Diagnosis present

## 2021-05-06 DIAGNOSIS — E11628 Type 2 diabetes mellitus with other skin complications: Secondary | ICD-10-CM | POA: Diagnosis present

## 2021-05-06 DIAGNOSIS — Z7984 Long term (current) use of oral hypoglycemic drugs: Secondary | ICD-10-CM

## 2021-05-06 DIAGNOSIS — N939 Abnormal uterine and vaginal bleeding, unspecified: Secondary | ICD-10-CM | POA: Diagnosis present

## 2021-05-06 DIAGNOSIS — K9189 Other postprocedural complications and disorders of digestive system: Secondary | ICD-10-CM | POA: Diagnosis not present

## 2021-05-06 DIAGNOSIS — Z90710 Acquired absence of both cervix and uterus: Secondary | ICD-10-CM

## 2021-05-06 DIAGNOSIS — K567 Ileus, unspecified: Secondary | ICD-10-CM

## 2021-05-06 DIAGNOSIS — Z833 Family history of diabetes mellitus: Secondary | ICD-10-CM

## 2021-05-06 DIAGNOSIS — G4733 Obstructive sleep apnea (adult) (pediatric): Secondary | ICD-10-CM | POA: Diagnosis present

## 2021-05-06 DIAGNOSIS — F419 Anxiety disorder, unspecified: Secondary | ICD-10-CM | POA: Diagnosis present

## 2021-05-06 DIAGNOSIS — K565 Intestinal adhesions [bands], unspecified as to partial versus complete obstruction: Secondary | ICD-10-CM | POA: Diagnosis present

## 2021-05-06 DIAGNOSIS — K219 Gastro-esophageal reflux disease without esophagitis: Secondary | ICD-10-CM | POA: Diagnosis present

## 2021-05-06 DIAGNOSIS — N8003 Adenomyosis of the uterus: Principal | ICD-10-CM | POA: Diagnosis present

## 2021-05-06 DIAGNOSIS — R102 Pelvic and perineal pain: Secondary | ICD-10-CM | POA: Diagnosis present

## 2021-05-06 DIAGNOSIS — Z6836 Body mass index (BMI) 36.0-36.9, adult: Secondary | ICD-10-CM

## 2021-05-06 DIAGNOSIS — Z20822 Contact with and (suspected) exposure to covid-19: Secondary | ICD-10-CM | POA: Diagnosis present

## 2021-05-06 DIAGNOSIS — K668 Other specified disorders of peritoneum: Secondary | ICD-10-CM

## 2021-05-06 DIAGNOSIS — I10 Essential (primary) hypertension: Secondary | ICD-10-CM | POA: Diagnosis present

## 2021-05-06 HISTORY — DX: Unspecified intestinal obstruction, unspecified as to partial versus complete obstruction: K56.609

## 2021-05-06 LAB — COMPREHENSIVE METABOLIC PANEL
ALT: 12 U/L (ref 0–44)
AST: 17 U/L (ref 15–41)
Albumin: 3.8 g/dL (ref 3.5–5.0)
Alkaline Phosphatase: 62 U/L (ref 38–126)
Anion gap: 8 (ref 5–15)
BUN: 13 mg/dL (ref 6–20)
CO2: 26 mmol/L (ref 22–32)
Calcium: 8.6 mg/dL — ABNORMAL LOW (ref 8.9–10.3)
Chloride: 99 mmol/L (ref 98–111)
Creatinine, Ser: 0.87 mg/dL (ref 0.44–1.00)
GFR, Estimated: >60 mL/min (ref >60)
Glucose, Bld: 148 mg/dL — ABNORMAL HIGH (ref 70–99)
Potassium: 3.8 mmol/L (ref 3.5–5.1)
Sodium: 133 mmol/L — ABNORMAL LOW (ref 135–145)
Total Bilirubin: 1.4 mg/dL — ABNORMAL HIGH (ref 0.3–1.2)
Total Protein: 7.7 g/dL (ref 6.5–8.1)

## 2021-05-06 LAB — CBC
HCT: 44 % (ref 36.0–46.0)
Hemoglobin: 14.2 g/dL (ref 12.0–15.0)
MCH: 27.6 pg (ref 26.0–34.0)
MCHC: 32.3 g/dL (ref 30.0–36.0)
MCV: 85.6 fL (ref 80.0–100.0)
Platelets: 473 10*3/uL — ABNORMAL HIGH (ref 150–400)
RBC: 5.14 MIL/uL — ABNORMAL HIGH (ref 3.87–5.11)
RDW: 12.8 % (ref 11.5–15.5)
WBC: 16.6 10*3/uL — ABNORMAL HIGH (ref 4.0–10.5)
nRBC: 0 % (ref 0.0–0.2)

## 2021-05-06 LAB — LIPASE, BLOOD: Lipase: 171 U/L — ABNORMAL HIGH (ref 11–51)

## 2021-05-06 LAB — URINALYSIS, ROUTINE W REFLEX MICROSCOPIC
Glucose, UA: NEGATIVE mg/dL
Ketones, ur: 40 mg/dL — AB
Leukocytes,Ua: NEGATIVE
Nitrite: NEGATIVE
Protein, ur: 100 mg/dL — AB
Specific Gravity, Urine: >1.03 — ABNORMAL HIGH (ref 1.005–1.030)
pH: 6 (ref 5.0–8.0)

## 2021-05-06 LAB — RESP PANEL BY RT-PCR (FLU A&B, COVID) ARPGX2
Influenza A by PCR: NEGATIVE
Influenza B by PCR: NEGATIVE
SARS Coronavirus 2 by RT PCR: NEGATIVE

## 2021-05-06 LAB — LACTIC ACID, PLASMA
Lactic Acid, Venous: 1.6 mmol/L (ref 0.5–1.9)
Lactic Acid, Venous: 1.3 mmol/L (ref 0.5–1.9)

## 2021-05-06 LAB — TYPE AND SCREEN
ABO/RH(D): B POS
Antibody Screen: NEGATIVE

## 2021-05-06 MED ORDER — IOHEXOL 300 MG/ML  SOLN
100.0000 mL | Freq: Once | INTRAMUSCULAR | Status: AC | PRN
  Administered 2021-05-06: 100 mL via INTRAVENOUS

## 2021-05-06 MED ORDER — METOCLOPRAMIDE HCL 5 MG/ML IJ SOLN: 10.0000 mg | Freq: Four times a day (QID) | INTRAMUSCULAR | Status: DC | PRN

## 2021-05-06 MED ORDER — ONDANSETRON 4 MG PO TBDP: 4.0000 mg | ORAL_TABLET | Freq: Four times a day (QID) | ORAL | 0 refills | Status: DC | PRN

## 2021-05-06 MED ORDER — HYDROMORPHONE HCL 1 MG/ML IJ SOLN
1.0000 mg | Freq: Once | INTRAMUSCULAR | Status: AC
  Administered 2021-05-06: 1 mg via INTRAVENOUS
  Filled 2021-05-06: qty 1

## 2021-05-06 MED ORDER — LACTATED RINGERS IV SOLN: INTRAVENOUS | Status: DC

## 2021-05-06 MED ORDER — PANTOPRAZOLE SODIUM 40 MG IV SOLR
40.0000 mg | Freq: Every day | INTRAVENOUS | Status: DC
  Administered 2021-05-07 – 2021-05-10 (×4): 40 mg via INTRAVENOUS
  Filled 2021-05-06 (×5): qty 40

## 2021-05-06 MED ORDER — ALBUTEROL SULFATE (2.5 MG/3ML) 0.083% IN NEBU: 2.5000 mg | INHALATION_SOLUTION | Freq: Four times a day (QID) | RESPIRATORY_TRACT | Status: DC | PRN

## 2021-05-06 MED ORDER — INSULIN ASPART 100 UNIT/ML IJ SOLN
0.0000 [IU] | Freq: Three times a day (TID) | INTRAMUSCULAR | Status: DC
  Administered 2021-05-07: 2 [IU] via SUBCUTANEOUS

## 2021-05-06 MED ORDER — MORPHINE SULFATE (PF) 2 MG/ML IV SOLN
1.0000 mg | INTRAVENOUS | Status: DC | PRN
  Administered 2021-05-07: 2 mg via INTRAVENOUS
  Administered 2021-05-07: 1 mg via INTRAVENOUS
  Filled 2021-05-06 (×2): qty 1

## 2021-05-06 MED ORDER — PHENOL 1.4 % MT LIQD
1.0000 | OROMUCOSAL | Status: DC | PRN
  Administered 2021-05-07: 1 via OROMUCOSAL
  Filled 2021-05-06 (×2): qty 177

## 2021-05-06 MED ORDER — KETOROLAC TROMETHAMINE 30 MG/ML IJ SOLN: 15.0000 mg | Freq: Four times a day (QID) | INTRAMUSCULAR | Status: DC

## 2021-05-06 MED ORDER — LOSARTAN POTASSIUM-HCTZ 100-12.5 MG PO TABS: 1.0000 | ORAL_TABLET | Freq: Every day | ORAL | Status: DC

## 2021-05-06 MED ORDER — MORPHINE SULFATE (PF) 2 MG/ML IV SOLN
2.0000 mg | Freq: Once | INTRAVENOUS | Status: AC
Start: 2021-05-06 — End: 2021-05-06
  Administered 2021-05-06: 2 mg via INTRAVENOUS
  Filled 2021-05-06: qty 1

## 2021-05-06 MED ORDER — METOCLOPRAMIDE HCL 5 MG/ML IJ SOLN
10.0000 mg | Freq: Four times a day (QID) | INTRAMUSCULAR | Status: DC
  Administered 2021-05-07: 10 mg via INTRAVENOUS
  Filled 2021-05-06: qty 2

## 2021-05-06 MED ORDER — HYDROMORPHONE HCL 1 MG/ML IJ SOLN
1.0000 mg | INTRAMUSCULAR | Status: AC
Start: 2021-05-06 — End: 2021-05-06
  Administered 2021-05-06: 1 mg via INTRAVENOUS
  Filled 2021-05-06: qty 1

## 2021-05-06 MED ORDER — SODIUM CHLORIDE 0.9 % IV BOLUS
1000.0000 mL | Freq: Once | INTRAVENOUS | Status: AC
  Administered 2021-05-06: 1000 mL via INTRAVENOUS

## 2021-05-06 MED ORDER — FLUTICASONE FUROATE-VILANTEROL 200-25 MCG/ACT IN AEPB
1.0000 | INHALATION_SPRAY | Freq: Every day | RESPIRATORY_TRACT | Status: DC
  Administered 2021-05-07 – 2021-05-11 (×5): 1 via RESPIRATORY_TRACT
  Filled 2021-05-06: qty 28

## 2021-05-06 MED ORDER — IOHEXOL 350 MG/ML SOLN
75.0000 mL | Freq: Once | INTRAVENOUS | Status: AC | PRN
  Administered 2021-05-06: 75 mL via INTRAVENOUS

## 2021-05-06 MED ORDER — ONDANSETRON HCL 4 MG/2ML IJ SOLN
4.0000 mg | INTRAMUSCULAR | Status: AC
  Administered 2021-05-06: 4 mg via INTRAVENOUS
  Filled 2021-05-06: qty 2

## 2021-05-06 MED ORDER — KETOROLAC TROMETHAMINE 30 MG/ML IJ SOLN
15.0000 mg | Freq: Four times a day (QID) | INTRAMUSCULAR | Status: DC
  Administered 2021-05-07: 15 mg via INTRAVENOUS
  Filled 2021-05-06: qty 1

## 2021-05-06 NOTE — Telephone Encounter (Signed)
Done

## 2021-05-06 NOTE — ED Triage Notes (Signed)
Pt arrives via ems from home, pt is post op hysterectomy from Tuesday here with dr Kenton Kingfisher, pt has been vomiting with abd pain that started this am, states that she felt ok yesterday but sore, this am her abd is swollen and tight

## 2021-05-06 NOTE — ED Notes (Signed)
Patient transported to CT 
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
## 2021-05-06 NOTE — Telephone Encounter (Signed)
Can you send in nausea meds?

## 2021-05-06 NOTE — H&P (Signed)
Donna Reyes is an 46 y.o. female.   Chief Complaint: nausea and vomiting HPI: Patient presented to the ED today with complaints of nausea and vomiting. She had a laparoscopic hysterectomy with Dr. Kenton Kingfisher on 05/04/2021. She has not been able to eat or drink. She is not able to keep down medication. She is having abdominal pain.   She has had a CT scan and General surgery was consulted.   Past Medical History:  Diagnosis Date   Allergy    Anemia    Anxiety 03/05/2019   Asthma    BRCA negative 10/2019   MyRisk neg; IBIS=7.8%   Diabetes mellitus without complication (HCC)    type 2   Family history of adverse reaction to anesthesia    Father - slow to wake   Family history of pancreatic cancer    GERD (gastroesophageal reflux disease)    Hypertension    Sleep apnea    Wears contact lenses     Past Surgical History:  Procedure Laterality Date   AXILLARY LYMPH NODE BIOPSY Right 05/25/2017   LYMPHADENITIS. NEGATIVE FOR MALIGNANCY.    BREAST BIOPSY Right 05/25/2017   neg/NEGATIVE FOR CARCINOMA. INFLAMMATION AND SQUAMOUS METAPLASIA OF A FOCAL DUCT    CESAREAN SECTION     2002,2006,2007   COLONOSCOPY WITH PROPOFOL N/A 06/23/2020   Procedure: COLONOSCOPY WITH PROPOFOL;  Surgeon: Virgel Manifold, MD;  Location: [facility];  Service: Endoscopy;  Laterality: N/A;  Diabetic  sleep apnea priority 4   CYSTOSCOPY N/A 05/04/2021   Procedure: CYSTOSCOPY;  Surgeon: Gae Dry, MD;  Location: [facility];  Service: Gynecology;  Laterality: N/A;   TOTAL LAPAROSCOPIC HYSTERECTOMY WITH SALPINGECTOMY Bilateral 05/04/2021   Procedure: TOTAL LAPAROSCOPIC HYSTERECTOMY WITH BILATERAL SALPINGECTOMY;  Surgeon: Gae Dry, MD;  Location: [facility];  Service: Gynecology;  Laterality: Bilateral;   TUBAL LIGATION  2007    Family History  Problem Relation Age of Onset   Diabetes Mother    Heart failure Mother    Kidney disease Mother    Diabetes Maternal Grandmother     Breast cancer Maternal Grandmother 63   Hypertension Father    Hyperlipidemia Father    Atrial fibrillation Sister    ADD / ADHD Son    Heart attack Maternal Grandfather    Diabetes Paternal Grandmother    Hypertension Paternal Grandmother    Prostate cancer Paternal Grandfather    Colon cancer Paternal Grandfather 42   Polycystic ovary syndrome Sister    Polycystic ovary syndrome Sister    Pancreatic cancer Paternal Aunt 51   Ovarian cancer Neg Hx    Social History:  reports that she has never smoked. She has never used smokeless tobacco. She reports that she does not drink alcohol and does not use drugs.  Allergies:  Allergies  Allergen Reactions   Capsicum Shortness Of Breath    Jalapeno peppers   Basil Oil Swelling    Tongue swelling   Lisinopril     fatigue    (Not in a hospital admission)   Results for orders placed or performed during the hospital encounter of 05/06/21 (from the past 48 hour(s))  Lipase, blood     Status: Abnormal   Collection Time: 05/06/21  1:33 PM  Result Value Ref Range   Lipase 171 (H) 11 - 51 U/L    Comment: Performed at [facility], [address] [place],  [address]  Comprehensive metabolic panel     Status: Abnormal  Collection Time: 05/06/21  1:33 PM  Result Value Ref Range   Sodium 133 (L) 135 - 145 mmol/L   Potassium 3.8 3.5 - 5.1 mmol/L   Chloride 99 98 - 111 mmol/L   CO2 26 22 - 32 mmol/L   Glucose, Bld 148 (H) 70 - 99 mg/dL    Comment: Glucose reference range applies only to samples taken after fasting for at least 8 hours.   BUN 13 6 - 20 mg/dL   Creatinine, Ser 0.87 0.44 - 1.00 mg/dL   Calcium 8.6 (L) 8.9 - 10.3 mg/dL   Total Protein 7.7 6.5 - 8.1 g/dL   Albumin 3.8 3.5 - 5.0 g/dL   AST 17 15 - 41 U/L   ALT 12 0 - 44 U/L   Alkaline Phosphatase 62 38 - 126 U/L   Total Bilirubin 1.4 (H) 0.3 - 1.2 mg/dL   GFR, Estimated >60 >60 mL/min    Comment: (NOTE) Calculated using the CKD-EPI Creatinine Equation  (2021)    Anion gap 8 5 - 15    Comment: Performed at [facility], [address] [place], [place] [address]  CBC     Status: Abnormal   Collection Time: 05/06/21  1:33 PM  Result Value Ref Range   WBC 16.6 (H) 4.0 - 10.5 K/uL   RBC 5.14 (H) 3.87 - 5.11 MIL/uL   Hemoglobin 14.2 12.0 - 15.0 g/dL   HCT 44.0 36.0 - 46.0 %   MCV 85.6 80.0 - 100.0 fL   MCH 27.6 26.0 - 34.0 pg   MCHC 32.3 30.0 - 36.0 g/dL   RDW 12.8 11.5 - 15.5 %   Platelets 473 (H) 150 - 400 K/uL   nRBC 0.0 0.0 - 0.2 %    Comment: Performed at [facility], [address] [place], [place] [address]  Lactic acid, plasma     Status: None   Collection Time: 05/06/21  1:33 PM  Result Value Ref Range   Lactic Acid, Venous 1.6 0.5 - 1.9 mmol/L    Comment: Performed at [facility], [address], [place], [place] [address]  Resp Panel by RT-PCR (Flu A&B, Covid) Nasopharyngeal Swab     Status: None   Collection Time: 05/06/21  1:33 PM   Specimen: Nasopharyngeal Swab; Nasopharyngeal(NP) swabs in vial transport medium  Result Value Ref Range   SARS Coronavirus 2 by RT PCR NEGATIVE NEGATIVE    Comment: (NOTE) SARS-CoV-2 target nucleic acids are NOT DETECTED.  The SARS-CoV-2 RNA is generally detectable in upper respiratory specimens during the acute phase of infection. The lowest concentration of SARS-CoV-2 viral copies this assay can detect is 138 copies/mL. A negative result does not preclude SARS-Cov-2 infection and should not be used as the sole basis for treatment or other patient management decisions. A negative result may occur with  improper specimen collection/handling, submission of specimen other than nasopharyngeal swab, presence of viral mutation(s) within the areas targeted by this assay, and inadequate number of viral copies(<138 copies/mL). A negative result must be combined with clinical observations, patient history, and epidemiological information. The expected result is  Negative.  Fact Sheet for Patients:  EntrepreneurPulse.com.au  Fact Sheet for Healthcare Providers:  IncredibleEmployment.be  This test is no t yet approved or cleared by the [place] FDA and  has been authorized for detection and/or diagnosis of SARS-CoV-2 by FDA under an Emergency Use Authorization (EUA). This EUA will remain  in effect (meaning this test can be used) for  the duration of the COVID-19 declaration under Section 564(b)(1) of the Act, 21 U.S.C.section 360bbb-3(b)(1), unless the authorization is terminated  or revoked sooner.       Influenza A by PCR NEGATIVE NEGATIVE   Influenza B by PCR NEGATIVE NEGATIVE    Comment: (NOTE) The Xpert Xpress SARS-CoV-2/FLU/RSV plus assay is intended as an aid in the diagnosis of influenza from Nasopharyngeal swab specimens and should not be used as a sole basis for treatment. Nasal washings and aspirates are unacceptable for Xpert Xpress SARS-CoV-2/FLU/RSV testing.  Fact Sheet for Patients: EntrepreneurPulse.com.au  Fact Sheet for Healthcare Providers: IncredibleEmployment.be  This test is not yet approved or cleared by the [place] FDA and has been authorized for detection and/or diagnosis of SARS-CoV-2 by FDA under an Emergency Use Authorization (EUA). This EUA will remain in effect (meaning this test can be used) for the duration of the COVID-19 declaration under Section 564(b)(1) of the Act, 21 U.S.C. section 360bbb-3(b)(1), unless the authorization is terminated or revoked.  Performed at [facility], [address] [place], [place] [address]   Type and screen [place]     Status: None   Collection Time: 05/06/21  1:33 PM  Result Value Ref Range   ABO/RH(D) B POS    Antibody Screen NEG    Sample Expiration      05/09/2021,2359 Performed at [facility], [address] [place], [place]  [address]   Urinalysis, Routine w reflex microscopic     Status: Abnormal   Collection Time: 05/06/21  1:36 PM  Result Value Ref Range   Color, Urine YELLOW YELLOW   APPearance CLEAR (A) CLEAR   Specific Gravity, Urine >1.030 (H) 1.005 - 1.030   pH 6.0 5.0 - 8.0   Glucose, UA NEGATIVE NEGATIVE mg/dL   Hgb urine dipstick MODERATE (A) NEGATIVE   Bilirubin Urine SMALL (A) NEGATIVE   Ketones, ur 40 (A) NEGATIVE mg/dL   Protein, ur 100 (A) NEGATIVE mg/dL   Nitrite NEGATIVE NEGATIVE   Leukocytes,Ua NEGATIVE NEGATIVE   RBC / HPF 0-5 0 - 5 RBC/hpf   WBC, UA 0-5 0 - 5 WBC/hpf   Bacteria, UA FEW (A) NONE SEEN   Squamous Epithelial / LPF 6-10 0 - 5   Mucus PRESENT     Comment: Performed at [facility], [address] [place], [place] [address]   CT ABDOMEN PELVIS W CONTRAST  Result Date: 05/06/2021 CLINICAL DATA:  Nausea/vomiting, abdominal pain EXAM: CT ABDOMEN AND PELVIS WITH CONTRAST TECHNIQUE: Multidetector CT imaging of the abdomen and pelvis was performed using the standard protocol following bolus administration of intravenous contrast. RADIATION DOSE REDUCTION: This exam was performed according to the departmental dose-optimization program which includes automated exposure control, adjustment of the mA and/or kV according to patient size and/or use of iterative reconstruction technique. CONTRAST:  149m OMNIPAQUE IOHEXOL 300 MG/ML  SOLN COMPARISON:  None. FINDINGS: Lower chest: Bibasilar atelectasis. Hepatobiliary: No focal liver abnormality is seen. No gallstones, gallbladder wall thickening, or biliary dilatation. Pancreas: Unremarkable. No pancreatic ductal dilatation or surrounding inflammatory changes. Spleen: Normal in size without focal abnormality. Adrenals/Urinary Tract: Adrenal glands are unremarkable. No hydronephrosis. Punctate nonobstructive left mid renal stone. The bladder is minimally distended. Stomach/Bowel: The stomach is moderately distended. There are multiple loops  of dilated small bowel with transition point in the pelvis (series 2, image 70). There is mesenteric edema. The appendix is normal. Vascular/Lymphatic: No abdominopelvic lymphadenopathy. No significant vascular findings are present. Reproductive:  Prior hysterectomy. Other: Scattered pneumoperitoneum. Small volume pelvic free fluid. There is mild peritoneal enhancement in the pelvis. There are multiple small collections of fluid notable in the left hemiabdomen measuring up to 2.4 cm short axis (series 2, image 70), anterior pelvis measuring up to 3.1 cm short axis (series 2, image 77), in the pelvis measuring up to 5.2 cm in width (series 2, image 86). Musculoskeletal: No acute or significant osseous findings. IMPRESSION: Small-bowel obstruction with transition point in the pelvis. Scattered pneumoperitoneum, which could be related to recent surgery, however given the bowel obstruction a small perforation cannot be excluded. Small volume abdominopelvic free fluid with ill-defined/non walled-off collections notable in the left hemiabdomen and pelvis. Mild peritoneal enhancement in the pelvis consistent with peritonitis. These results were called by telephone at the time of interpretation on 05/06/2021 at 2:56 pm to provider MARK QUALE , who verbally acknowledged these results. Electronically Signed   By: Maurine Simmering M.D.   On: 05/06/2021 15:01    Review of Systems  Constitutional:  Negative for chills and fever.  HENT:  Negative for congestion, hearing loss and sinus pain.   Respiratory:  Negative for cough, shortness of breath and wheezing.   Cardiovascular:  Negative for chest pain, palpitations and leg swelling.  Gastrointestinal:  Positive for abdominal distention, abdominal pain, nausea and vomiting. Negative for constipation and diarrhea.  Genitourinary:  Negative for dysuria, flank pain, frequency, hematuria and urgency.  Musculoskeletal:  Negative for back pain.  Skin:  Negative for rash.   Neurological:  Negative for dizziness and headaches.  Psychiatric/Behavioral:  Negative for suicidal ideas. The patient is not nervous/anxious.    Blood pressure 108/75, pulse (!) 122, temperature 98.5 F (36.9 C), temperature source Oral, resp. rate 16, height _0  (1.575 m), weight 90.7 kg, last menstrual period 04/21/2021, SpO2 96 %. Physical Exam Vitals and nursing note reviewed.  Constitutional:      Appearance: Normal appearance. She is well-developed.  HENT:     Head: Normocephalic and atraumatic.  Cardiovascular:     Rate and Rhythm: Normal rate and regular rhythm.  Pulmonary:     Effort: Pulmonary effort is normal.     Breath sounds: Normal breath sounds.  Abdominal:     General: There is distension.     Palpations: Abdomen is soft.     Tenderness: There is generalized abdominal tenderness.     Comments: Incisions are clean dry and intact. Bandages in place.   Musculoskeletal:        General: Normal range of motion.  Skin:    General: Skin is warm and dry.  Neurological:     Mental Status: She is alert and oriented to person, place, and time.  Psychiatric:        Behavior: Behavior normal.        Thought Content: Thought content normal.        Judgment: Judgment normal.     Assessment/Plan 46 yo with small bowel obstruction s/p hysterectomy POD # 2 SBO- NG tube placed, NPO, reglan, protonix Diabetes- sliding scale coverage ordered. Goal for healing to have glucose less than 180. IV fluids Pain control: Toradol and morphine PRN CHTN- continue Hyzaar Atelectasis- encourage incentive spirometry  Homero Fellers, MD 05/06/2021, 5:02 PM

## 2021-05-06 NOTE — Consult Note (Signed)
Patient ID: Donna Reyes, female   DOB: 12-10-1975, 46 y.o.   MRN: [id number]  HPI Donna Reyes is a 46 y.o. female seen in consultation at the request of Dr. Jacqualine Code for postoperative ileus.  She did have laparoscopic hysterectomy 2 days ago by Dr. Kenton Kingfisher.  It was uneventful.  There were some intra-abdominal adhesions. She has been having persistent nausea and vomiting started today.  This was associated with abdominal pain, the pain is intermittent colicky and moderate intensity.  She denies any fevers any chills.  She endorses some flatus. Scan personally reviewed there is evidence of dilation of the small bowel.  There is no evidence of large pneumoperitoneum to suggest bowel injury.    Lipase 171 (*)      All other components within normal limits  COMPREHENSIVE METABOLIC PANEL - Abnormal; Notable for the following components:    Sodium 133 (*)      Glucose, Bld 148 (*)      Calcium 8.6 (*)      Total Bilirubin 1.4 (*)      All other components within normal limits  CBC - Abnormal; Notable for the following components:    WBC 16.6 (*)      RBC 5.14 (*)      Platelets 473 (*)      HPI  Past Medical History:  Diagnosis Date   Allergy    Anemia    Anxiety 03/05/2019   Asthma    BRCA negative 10/2019   MyRisk neg; IBIS=7.8%   Diabetes mellitus without complication (HCC)    type 2   Family history of adverse reaction to anesthesia    Father - slow to wake   Family history of pancreatic cancer    GERD (gastroesophageal reflux disease)    Hypertension    Sleep apnea    Wears contact lenses     Past Surgical History:  Procedure Laterality Date   AXILLARY LYMPH NODE BIOPSY Right 05/25/2017   LYMPHADENITIS. NEGATIVE FOR MALIGNANCY.    BREAST BIOPSY Right 05/25/2017   neg/NEGATIVE FOR CARCINOMA. INFLAMMATION AND SQUAMOUS METAPLASIA OF A FOCAL DUCT    CESAREAN SECTION     2002,2006,2007   COLONOSCOPY WITH PROPOFOL N/A 06/23/2020   Procedure: COLONOSCOPY  WITH PROPOFOL;  Surgeon: Virgel Manifold, MD;  Location: [facility];  Service: Endoscopy;  Laterality: N/A;  Diabetic  sleep apnea priority 4   CYSTOSCOPY N/A 05/04/2021   Procedure: CYSTOSCOPY;  Surgeon: Gae Dry, MD;  Location: [facility];  Service: Gynecology;  Laterality: N/A;   TOTAL LAPAROSCOPIC HYSTERECTOMY WITH SALPINGECTOMY Bilateral 05/04/2021   Procedure: TOTAL LAPAROSCOPIC HYSTERECTOMY WITH BILATERAL SALPINGECTOMY;  Surgeon: Gae Dry, MD;  Location: [facility];  Service: Gynecology;  Laterality: Bilateral;   TUBAL LIGATION  2007    Family History  Problem Relation Age of Onset   Diabetes Mother    Heart failure Mother    Kidney disease Mother    Diabetes Maternal Grandmother    Breast cancer Maternal Grandmother 54   Hypertension Father    Hyperlipidemia Father    Atrial fibrillation Sister    ADD / ADHD Son    Heart attack Maternal Grandfather    Diabetes Paternal Grandmother    Hypertension Paternal Grandmother    Prostate cancer Paternal Grandfather    Colon cancer Paternal Grandfather 50   Polycystic ovary syndrome Sister    Polycystic ovary syndrome Sister    Pancreatic cancer Paternal Aunt 70  Ovarian cancer Neg Hx     Social History Social History   Tobacco Use   Smoking status: Never   Smokeless tobacco: Never  Vaping Use   Vaping Use: Never used  Substance Use Topics   Alcohol use: No   Drug use: No    Allergies  Allergen Reactions   Capsicum Shortness Of Breath    Jalapeno peppers   Basil Oil Swelling    Tongue swelling   Lisinopril     fatigue    No current facility-administered medications for this encounter.   Current Outpatient Medications  Medication Sig Dispense Refill   ondansetron (ZOFRAN-ODT) 4 MG disintegrating tablet Take 1 tablet (4 mg total) by mouth every 6 (six) hours as needed for nausea. 20 tablet 0   albuterol (PROVENTIL) (2.5 MG/3ML) 0.083% nebulizer solution Take 3 mLs (2.5 mg total) by  nebulization every 6 (six) hours as needed for wheezing or shortness of breath. (Patient taking differently: Take 2.5 mg by nebulization every 6 (six) hours as needed (Asthma).) 150 mL 1   albuterol (VENTOLIN HFA) 108 (90 Base) MCG/ACT inhaler Inhale 2 puffs into the lungs every 6 (six) hours as needed for wheezing or shortness of breath. May also use 2 puffs prior to exercise for EIB (Patient taking differently: Inhale 2 puffs into the lungs every 6 (six) hours as needed (Asthma).) 8 g 5   Cholecalciferol (VITAMIN D3) 125 MCG (5000 UT) CAPS Take 5,000 Units by mouth daily.     doxycycline (VIBRAMYCIN) 100 MG capsule Take 1 capsule (100 mg total) by mouth 2 (two) times daily. 20 capsule 0   losartan-hydrochlorothiazide (HYZAAR) 100-12.5 MG tablet TAKE 1 TABLET BY MOUTH EVERY DAY (STOP LOSARTAN) 90 tablet 3   metFORMIN (GLUCOPHAGE-XR) 500 MG 24 hr tablet Take 1 tablet (500 mg total) by mouth daily with breakfast. 90 tablet 3   oxyCODONE (ROXICODONE) 5 MG immediate release tablet Take 1 tablet (5 mg total) by mouth every 6 (six) hours as needed for severe pain. 20 tablet 0   pantoprazole (PROTONIX) 20 MG tablet Take 1 tablet (20 mg total) by mouth 2 (two) times daily. (Patient taking differently: Take 20 mg by mouth daily.) 90 tablet 1   rosuvastatin (CRESTOR) 5 MG tablet TAKE 1 TABLET BY MOUTH EVERYDAY AT BEDTIME 90 tablet 0   Semaglutide, 1 MG/DOSE, (OZEMPIC, 1 MG/DOSE,) 4 MG/3ML SOPN Inject 1 mg into the skin once a week. 1 ml; MONDAY     sucralfate (CARAFATE) 1 g tablet Take 1 g by mouth 4 (four) times daily.     SYMBICORT 160-4.5 MCG/ACT inhaler Inhale 2 puffs into the lungs in the morning and at bedtime. (Patient taking differently: Inhale 2 puffs into the lungs daily.) 10.2 g 11     Review of Systems Full ROS  was asked and was negative except for the information on the HPI  Physical Exam Blood pressure 108/75, pulse (!) 122, temperature 98.5 F (36.9 C), temperature source Oral, resp. rate  16, height '5\' 2"'  (1.575 m), weight 90.7 kg, last menstrual period 04/21/2021, SpO2 96 %. CONSTITUTIONAL: NAD. EYES: Pupils are equal, round, Sclera are non-icteric. EARS, NOSE, MOUTH AND THROAT: She is wearing a mask, Hearing is intact to voice. LYMPH NODES:  Lymph nodes in the neck are normal. RESPIRATORY:  Lungs are clear. There is normal respiratory effort, with equal breath sounds bilaterally, and without pathologic use of accessory muscles. CARDIOVASCULAR: Heart is regular without murmurs, gallops, or rubs. GI: The abdomen is  soft, distended and tympanic. Incisions covered w tegaderm. No obvious infection or bleeding.There are no palpable masses. There is no hepatosplenomegaly. There are decreased bowel sounds  GU: Rectal deferred.   MUSCULOSKELETAL: Normal muscle strength and tone. No cyanosis or edema.   SKIN: Turgor is good and there are no pathologic skin lesions or ulcers. NEUROLOGIC: Motor and sensation is grossly normal. Cranial nerves are grossly intact. PSYCH:  Oriented to person, place and time. Affect is normal.  Data Reviewed  I have personally reviewed the patient's imaging, laboratory findings and medical records.    Assessment/Plan 46 yo female with recent hysterectomy recent history consistent with ileus confirmed by CT scan.  No need for surgical intervention.  I have placed an order for an NG tube.  We will be happy to continue to follow her.,  And IV fluid bowel rest and serial abdominal exams.  I also placed an order for an x-ray in the morning. We will continue to follow.  D/W Dr. Gilman Schmidt in detail.  Caroleen Hamman, MD FACS General Surgeon 05/06/2021, 4:00 PM

## 2021-05-06 NOTE — ED Notes (Signed)
Advanced pt's NG 4cm due to x-ray results.

## 2021-05-07 ENCOUNTER — Encounter: Payer: Self-pay | Admitting: Obstetrics and Gynecology

## 2021-05-07 ENCOUNTER — Observation Stay: Payer: No Typology Code available for payment source

## 2021-05-07 DIAGNOSIS — N8003 Adenomyosis of the uterus: Secondary | ICD-10-CM | POA: Diagnosis present

## 2021-05-07 DIAGNOSIS — N939 Abnormal uterine and vaginal bleeding, unspecified: Secondary | ICD-10-CM | POA: Diagnosis present

## 2021-05-07 DIAGNOSIS — K567 Ileus, unspecified: Secondary | ICD-10-CM | POA: Diagnosis not present

## 2021-05-07 DIAGNOSIS — Z7951 Long term (current) use of inhaled steroids: Secondary | ICD-10-CM | POA: Diagnosis not present

## 2021-05-07 DIAGNOSIS — K9189 Other postprocedural complications and disorders of digestive system: Secondary | ICD-10-CM | POA: Diagnosis not present

## 2021-05-07 DIAGNOSIS — Z20822 Contact with and (suspected) exposure to covid-19: Secondary | ICD-10-CM | POA: Diagnosis present

## 2021-05-07 DIAGNOSIS — I10 Essential (primary) hypertension: Secondary | ICD-10-CM

## 2021-05-07 DIAGNOSIS — Z7984 Long term (current) use of oral hypoglycemic drugs: Secondary | ICD-10-CM | POA: Diagnosis not present

## 2021-05-07 DIAGNOSIS — R102 Pelvic and perineal pain: Secondary | ICD-10-CM | POA: Diagnosis present

## 2021-05-07 DIAGNOSIS — G4730 Sleep apnea, unspecified: Secondary | ICD-10-CM | POA: Diagnosis present

## 2021-05-07 DIAGNOSIS — G4733 Obstructive sleep apnea (adult) (pediatric): Secondary | ICD-10-CM | POA: Diagnosis present

## 2021-05-07 DIAGNOSIS — E11628 Type 2 diabetes mellitus with other skin complications: Secondary | ICD-10-CM | POA: Diagnosis present

## 2021-05-07 DIAGNOSIS — Z6836 Body mass index (BMI) 36.0-36.9, adult: Secondary | ICD-10-CM | POA: Diagnosis not present

## 2021-05-07 DIAGNOSIS — E669 Obesity, unspecified: Secondary | ICD-10-CM | POA: Diagnosis present

## 2021-05-07 DIAGNOSIS — Z90710 Acquired absence of both cervix and uterus: Secondary | ICD-10-CM | POA: Diagnosis not present

## 2021-05-07 DIAGNOSIS — F419 Anxiety disorder, unspecified: Secondary | ICD-10-CM | POA: Diagnosis present

## 2021-05-07 DIAGNOSIS — Z79899 Other long term (current) drug therapy: Secondary | ICD-10-CM | POA: Diagnosis not present

## 2021-05-07 DIAGNOSIS — J45909 Unspecified asthma, uncomplicated: Secondary | ICD-10-CM | POA: Diagnosis present

## 2021-05-07 DIAGNOSIS — K219 Gastro-esophageal reflux disease without esophagitis: Secondary | ICD-10-CM | POA: Diagnosis present

## 2021-05-07 DIAGNOSIS — Z833 Family history of diabetes mellitus: Secondary | ICD-10-CM | POA: Diagnosis not present

## 2021-05-07 DIAGNOSIS — K565 Intestinal adhesions [bands], unspecified as to partial versus complete obstruction: Secondary | ICD-10-CM | POA: Diagnosis present

## 2021-05-07 LAB — GLUCOSE, CAPILLARY
Glucose-Capillary: 139 mg/dL — ABNORMAL HIGH (ref 70–99)
Glucose-Capillary: 128 mg/dL — ABNORMAL HIGH (ref 70–99)
Glucose-Capillary: 99 mg/dL (ref 70–99)
Glucose-Capillary: 132 mg/dL — ABNORMAL HIGH (ref 70–99)
Glucose-Capillary: 128 mg/dL — ABNORMAL HIGH (ref 70–99)

## 2021-05-07 LAB — CBC
HCT: 40.3 % (ref 36.0–46.0)
Hemoglobin: 13.4 g/dL (ref 12.0–15.0)
MCH: 27.6 pg (ref 26.0–34.0)
MCHC: 33.3 g/dL (ref 30.0–36.0)
MCV: 83.1 fL (ref 80.0–100.0)
Platelets: 512 10*3/uL — ABNORMAL HIGH (ref 150–400)
RBC: 4.85 MIL/uL (ref 3.87–5.11)
RDW: 12.9 % (ref 11.5–15.5)
WBC: 15 10*3/uL — ABNORMAL HIGH (ref 4.0–10.5)
nRBC: 0 % (ref 0.0–0.2)

## 2021-05-07 LAB — HEMOGLOBIN A1C
Hgb A1c MFr Bld: 6 % — ABNORMAL HIGH (ref 4.8–5.6)
Mean Plasma Glucose: 125.5 mg/dL

## 2021-05-07 MED ORDER — INSULIN ASPART 100 UNIT/ML IJ SOLN
INTRAMUSCULAR | Status: AC
  Filled 2021-05-07: qty 1

## 2021-05-07 MED ORDER — INSULIN ASPART 100 UNIT/ML IJ SOLN
0.0000 [IU] | INTRAMUSCULAR | Status: DC
  Administered 2021-05-07 – 2021-05-08 (×4): 2 [IU] via SUBCUTANEOUS
  Administered 2021-05-08: 4 [IU] via SUBCUTANEOUS
  Administered 2021-05-10 – 2021-05-11 (×3): 2 [IU] via SUBCUTANEOUS
  Filled 2021-05-07 (×8): qty 1

## 2021-05-07 MED ORDER — LABETALOL HCL 200 MG PO TABS
200.0000 mg | ORAL_TABLET | Freq: Two times a day (BID) | ORAL | Status: DC
  Administered 2021-05-07 (×2): 200 mg via ORAL
  Filled 2021-05-07 (×2): qty 1

## 2021-05-07 MED ORDER — ALUM & MAG HYDROXIDE-SIMETH 200-200-20 MG/5ML PO SUSP
30.0000 mL | Freq: Four times a day (QID) | ORAL | Status: DC | PRN
  Administered 2021-05-07: 30 mL via ORAL
  Filled 2021-05-07: qty 30

## 2021-05-07 MED ORDER — HYDROCHLOROTHIAZIDE 12.5 MG PO TABS
12.5000 mg | ORAL_TABLET | Freq: Every day | ORAL | Status: DC
  Administered 2021-05-07 – 2021-05-11 (×4): 12.5 mg
  Filled 2021-05-07 (×5): qty 1

## 2021-05-07 MED ORDER — KETOROLAC TROMETHAMINE 30 MG/ML IJ SOLN
15.0000 mg | Freq: Four times a day (QID) | INTRAMUSCULAR | Status: DC
  Administered 2021-05-08: 15 mg via INTRAMUSCULAR

## 2021-05-07 MED ORDER — KETOROLAC TROMETHAMINE 30 MG/ML IJ SOLN
15.0000 mg | Freq: Four times a day (QID) | INTRAMUSCULAR | Status: DC
  Administered 2021-05-07 – 2021-05-11 (×16): 15 mg via INTRAVENOUS
  Filled 2021-05-07 (×17): qty 1

## 2021-05-07 MED ORDER — LOSARTAN POTASSIUM 50 MG PO TABS
100.0000 mg | ORAL_TABLET | Freq: Every day | ORAL | Status: DC
  Administered 2021-05-07 – 2021-05-11 (×4): 100 mg
  Filled 2021-05-07 (×5): qty 2

## 2021-05-07 MED ORDER — METOCLOPRAMIDE HCL 5 MG/ML IJ SOLN
10.0000 mg | Freq: Four times a day (QID) | INTRAMUSCULAR | Status: DC
  Administered 2021-05-07 – 2021-05-11 (×16): 10 mg via INTRAVENOUS
  Filled 2021-05-07 (×17): qty 2

## 2021-05-07 MED ORDER — BACITRACIN-NEOMYCIN-POLYMYXIN OINTMENT TUBE
TOPICAL_OINTMENT | Freq: Two times a day (BID) | CUTANEOUS | Status: DC
  Filled 2021-05-07: qty 14.17

## 2021-05-07 MED ORDER — ONDANSETRON HCL 4 MG/2ML IJ SOLN
4.0000 mg | INTRAMUSCULAR | Status: DC | PRN
  Administered 2021-05-08 – 2021-05-10 (×5): 4 mg via INTRAVENOUS
  Filled 2021-05-07 (×5): qty 2

## 2021-05-07 NOTE — Progress Notes (Signed)
Gynecology Progress Note  Admission Date: 05/06/2021 Current Date: 05/07/2021  Donna Reyes is a 46 y.o. [id number] HD#1 She is s/p TLH BS Cysto on 05/04/21. She was re-admitted last night for nausea and vomiting and abdominal pain with a diagnosis of ileus vs small bowel obstruction She reports feeling better with NG tube and was able to sleep last night some. Still having some abd distension and pain. Desires ice chips. Incisions not bothering her. Arm wounds (with stitches from injury last week) healing well.   History complicated by: Patient Active Problem List   Diagnosis Date Noted   Small bowel obstruction ([place]) 05/06/2021   Pelvic pain    Abdominal pain 02/08/2021   Leiomyoma 01/07/2021   Abnormal uterine bleeding (AUB) 01/07/2021   Trichomoniasis 01/07/2021   BV (bacterial vaginosis) 01/07/2021   Iron deficiency 12/08/2020   Seasonal allergies 07/22/2020   Class 2 severe obesity with serious comorbidity and body mass index (BMI) of 37.0 to 37.9 in adult [facility]) 03/10/2020   Hyperlipidemia associated with type 2 diabetes mellitus ([place]) 03/10/2020   Family history of pancreatic cancer 10/10/2019   Right ovarian cyst 10/10/2019   Controlled type 2 diabetes mellitus with skin complication ([place]) 25/85/2778   Menorrhagia with regular cycle 08/14/2018   Lymphadenopathy, axillary 10/27/2017   Essential hypertension 01/06/2016   Mild intermittent asthma with acute exacerbation 01/06/2016   Iron deficiency anemia 12/30/2015   ROS and patient/family/surgical history, located on admission H&P note dated 05/06/2021, have been reviewed, and there are no changes except as noted below  Objective:   Temp:  [98.3 F (36.8 C)-99.1 F (37.3 C)] 98.4 F (36.9 C) (02/03 0320) Pulse Rate:  [111-154] 120 (02/03 0600) Resp:  [9-18] 12 (02/03 0600) BP: (95-142)/(69-112) 121/69 (02/03 0328) SpO2:  [88 %-98 %] 94 % (02/03 0600) Weight:  [90.7 kg] 90.7 kg (02/02 1303) I/O last 3  completed shifts: In: 350 [I.V.:350] Out: 700 [Urine:500; Emesis/NG output:200] No intake/output data recorded.  Intake/Output Summary (Last 24 hours) at 05/07/2021 0751 Last data filed at 05/07/2021 0640 Gross per 24 hour  Intake 350 ml  Output 700 ml  Net -350 ml   She has had 200 mL NG OUTPUT since 0200. Voids without foley. Able to ambulate  Physical exam: General appearance: alert, cooperative, and appears stated age Abdomen:  mildly distended, no BS, min T, inc healing well GU: No gross VB Lungs: clear to auscultation bilaterally Heart: regular rate and rhythm and no MRGs Extremities: no redness or tenderness in the calves or thighs, no edema Skin: no lesions Psych: appropriate  Recent Labs  Lab 05/06/21 1333  NA 133*  K 3.8  CL 99  CO2 26  BUN 13  CREATININE 0.87  GLUCOSE 148*   Recent Labs  Lab 05/06/21 1333  WBC 16.6*  HGB 14.2  HCT 44.0  PLT 473*    Recent Labs  Lab 05/06/21 1333  CALCIUM 8.6*   No results for input(s): INR, APTT in the last 168 hours.    Recent Labs  Lab 05/06/21 1333  ALKPHOS 62  BILITOT 1.4*  PROT 7.7  ALT 12  AST 17    Recent Labs  Lab 05/06/21 1333  WBC 16.6*  HGB 14.2  HCT 44.0  PLT 473*   Recent Labs  Lab 05/06/21 1333  NA 133*  K 3.8  CL 99  CO2 26  BUN 13  CREATININE 0.87  CALCIUM 8.6*  PROT 7.7  BILITOT 1.4*  ALKPHOS 62  ALT 12  AST 17  GLUCOSE 148*    Radiology See CT and XRAY reports  Assessment & Plan:  S/p hysterectomy, no incisional or vaginal concerns Post op ileus.  Appreciate General Surgery input and management. Cont NG tube as needed per gen surgery protocols NPO currently 3.    Ambulate prn 4.    Insulin prn for blood sugars 5.   OSA, O2 as needed while sleeping 6.   HTN, cont meds although BP have been low this admission/ overnight  Barnett Applebaum, MD, [facility] 05/07/2021  7:55 AM

## 2021-05-07 NOTE — Anesthesia Postprocedure Evaluation (Signed)
Anesthesia Post Note  Patient: Donna Reyes  Procedure(s) Performed: TOTAL LAPAROSCOPIC HYSTERECTOMY WITH BILATERAL SALPINGECTOMY (Bilateral: Abdomen) CYSTOSCOPY  Patient location during evaluation: PACU Anesthesia Type: General Level of consciousness: awake and awake and alert Pain management: pain level controlled Vital Signs Assessment: post-procedure vital signs reviewed and stable Respiratory status: spontaneous breathing and nonlabored ventilation Cardiovascular status: stable Anesthetic complications: no   No notable events documented.   Last Vitals:  Vitals:   05/04/21 1500 05/04/21 1545  BP: 110/79 125/76  Pulse: (!) 107 88  Resp: 16 16  Temp:  36.6 C  SpO2: 100% 94%    Last Pain:  Vitals:   05/05/21 0838  TempSrc:   PainSc: 3                  VAN STAVEREN,Shariff Lasky

## 2021-05-07 NOTE — Progress Notes (Addendum)
[facility] SURGICAL PROGRESS NOTE (cpt [phone])  Hospital Day(s): 0.   Interval History: Patient seen and examined, no acute events or new complaints overnight. Patient reports she is feeling better this morning. She still has some distension but overall her abdomen is reportedly improved compared to presentation. She denies fever, chills, nausea, emesis. No new labs this morning. KUB still with dilated loops of bowel but there is no gastric distension and air scattered throughout the colon. NGT has had 150 ccs out since 0600; this is very dilute consistent with stomach fluid. She has started to pass flatus this morning   Review of Systems:  Constitutional: denies fever, chills  HEENT: denies cough or congestion  Respiratory: denies any shortness of breath  Cardiovascular: denies chest pain or palpitations  Gastrointestinal: + distension (improved), + abdominal pain (incisional soreness), denied N/V Genitourinary: denies burning with urination or urinary frequency Musculoskeletal: denies pain, decreased motor or sensation  Vital signs in last 24 hours: [min-max] current  Temp:  [98.3 F (36.8 C)-99.1 F (37.3 C)] 98.4 F (36.9 C) (02/03 0320) Pulse Rate:  [111-154] 120 (02/03 0600) Resp:  [9-18] 12 (02/03 0600) BP: (95-142)/(69-112) 121/69 (02/03 0328) SpO2:  [88 %-98 %] 94 % (02/03 0600) Weight:  [90.7 kg] 90.7 kg (02/02 1303)     Height: 5\' 2"  (157.5 cm) Weight: 90.7 kg BMI (Calculated): 36.57   Intake/Output last 2 shifts:  02/02 0701 - 02/03 0700 In: 350 [I.V.:350] Out: 700 [Urine:500; Emesis/NG output:200]   Physical Exam:  Constitutional: alert, cooperative and no distress  HENT: normocephalic without obvious abnormality; NGT in place; output clear Eyes: PERRL, EOM's grossly intact and symmetric  Respiratory: breathing non-labored at rest  Cardiovascular: regular rate and sinus rhythm  Gastrointestinal: soft, expected incisional soreness, mild distension  and tympany with percussion, no rebound/guarding Integumentary: Laparoscopic incisions are CDI with dermabond, no erythema    Labs:  CBC Latest Ref Rng & Units 05/06/2021 02/08/2021 11/27/2020  WBC 4.0 - 10.5 K/uL 16.6(H) 4.6 4.7  Hemoglobin 12.0 - 15.0 g/dL 14.2 13.9 14.5  Hematocrit 36.0 - 46.0 % 44.0 43.2 43.3  Platelets 150 - 400 K/uL 473(H) 390 389   CMP Latest Ref Rng & Units 05/06/2021 02/08/2021 11/10/2020  Glucose 70 - 99 mg/dL 148(H) 84 100  BUN 6 - 20 mg/dL 13 14 13   Creatinine 0.44 - 1.00 mg/dL 0.87 0.78 0.70  Sodium 135 - 145 mmol/L 133(L) 138 137  Potassium 3.5 - 5.1 mmol/L 3.8 4.2 3.6  Chloride 98 - 111 mmol/L 99 103 102  CO2 22 - 32 mmol/L 26 27 28   Calcium 8.9 - 10.3 mg/dL 8.6(L) 9.8 9.3  Total Protein 6.5 - 8.1 g/dL 7.7 7.5 6.9  Total Bilirubin 0.3 - 1.2 mg/dL 1.4(H) 0.5 0.4  Alkaline Phos 38 - 126 U/L 62 - -  AST 15 - 41 U/L 17 12 11   ALT 0 - 44 U/L 12 10 12      Imaging studies:   KUB + CXR (05/07/2021) personally reviewed which shows similar appearing small bowel distension but no longer with gastric distension and there is air scattered throughout the colon, no free air, and radiologist report reviewed below:  IMPRESSION: 1. Unchanged bowel-gas pattern compatible with small bowel obstruction or ileus. No free air. 2. Satisfactory enteric tube placement in the stomach. 3. Low lung volumes with bibasilar atelectasis.   Assessment/Plan: (ICD-10's: K38.609) 46 y.o. female with clinically improving post-operative ileus 3 days s/p laparoscopic hysterectomy    - She  has started to pass flatus this morning and NGT output is low and appears to be gastric fluid. She does have similar small bowel dilation on KUB this morning but there is air in the colon and images can lag behind her improving clinical picture. As such, I do think we can at least proceed with NGT clamping trial this morning. I clamped NGT at 0915. I will write to reconnect NGT to suction around 1300. If  output is <150 ccs, we can remove NGT and trial on CLD. If at anytime she develops nausea, emesis, or worsening distension while NGT is clamped, recommend returning to LIS.     - for now continue NPO + IVF support   - No indication for surgical intervention at this time  - Monitor abdominal examination; on-going bowel function   - Pain control prn (limit narcotics as much as feasible); antiemetics prn - Mobilization encouraged; OOB as tolerated and encouraged  - Further management per OB/GYN service; we will be happy to follow along   All of the above findings and recommendations were discussed with the patient, patient's family at bedside, and the medical team, and all of patient's and family's questions were answered to their expressed satisfaction.  -- Edison Simon, PA-C [facility] 05/07/2021, 7:15 AM [phone] M-F: 7am - 4pm

## 2021-05-07 NOTE — Telephone Encounter (Signed)
Gerald Stabs from [place] calling to get the status of STD form faxed 05/05/21; ref # (sounded [id number]; no need to call unless form not received; no need to call if form is faxed back.  [phone]

## 2021-05-07 NOTE — Telephone Encounter (Signed)
Yes, patient needs to pay and fill out forms

## 2021-05-08 ENCOUNTER — Inpatient Hospital Stay: Payer: No Typology Code available for payment source

## 2021-05-08 DIAGNOSIS — K567 Ileus, unspecified: Secondary | ICD-10-CM

## 2021-05-08 LAB — GLUCOSE, CAPILLARY
Glucose-Capillary: 106 mg/dL — ABNORMAL HIGH (ref 70–99)
Glucose-Capillary: 107 mg/dL — ABNORMAL HIGH (ref 70–99)
Glucose-Capillary: 111 mg/dL — ABNORMAL HIGH (ref 70–99)
Glucose-Capillary: 118 mg/dL — ABNORMAL HIGH (ref 70–99)
Glucose-Capillary: 134 mg/dL — ABNORMAL HIGH (ref 70–99)
Glucose-Capillary: 140 mg/dL — ABNORMAL HIGH (ref 70–99)
Glucose-Capillary: 90 mg/dL (ref 70–99)

## 2021-05-08 MED ORDER — SODIUM CHLORIDE 0.9 % IV SOLN
25.0000 mg | Freq: Four times a day (QID) | INTRAVENOUS | Status: DC | PRN
  Administered 2021-05-08: 25 mg via INTRAVENOUS
  Filled 2021-05-08: qty 1

## 2021-05-08 NOTE — Progress Notes (Signed)
Benign Gynecology Progress Note  Admission Date: 05/06/2021 Current Date: 05/08/2021  Donna Reyes is a 46 y.o. [id number] HD#2 .  History complicated by: Patient Active Problem List   Diagnosis Date Noted   Small bowel obstruction ([place]) 05/06/2021   Pelvic pain    Abdominal pain 02/08/2021   Leiomyoma 01/07/2021   Abnormal uterine bleeding (AUB) 01/07/2021   Trichomoniasis 01/07/2021   BV (bacterial vaginosis) 01/07/2021   Iron deficiency 12/08/2020   Seasonal allergies 07/22/2020   Class 2 severe obesity with serious comorbidity and body mass index (BMI) of 37.0 to 37.9 in adult [facility]) 03/10/2020   Hyperlipidemia associated with type 2 diabetes mellitus ([place]) 03/10/2020   Family history of pancreatic cancer 10/10/2019   Right ovarian cyst 10/10/2019   Controlled type 2 diabetes mellitus with skin complication ([place]) 86/76/1950   Menorrhagia with regular cycle 08/14/2018   Lymphadenopathy, axillary 10/27/2017   Essential hypertension 01/06/2016   Mild intermittent asthma with acute exacerbation 01/06/2016   Iron deficiency anemia 12/30/2015   ROS and patient/family/surgical history, located on admission H&P note dated 05/06/2021, have been reviewed, and there are no changes except as noted below  Yesterday/Overnight Events:  Gen surgery on consult, pt has NG tube XRAY this am  Subjective:  Pt describes less pain in abdomen today compared to yesterday She has ambulated She reports flatus regularly She had trial of NG clamping yesterday but did develop nausea, and it was returned to Low Intermittent Suction  Objective:   Temp:  [97.8 F (36.6 C)-98.9 F (37.2 C)] 98.7 F (37.1 C) (02/04 0736) Pulse Rate:  [93-127] 105 (02/04 0736) Resp:  [12-20] 20 (02/04 0736) BP: (99-124)/(59-88) 107/59 (02/04 0736) SpO2:  [79 %-100 %] 100 % (02/04 0736) I/O last 3 completed shifts: In: 2521.2 [P.O.:120; I.V.:2101.2; NG/GT:300] Out: 1550 [Urine:800; Emesis/NG output:750] No  intake/output data recorded.  Intake/Output Summary (Last 24 hours) at 05/08/2021 0739 Last data filed at 05/08/2021 0601 Gross per 24 hour  Intake 2171.18 ml  Output 850 ml  Net 1321.18 ml   Physical exam: General appearance: alert, cooperative, and appears stated age Abdomen:  soft, mildly tender to palpation, min distension, hypoactive but present BS GU: No gross VB Lungs: clear to auscultation bilaterally Heart: regular rate and rhythm and no MRGs Extremities: no redness or tenderness in the calves or thighs, no edema Skin: no lesions Psych: appropriate  Recent Labs  Lab 05/06/21 1333  NA 133*  K 3.8  CL 99  CO2 26  BUN 13  CREATININE 0.87  GLUCOSE 148*   Recent Labs  Lab 05/06/21 1333 05/07/21 0749  WBC 16.6* 15.0*  HGB 14.2 13.4  HCT 44.0 40.3  PLT 473* 512*    Recent Labs  Lab 05/06/21 1333  CALCIUM 8.6*   No results for input(s): INR, APTT in the last 168 hours.    Recent Labs  Lab 05/06/21 1333  ALKPHOS 62  BILITOT 1.4*  PROT 7.7  ALT 12  AST 17    Recent Labs  Lab 05/06/21 1333 05/07/21 0749  WBC 16.6* 15.0*  HGB 14.2 13.4  HCT 44.0 40.3  PLT 473* 512*   Recent Labs  Lab 05/06/21 1333  NA 133*  K 3.8  CL 99  CO2 26  BUN 13  CREATININE 0.87  CALCIUM 8.6*  PROT 7.7  BILITOT 1.4*  ALKPHOS 62  ALT 12  AST 17  GLUCOSE 148*   Assessment & Plan:   Problem List Items Addressed This Visit  SBO (small bowel obstruction) (HCC)       vs   Ileus following gastrointestinal surgery ([place])         NG management per gen surgery  No incisional or vaginal (bleeding) concerns Ambulate as often as she can HTN and DM management stable Discharge planning based on status of eventual NG removal and diet tolerence  Barnett Applebaum, MD, [facility] 05/08/2021  7:42 AM
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
## 2021-05-08 NOTE — Progress Notes (Signed)
Patient ID: Donna Reyes, female   DOB: 05/08/1975, 46 y.o.   MRN: [id number]     [place] [facility] Day(s): 1.   Interval History: Patient seen and examined, no acute events or new complaints overnight. Patient reports feeling bloated and having a lot of burping.  Feeling better and comfortable.  Exertion and passing gas.  No significant abdominal pain but uncomfortable.  Vital signs in last 24 hours: [min-max] current  Temp:  [98.3 F (36.8 C)-98.9 F (37.2 C)] 98.7 F (37.1 C) (02/04 0736) Pulse Rate:  [91-119] 91 (02/04 0900) Resp:  [10-20] 14 (02/04 0900) BP: (99-107)/(59-73) 107/59 (02/04 0736) SpO2:  [79 %-100 %] 94 % (02/04 0900)     Height: 5\' 2"  (157.5 cm) Weight: 90.7 kg BMI (Calculated): 36.57   Physical Exam:  Constitutional: alert, cooperative and no distress  Respiratory: breathing non-labored at rest  Cardiovascular: regular rate and sinus rhythm  Gastrointestinal: non-tender, but distended  Labs:  CBC Latest Ref Rng & Units 05/07/2021 05/06/2021 02/08/2021  WBC 4.0 - 10.5 K/uL 15.0(H) 16.6(H) 4.6  Hemoglobin 12.0 - 15.0 g/dL 13.4 14.2 13.9  Hematocrit 36.0 - 46.0 % 40.3 44.0 43.2  Platelets 150 - 400 K/uL 512(H) 473(H) 390   CMP Latest Ref Rng & Units 05/06/2021 02/08/2021 11/10/2020  Glucose 70 - 99 mg/dL 148(H) 84 100  BUN 6 - 20 mg/dL 13 14 13   Creatinine 0.44 - 1.00 mg/dL 0.87 0.78 0.70  Sodium 135 - 145 mmol/L 133(L) 138 137  Potassium 3.5 - 5.1 mmol/L 3.8 4.2 3.6  Chloride 98 - 111 mmol/L 99 103 102  CO2 22 - 32 mmol/L 26 27 28   Calcium 8.9 - 10.3 mg/dL 8.6(L) 9.8 9.3  Total Protein 6.5 - 8.1 g/dL 7.7 7.5 6.9  Total Bilirubin 0.3 - 1.2 mg/dL 1.4(H) 0.5 0.4  Alkaline Phos 38 - 126 U/L 62 - -  AST 15 - 41 U/L 17 12 11   ALT 0 - 44 U/L 12 10 12     Imaging studies: No new pertinent imaging studies   Assessment/Plan:  46 y.o. female with prolonged postoperative ileus s/p laparoscopic hysterectomy on 05/04/2021.  Patient today continue  with clinical sign of ileus.  Abdominal x-ray shows persistent small bowel dilation.  Physical exam with tympanic abdomen and distended.  No acute abdomen.  No need of any surgical intervention at this moment.  We will continue with plan of NGT to low intermittent suction, continue IV hydration, continue pain management.  I will keep her n.p.o. since she did not tolerated clear liquids.  I encouraged the patient to ambulate.   Arnold Long, MD

## 2021-05-08 NOTE — Progress Notes (Addendum)
Pt Off unit to xray

## 2021-05-09 LAB — BASIC METABOLIC PANEL
Anion gap: 10 (ref 5–15)
BUN: 21 mg/dL — ABNORMAL HIGH (ref 6–20)
CO2: 28 mmol/L (ref 22–32)
Calcium: 8 mg/dL — ABNORMAL LOW (ref 8.9–10.3)
Chloride: 105 mmol/L (ref 98–111)
Creatinine, Ser: 0.89 mg/dL (ref 0.44–1.00)
GFR, Estimated: >60 mL/min (ref >60)
Glucose, Bld: 103 mg/dL — ABNORMAL HIGH (ref 70–99)
Potassium: 3.5 mmol/L (ref 3.5–5.1)
Sodium: 143 mmol/L (ref 135–145)

## 2021-05-09 LAB — MAGNESIUM: Magnesium: 2.8 mg/dL — ABNORMAL HIGH (ref 1.7–2.4)

## 2021-05-09 LAB — GLUCOSE, CAPILLARY
Glucose-Capillary: 112 mg/dL — ABNORMAL HIGH (ref 70–99)
Glucose-Capillary: 100 mg/dL — ABNORMAL HIGH (ref 70–99)
Glucose-Capillary: 91 mg/dL (ref 70–99)
Glucose-Capillary: 92 mg/dL (ref 70–99)
Glucose-Capillary: 99 mg/dL (ref 70–99)

## 2021-05-09 LAB — PHOSPHORUS: Phosphorus: 2.6 mg/dL (ref 2.5–4.6)

## 2021-05-09 NOTE — Progress Notes (Signed)
Patient ID: Donna Reyes, female   DOB: Dec 09, 1975, 46 y.o.   MRN: [id number]     [place] [facility] Day(s): 2.   Interval History: Patient seen and examined, no acute events or new complaints overnight. Patient reports she feels a little more comfortable today.  Still feeling distended even though she states that she is passing gas.  She had a rough day yesterday that even with NGT to suction she was feeling nauseated and vomited twice.  Today denies any more nausea or vomiting.  Mild pain generalized.  No pain radiation.  No alleviating or aggravating factors.  Vital signs in last 24 hours: [min-max] current  Temp:  [98 F (36.7 C)-99 F (37.2 C)] 98 F (36.7 C) (02/05 0741) Pulse Rate:  [77-107] 97 (02/05 0910) Resp:  [14-20] 16 (02/05 0741) BP: (99-137)/(61-81) 137/81 (02/05 0741) SpO2:  [93 %-100 %] 95 % (02/05 0910)     Height: 5\' 2"  (157.5 cm) Weight: 90.7 kg BMI (Calculated): 36.57   Physical Exam:  Constitutional: alert, cooperative and no distress  Respiratory: breathing non-labored at rest  Cardiovascular: regular rate and sinus rhythm  Gastrointestinal: soft, non-tender, and distended  Labs:  CBC Latest Ref Rng & Units 05/07/2021 05/06/2021 02/08/2021  WBC 4.0 - 10.5 K/uL 15.0(H) 16.6(H) 4.6  Hemoglobin 12.0 - 15.0 g/dL 13.4 14.2 13.9  Hematocrit 36.0 - 46.0 % 40.3 44.0 43.2  Platelets 150 - 400 K/uL 512(H) 473(H) 390   CMP Latest Ref Rng & Units 05/06/2021 02/08/2021 11/10/2020  Glucose 70 - 99 mg/dL 148(H) 84 100  BUN 6 - 20 mg/dL 13 14 13   Creatinine 0.44 - 1.00 mg/dL 0.87 0.78 0.70  Sodium 135 - 145 mmol/L 133(L) 138 137  Potassium 3.5 - 5.1 mmol/L 3.8 4.2 3.6  Chloride 98 - 111 mmol/L 99 103 102  CO2 22 - 32 mmol/L 26 27 28   Calcium 8.9 - 10.3 mg/dL 8.6(L) 9.8 9.3  Total Protein 6.5 - 8.1 g/dL 7.7 7.5 6.9  Total Bilirubin 0.3 - 1.2 mg/dL 1.4(H) 0.5 0.4  Alkaline Phos 38 - 126 U/L 62 - -  AST 15 - 41 U/L 17 12 11   ALT 0 - 44 U/L 12 10 12      Imaging studies: No new pertinent imaging studies   Assessment/Plan:  46 y.o. female with prolonged postoperative ileus s/p laparoscopic hysterectomy on 05/04/2021.   She is slightly doing better this morning.  She is more comfortable being able to get out of bed and sitting in chair.  She still bleed tympanic abdomen and distended.  Also NGT with 650 mL in the last 24 hours which was unchanged from the day before.  She had a rough day yesterday with nausea and vomiting despite having the NGT to suction.  Since she failed clear liquid diet trial and NGT clamped 2 days ago I will not try this again today.  If she continue passing gas and abdominal distention decreased tomorrow we will try clamping the NGT tomorrow.  She is passing gas which is a good sign that this should start to get resolving soon.  I would recheck electrolytes just to make sure that there is no electrolyte disturbance that is delaying the recovery of the ileus.  I will let patient get some ice chips today.  I encouraged the patient to ambulate.  Arnold Long, MD

## 2021-05-09 NOTE — TOC Progression Note (Signed)
Transition of Care [facility]) - Progression Note    Patient Details  Name: Donna Reyes MRN: [id number] Date of Birth: 1976/01/09  Transition of Care [facility]) CM/SW [place], [place] Phone Number: 05/09/2021, 11:55 AM  Clinical Narrative:    CSW spoke to patient to follow up and see how she is doing. Patient reported feeling safe to go home with plans to change the locks on her doors. No current TOC needs.       Expected Discharge Plan and Services                                                 Social Determinants of Health (SDOH) Interventions    Readmission Risk Interventions No flowsheet data found.

## 2021-05-09 NOTE — Progress Notes (Signed)
Subjective:    Pt feels better today.  Says she is passing more gas. Pain controlled with Toradol alone.  Objective:    Patient Vitals for the past 2 hrs:  BP Temp Temp src Pulse Resp SpO2  05/09/21 0910 -- -- -- -- -- 95 %  05/09/21 0741 137/81 98 F (36.7 C) Oral 97 16 99 %   Total I/O In: -  Out: 200 [Urine:200]  Labs: Results for orders placed or performed during the hospital encounter of 05/06/21 (from the past 24 hour(s))  Glucose, capillary     Status: Abnormal   Collection Time: 05/08/21 11:32 AM  Result Value Ref Range   Glucose-Capillary 111 (H) 70 - 99 mg/dL  Glucose, capillary     Status: None   Collection Time: 05/08/21  4:41 PM  Result Value Ref Range   Glucose-Capillary 90 70 - 99 mg/dL   Comment 1 Notify RN   Glucose, capillary     Status: Abnormal   Collection Time: 05/08/21  8:16 PM  Result Value Ref Range   Glucose-Capillary 107 (H) 70 - 99 mg/dL  Glucose, capillary     Status: Abnormal   Collection Time: 05/08/21 11:39 PM  Result Value Ref Range   Glucose-Capillary 106 (H) 70 - 99 mg/dL  Glucose, capillary     Status: Abnormal   Collection Time: 05/09/21  5:07 AM  Result Value Ref Range   Glucose-Capillary 112 (H) 70 - 99 mg/dL  Glucose, capillary     Status: Abnormal   Collection Time: 05/09/21  7:39 AM  Result Value Ref Range   Glucose-Capillary 100 (H) 70 - 99 mg/dL   Comment 1 Notify RN     Medications    Current Discharge Medication List     CONTINUE these medications which have NOT CHANGED   Details  Cholecalciferol (VITAMIN D3) 125 MCG (5000 UT) CAPS Take 5,000 Units by mouth daily.    doxycycline (VIBRAMYCIN) 100 MG capsule Take 1 capsule (100 mg total) by mouth 2 (two) times daily. Qty: 20 capsule, Refills: 0    losartan-hydrochlorothiazide (HYZAAR) 100-12.5 MG tablet TAKE 1 TABLET BY MOUTH EVERY DAY (STOP LOSARTAN) Qty: 90 tablet, Refills: 3   Associated Diagnoses: Essential hypertension    metFORMIN (GLUCOPHAGE-XR) 500  MG 24 hr tablet Take 1 tablet (500 mg total) by mouth daily with breakfast. 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 Qty: 20 tablet, Refills: 0    oxyCODONE (ROXICODONE) 5 MG immediate release tablet Take 1 tablet (5 mg total) by mouth every 6 (six) hours as needed for severe pain. Qty: 20 tablet, Refills: 0   Associated Diagnoses: S/P hysterectomy    pantoprazole (PROTONIX) 20 MG tablet Take 1 tablet (20 mg total) by mouth 2 (two) times daily. Qty: 90 tablet, Refills: 1   Associated Diagnoses: Chronic gastric ulcer without hemorrhage and without perforation; Gastroesophageal reflux disease without esophagitis    rosuvastatin (CRESTOR) 5 MG tablet TAKE 1 TABLET BY MOUTH EVERYDAY AT BEDTIME Qty: 90 tablet, Refills: 0   Comments: Pt due for BW at 2/23 visit- no refills until see in office/due blood work Associated Diagnoses: Hyperlipidemia associated with type 2 diabetes mellitus (HCC)    SYMBICORT 160-4.5 MCG/ACT inhaler Inhale 2 puffs  into the lungs in the morning and at bedtime. Qty: 10.2 g, Refills: 11   Comments: Pt. Does not tolerate powdered inhalers    albuterol (PROVENTIL) (2.5 MG/3ML) 0.083% nebulizer solution Take 3 mLs (2.5 mg total) by nebulization every 6 (six) hours as needed for wheezing or shortness of breath. Qty: 150 mL, Refills: 1    albuterol (VENTOLIN HFA) 108 (90 Base) MCG/ACT inhaler Inhale 2 puffs into the lungs every 6 (six) hours as needed for wheezing or shortness of breath. May also use 2 puffs prior to exercise for EIB Qty: 8 g, Refills: 5   Associated Diagnoses: Persistent asthma without complication, unspecified asthma severity    Semaglutide, 1 MG/DOSE, (OZEMPIC, 1 MG/DOSE,) 4 MG/3ML SOPN Inject 1 mg  into the skin once a week. 1 ml; MONDAY    sucralfate (CARAFATE) 1 g tablet Take 1 g by mouth 4 (four) times daily.        Decreased bowel sounds  NG tube minimal output.  Assessment:    Post-op Ileus - much improved today.  Plan:    Surgeons to manage NG - consider clamping and if pt tolerates this  - removal.  Slowly increase PO once NG removed.  Finis Bud, M.D. 05/09/2021 9:29 AM

## 2021-05-10 ENCOUNTER — Inpatient Hospital Stay: Payer: No Typology Code available for payment source

## 2021-05-10 DIAGNOSIS — K567 Ileus, unspecified: Secondary | ICD-10-CM | POA: Diagnosis not present

## 2021-05-10 DIAGNOSIS — K9189 Other postprocedural complications and disorders of digestive system: Secondary | ICD-10-CM | POA: Diagnosis not present

## 2021-05-10 LAB — GLUCOSE, CAPILLARY
Glucose-Capillary: 106 mg/dL — ABNORMAL HIGH (ref 70–99)
Glucose-Capillary: 114 mg/dL — ABNORMAL HIGH (ref 70–99)
Glucose-Capillary: 131 mg/dL — ABNORMAL HIGH (ref 70–99)
Glucose-Capillary: 145 mg/dL — ABNORMAL HIGH (ref 70–99)
Glucose-Capillary: 89 mg/dL (ref 70–99)
Glucose-Capillary: 90 mg/dL (ref 70–99)
Glucose-Capillary: 91 mg/dL (ref 70–99)

## 2021-05-10 LAB — COMPREHENSIVE METABOLIC PANEL
ALT: 21 U/L (ref 0–44)
AST: 24 U/L (ref 15–41)
Albumin: 2.8 g/dL — ABNORMAL LOW (ref 3.5–5.0)
Alkaline Phosphatase: 55 U/L (ref 38–126)
Anion gap: 7 (ref 5–15)
BUN: 21 mg/dL — ABNORMAL HIGH (ref 6–20)
CO2: 29 mmol/L (ref 22–32)
Calcium: 8 mg/dL — ABNORMAL LOW (ref 8.9–10.3)
Chloride: 104 mmol/L (ref 98–111)
Creatinine, Ser: 0.63 mg/dL (ref 0.44–1.00)
GFR, Estimated: >60 mL/min (ref >60)
Glucose, Bld: 133 mg/dL — ABNORMAL HIGH (ref 70–99)
Potassium: 3.6 mmol/L (ref 3.5–5.1)
Sodium: 140 mmol/L (ref 135–145)
Total Bilirubin: 0.7 mg/dL (ref 0.3–1.2)
Total Protein: 6.5 g/dL (ref 6.5–8.1)

## 2021-05-10 MED ORDER — ACETAMINOPHEN 325 MG PO TABS: 650.0000 mg | ORAL_TABLET | Freq: Four times a day (QID) | ORAL | Status: DC | PRN

## 2021-05-10 NOTE — Progress Notes (Signed)
Pt doing well this am since NG removed Will trial Clear Liquid Diet, advance diet slowly Discharge planning based on tolerability of diet, pain control   Barnett Applebaum, MD, [facility], [facility] 05/10/2021  9:41 AM
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
## 2021-05-10 NOTE — Progress Notes (Signed)
Night RN stated that the pt asked if her NG could come out last night.  RN told pt that MD would have to round and make that decision in the am.  Xray tech called and notified this RN that NG is not currently in the stomach, verified by xray.  When pt returned to room pt told this RN that "the NG had been tugged on and is bothering her".  This RN assessed NG and found that the mark for placement on the tube was at least 12 inches under her nose.  MD notified.

## 2021-05-10 NOTE — Progress Notes (Signed)
Patient to x-ray via wheelchair accompanied by Chong Sicilian.

## 2021-05-10 NOTE — Telephone Encounter (Signed)
My Chart message sent

## 2021-05-10 NOTE — Progress Notes (Signed)
Subjective:    Feels improved today.  Took ice chips last night.  No N/V.  Pt feels tube very uncomfortable. Continues to pass flatus.  Pain controlled.  Objective:    Patient Vitals for the past 2 hrs:  BP Temp Pulse Resp SpO2  05/10/21 0733 (!) 144/96 98.2 F (36.8 C) (!) 102 18 96 %   No intake/output data recorded.  Labs: Results for orders placed or performed during the hospital encounter of 05/06/21 (from the past 24 hour(s))  Glucose, capillary     Status: Abnormal   Collection Time: 05/09/21  7:39 AM  Result Value Ref Range   Glucose-Capillary 100 (H) 70 - 99 mg/dL   Comment 1 Notify RN   Basic metabolic panel     Status: Abnormal   Collection Time: 05/09/21 11:21 AM  Result Value Ref Range   Sodium 143 135 - 145 mmol/L   Potassium 3.5 3.5 - 5.1 mmol/L   Chloride 105 98 - 111 mmol/L   CO2 28 22 - 32 mmol/L   Glucose, Bld 103 (H) 70 - 99 mg/dL   BUN 21 (H) 6 - 20 mg/dL   Creatinine, Ser 0.89 0.44 - 1.00 mg/dL   Calcium 8.0 (L) 8.9 - 10.3 mg/dL   GFR, Estimated >60 >60 mL/min   Anion gap 10 5 - 15  Phosphorus     Status: None   Collection Time: 05/09/21 11:21 AM  Result Value Ref Range   Phosphorus 2.6 2.5 - 4.6 mg/dL  Magnesium     Status: Abnormal   Collection Time: 05/09/21 11:21 AM  Result Value Ref Range   Magnesium 2.8 (H) 1.7 - 2.4 mg/dL  Glucose, capillary     Status: None   Collection Time: 05/09/21 12:08 PM  Result Value Ref Range   Glucose-Capillary 91 70 - 99 mg/dL   Comment 1 Notify RN   Glucose, capillary     Status: None   Collection Time: 05/09/21  4:02 PM  Result Value Ref Range   Glucose-Capillary 92 70 - 99 mg/dL  Glucose, capillary     Status: None   Collection Time: 05/09/21  7:56 PM  Result Value Ref Range   Glucose-Capillary 99 70 - 99 mg/dL  Glucose, capillary     Status: None   Collection Time: 05/10/21 12:09 AM  Result Value Ref Range   Glucose-Capillary 91 70 - 99 mg/dL  Glucose, capillary     Status: None   Collection  Time: 05/10/21  3:54 AM  Result Value Ref Range   Glucose-Capillary 90 70 - 99 mg/dL    Medications    Current Discharge Medication List     CONTINUE these medications which have NOT CHANGED   Details  Cholecalciferol (VITAMIN D3) 125 MCG (5000 UT) CAPS Take 5,000 Units by mouth daily.    doxycycline (VIBRAMYCIN) 100 MG capsule Take 1 capsule (100 mg total) by mouth 2 (two) times daily. Qty: 20 capsule, Refills: 0    losartan-hydrochlorothiazide (HYZAAR) 100-12.5 MG tablet TAKE 1 TABLET BY MOUTH EVERY DAY (STOP LOSARTAN) Qty: 90 tablet, Refills: 3   Associated Diagnoses: Essential hypertension    metFORMIN (GLUCOPHAGE-XR) 500 MG 24 hr tablet Take 1 tablet (500 mg total) by mouth daily with breakfast. Qty: 90 tablet, Refills: 3   Associated Diagnoses: Controlled type 2 diabetes mellitus with other skin complication, without long-term current use of insulin (HCC)    ondansetron (ZOFRAN-ODT) 4 MG disintegrating tablet Take 1 tablet (4 mg total) by  mouth every 6 (six) hours as needed for nausea. Qty: 20 tablet, Refills: 0    oxyCODONE (ROXICODONE) 5 MG immediate release tablet Take 1 tablet (5 mg total) by mouth every 6 (six) hours as needed for severe pain. Qty: 20 tablet, Refills: 0   Associated Diagnoses: S/P hysterectomy    pantoprazole (PROTONIX) 20 MG tablet Take 1 tablet (20 mg total) by mouth 2 (two) times daily. Qty: 90 tablet, Refills: 1   Associated Diagnoses: Chronic gastric ulcer without hemorrhage and without perforation; Gastroesophageal reflux disease without esophagitis    rosuvastatin (CRESTOR) 5 MG tablet TAKE 1 TABLET BY MOUTH EVERYDAY AT BEDTIME Qty: 90 tablet, Refills: 0   Comments: Pt due for BW at 2/23 visit- no refills until see in office/due blood work Associated Diagnoses: Hyperlipidemia associated with type 2 diabetes mellitus (HCC)    SYMBICORT 160-4.5 MCG/ACT inhaler Inhale 2 puffs into the lungs in the morning and at bedtime. Qty: 10.2 g, Refills:  11   Comments: Pt. Does not tolerate powdered inhalers    albuterol (PROVENTIL) (2.5 MG/3ML) 0.083% nebulizer solution Take 3 mLs (2.5 mg total) by nebulization every 6 (six) hours as needed for wheezing or shortness of breath. Qty: 150 mL, Refills: 1    albuterol (VENTOLIN HFA) 108 (90 Base) MCG/ACT inhaler Inhale 2 puffs into the lungs every 6 (six) hours as needed for wheezing or shortness of breath. May also use 2 puffs prior to exercise for EIB Qty: 8 g, Refills: 5   Associated Diagnoses: Persistent asthma without complication, unspecified asthma severity    Semaglutide, 1 MG/DOSE, (OZEMPIC, 1 MG/DOSE,) 4 MG/3ML SOPN Inject 1 mg into the skin once a week. 1 ml; MONDAY    sucralfate (CARAFATE) 1 g tablet Take 1 g by mouth 4 (four) times daily.         Pt to x-ray - NG tube not in the stomach  Bowel sounds decreased   Assessment:    Ileus - pt slowly improving. Surgery to manage - consider pulling tube today  Plan:    Pull NG tube??  Slowly advance diet.     Finis Bud, M.D. 05/10/2021 7:38 AM

## 2021-05-10 NOTE — Progress Notes (Signed)
[facility] SURGICAL PROGRESS NOTE (cpt [phone])  Hospital Day(s): 3.   Interval History: Patient seen and examined, no acute events or new complaints overnight. Patient reports she is feeling better with improvement in her distension. She denied any abdominal pain. She denies fever, chills, nausea, emesis, burping, or hiccups. No new labs this morning. KUB still with dilated loops of bowel, there is no gastric distension, and NGT in poor position. NGT has had 200 ccs out overnight; this is very dilute consistent with stomach fluid. She continues to endorse flatus. She is ambulating.   Review of Systems:  Constitutional: denies fever, chills  HEENT: denies cough or congestion  Respiratory: denies any shortness of breath  Cardiovascular: denies chest pain or palpitations  Gastrointestinal: + distension (improved), denied abdominal pain, N/V Genitourinary: denies burning with urination or urinary frequency Musculoskeletal: denies pain, decreased motor or sensation  Vital signs in last 24 hours: [min-max] current  Temp:  [98 F (36.7 C)-99.5 F (37.5 C)] 98.2 F (36.8 C) (02/06 0733) Pulse Rate:  [92-104] 102 (02/06 0733) Resp:  [18-20] 18 (02/06 0733) BP: (120-144)/(64-96) 144/96 (02/06 0733) SpO2:  [84 %-99 %] 96 % (02/06 0733)     Height: 5\' 2"  (157.5 cm) Weight: 90.7 kg BMI (Calculated): 36.57   Intake/Output last 2 shifts:  02/05 0701 - 02/06 0700 In: 1322.2 [I.V.:1222.2; NG/GT:100] Out: 1050 [Urine:850; Emesis/NG output:200]   Physical Exam:  Constitutional: alert, cooperative and no distress  HENT: normocephalic without obvious abnormality; NGT in place, essentially removed Eyes: PERRL, EOM's grossly intact and symmetric  Respiratory: breathing non-labored at rest  Cardiovascular: regular rate and sinus rhythm  Gastrointestinal: soft, non-tender, mild distension and tympany with percussion, no rebound/guarding Integumentary: Laparoscopic incisions are CDI with  dermabond, no erythema    Labs:  CBC Latest Ref Rng & Units 05/07/2021 05/06/2021 02/08/2021  WBC 4.0 - 10.5 K/uL 15.0(H) 16.6(H) 4.6  Hemoglobin 12.0 - 15.0 g/dL 13.4 14.2 13.9  Hematocrit 36.0 - 46.0 % 40.3 44.0 43.2  Platelets 150 - 400 K/uL 512(H) 473(H) 390   CMP Latest Ref Rng & Units 05/09/2021 05/06/2021 02/08/2021  Glucose 70 - 99 mg/dL 103(H) 148(H) 84  BUN 6 - 20 mg/dL 21(H) 13 14  Creatinine 0.44 - 1.00 mg/dL 0.89 0.87 0.78  Sodium 135 - 145 mmol/L 143 133(L) 138  Potassium 3.5 - 5.1 mmol/L 3.5 3.8 4.2  Chloride 98 - 111 mmol/L 105 99 103  CO2 22 - 32 mmol/L 28 26 27   Calcium 8.9 - 10.3 mg/dL 8.0(L) 8.6(L) 9.8  Total Protein 6.5 - 8.1 g/dL - 7.7 7.5  Total Bilirubin 0.3 - 1.2 mg/dL - 1.4(H) 0.5  Alkaline Phos 38 - 126 U/L - 62 -  AST 15 - 41 U/L - 17 12  ALT 0 - 44 U/L - 12 10     Imaging studies:   KUB + CXR (05/10/2021) personally reviewed which shows similar appearance to dilated bowel loops in central abdomen, no stomach distension, some air in the colon, NGT in poor position, no free air, and radiologist report reviewed below:  IMPRESSION: 1. Interval retraction of enteric tube with tip and side port now projected over the superior/mid aspect of the esophagus. Advancement at least 25 cm is advised. 2. Similar findings of hypoventilation, pulmonary edema, small bilateral effusions and associated bibasilar opacities atelectasis versus infiltrate. 3. Findings most suggestive of worsening at least partial small bowel obstruction. Continued attention on follow-up is advised.   Assessment/Plan: (ICD-10's: K56.609) 47 y.o.  female with clinically improving post-operative ileus 6 days s/p laparoscopic hysterectomy    - Her NGT was essentially hanging out of her nose. KUB looks reasonable and she is clinically doing weil. As such, I will go ahead and remove NGT. She can trial CLD. Encouraged her to take this slow. She understands that if her emesis or distension return or  worsen, she may need NGT replaced.    - No indication for surgical intervention at this time  - Monitor abdominal examination; on-going bowel function   - Pain control prn (limit narcotics as much as feasible); antiemetics prn - Mobilization encouraged; OOB as tolerated and encouraged  - Further management per OB/GYN service; we will be happy to follow along   All of the above findings and recommendations were discussed with the patient, patient's family at bedside, and the medical team, and all of patient's and family's questions were answered to their expressed satisfaction.  -- Edison Simon, PA-C [facility] 05/10/2021, 8:01 AM [phone] M-F: 7am - 4pm

## 2021-05-10 NOTE — Progress Notes (Addendum)
Patient states she "does not want Donna Reyes (husband; "we're separated" or Virlin Bobbye Charleston (mother-in-law) to visit" her in the hospital. Laurita Quint RN, shift coordinater, and Kandice Hams RN, Hydrographic surveyor, notified by me. Patient inquired about a restraining order and nurse encouraged patient to call [facility] Department regarding restraining order procedure. Patient moved from room 349 AA to room 338 AA for security purposes.

## 2021-05-11 ENCOUNTER — Ambulatory Visit: Payer: No Typology Code available for payment source | Admitting: Internal Medicine

## 2021-05-11 DIAGNOSIS — K9189 Other postprocedural complications and disorders of digestive system: Secondary | ICD-10-CM | POA: Diagnosis not present

## 2021-05-11 DIAGNOSIS — K567 Ileus, unspecified: Secondary | ICD-10-CM | POA: Diagnosis not present

## 2021-05-11 LAB — CBC
HCT: 30.6 % — ABNORMAL LOW (ref 36.0–46.0)
Hemoglobin: 9.5 g/dL — ABNORMAL LOW (ref 12.0–15.0)
MCH: 26.8 pg (ref 26.0–34.0)
MCHC: 31 g/dL (ref 30.0–36.0)
MCV: 86.2 fL (ref 80.0–100.0)
Platelets: 436 10*3/uL — ABNORMAL HIGH (ref 150–400)
RBC: 3.55 MIL/uL — ABNORMAL LOW (ref 3.87–5.11)
RDW: 12.9 % (ref 11.5–15.5)
WBC: 5.7 10*3/uL (ref 4.0–10.5)
nRBC: 0 % (ref 0.0–0.2)

## 2021-05-11 LAB — GLUCOSE, CAPILLARY
Glucose-Capillary: 107 mg/dL — ABNORMAL HIGH (ref 70–99)
Glucose-Capillary: 116 mg/dL — ABNORMAL HIGH (ref 70–99)
Glucose-Capillary: 123 mg/dL — ABNORMAL HIGH (ref 70–99)

## 2021-05-11 NOTE — Progress Notes (Signed)
Pt discharged home.  Discharge instructions, prescriptions and follow up appointment given to and reviewed with pt.  Pt verbalized understanding.  Escorted by auxillary. 

## 2021-05-11 NOTE — Progress Notes (Signed)
[facility] SURGICAL PROGRESS NOTE (cpt [phone])  Hospital Day(s): 4.   Interval History: Patient seen and examined, no acute events or new complaints overnight. Patient reports she is feeling a lot better this morning and she is anxious to go home. She denied any abdominal pain. She denies fever, chills, nausea, emesis, burping, or hiccups. She is without leukocytosis this morning. No new images. She has tolerated CLD. She is passing flatus and having numerous BMs.   Review of Systems:  Constitutional: denies fever, chills  HEENT: denies cough or congestion  Respiratory: denies any shortness of breath  Cardiovascular: denies chest pain or palpitations  Gastrointestinal: denied abdominal pain, N/V Genitourinary: denies burning with urination or urinary frequency Musculoskeletal: denies pain, decreased motor or sensation  Vital signs in last 24 hours: [min-max] current  Temp:  [98.3 F (36.8 C)-99.3 F (37.4 C)] 98.8 F (37.1 C) (02/07 0304) Pulse Rate:  [71-107] 71 (02/07 0304) Resp:  [16-18] 16 (02/07 0304) BP: (125-142)/(81-91) 125/82 (02/07 0304) SpO2:  [65 %-100 %] 94 % (02/07 0615)     Height: 5\' 2"  (157.5 cm) Weight: 90.7 kg BMI (Calculated): 36.57   Intake/Output last 2 shifts:  02/06 0701 - 02/07 0700 In: 1944.8 [P.O.:450; I.V.:1494.8] Out: 550 [Urine:550]   Physical Exam:  Constitutional: alert, cooperative and no distress  HENT: normocephalic without obvious abnormality Eyes: PERRL, EOM's grossly intact and symmetric  Respiratory: breathing non-labored at rest  Cardiovascular: regular rate and sinus rhythm  Gastrointestinal: soft, non-tender, mild distension and tympany with percussion, no rebound/guarding Integumentary: Laparoscopic incisions are CDI with dermabond, no erythema    Labs:  CBC Latest Ref Rng & Units 05/11/2021 05/07/2021 05/06/2021  WBC 4.0 - 10.5 K/uL 5.7 15.0(H) 16.6(H)  Hemoglobin 12.0 - 15.0 g/dL 9.5(L) 13.4 14.2  Hematocrit 36.0 -  46.0 % 30.6(L) 40.3 44.0  Platelets 150 - 400 K/uL 436(H) 512(H) 473(H)   CMP Latest Ref Rng & Units 05/10/2021 05/09/2021 05/06/2021  Glucose 70 - 99 mg/dL 133(H) 103(H) 148(H)  BUN 6 - 20 mg/dL 21(H) 21(H) 13  Creatinine 0.44 - 1.00 mg/dL 0.63 0.89 0.87  Sodium 135 - 145 mmol/L 140 143 133(L)  Potassium 3.5 - 5.1 mmol/L 3.6 3.5 3.8  Chloride 98 - 111 mmol/L 104 105 99  CO2 22 - 32 mmol/L 29 28 26   Calcium 8.9 - 10.3 mg/dL 8.0(L) 8.0(L) 8.6(L)  Total Protein 6.5 - 8.1 g/dL 6.5 - 7.7  Total Bilirubin 0.3 - 1.2 mg/dL 0.7 - 1.4(H)  Alkaline Phos 38 - 126 U/L 55 - 62  AST 15 - 41 U/L 24 - 17  ALT 0 - 44 U/L 21 - 12     Imaging studies: No new pertinent imaging studies    Assessment/Plan: (ICD-10's: K82.609) 46 y.o. female with clinically improving/resolved post-operative ileus 6 days s/p laparoscopic hysterectomy    - Advance to full liquids for breakfast / soft diet for lunch   - No indication for surgical intervention at this time  - Monitor abdominal examination; on-going bowel function   - Pain control prn (limit narcotics as much as feasible); antiemetics prn - Mobilization encouraged; OOB as tolerated and encouraged  - Further management per OB/GYN service   - Discharge Planning: if she tolerates advancement of diet this morning, she can go home this afternoon. She does not need general surgery follow up. Follow up with OB/Gyn as scheduled   All of the above findings and recommendations were discussed with the patient, patient's family at bedside,  and the medical team, and all of patient's and family's questions were answered to their expressed satisfaction.  -- Edison Simon, PA-C [facility] 05/11/2021, 7:41 AM [phone] M-F: 7am - 4pm

## 2021-05-11 NOTE — Discharge Summary (Signed)
Gynecology Physician Postoperative Discharge Summary  Patient ID: Donna Reyes MRN: [id number] DOB/AGE: 1975-07-28 46 y.o.  Admit Date: 05/06/2021 Discharge Date: 05/11/2021  Preoperative Diagnoses: Fibroids and menorrhagia Postoperative diagnosis: Ileus  Procedures: Readmission, NG tube placement  Significant Labs: CBC Latest Ref Rng & Units 05/11/2021 05/07/2021 05/06/2021  WBC 4.0 - 10.5 K/uL 5.7 15.0(H) 16.6(H)  Hemoglobin 12.0 - 15.0 g/dL 9.5(L) 13.4 14.2  Hematocrit 36.0 - 46.0 % 30.6(L) 40.3 44.0  Platelets 150 - 400 K/uL 436(H) 512(H) 473(H)    Hospital Course:  Donna Reyes is a 46 y.o. [id number]  admitted 2 days after scheduled surgery for her fibroids and menorrhagia, having TLH BS done 05/04/21.  She underwent the procedures as mentioned above, her operation was uncomplicated. For further details about surgery, please refer to the operative report. She was admitted with symptoms of nausea and vomiting, and noted to have ileus, with other imaging studies reassuring.  She was treated w NG tube and bowel rest.  By time of discharge on HD#5, her pain was controlled on oral pain medications; she was ambulating, voiding without difficulty, tolerating regular diet and passing flatus. She was deemed stable for discharge to home.   Discharge Exam: Blood pressure 126/80, pulse 78, temperature 98.3 F (36.8 C), temperature source Oral, resp. rate 20, height 5\' 2"  (1.575 m), weight 90.7 kg, last menstrual period 04/21/2021, SpO2 96 %. General appearance: alert and no distress  Resp: clear to auscultation bilaterally  Cardio: regular rate and rhythm  GI: soft, non-tender; bowel sounds normal; no masses, no organomegaly.  Incision: C/D/I, no erythema, no drainage noted Pelvic: scant blood on pad  Extremities: extremities normal, atraumatic, no cyanosis or edema and Homans sign is negative, no sign of DVT  Discharged Condition: Stable  Disposition:  Discharge  disposition: 01-Home or Self Care       Discharge Instructions     Call MD for:  difficulty breathing, headache or visual disturbances   Complete by: As directed    Call MD for:  extreme fatigue   Complete by: As directed    Call MD for:  hives   Complete by: As directed    Call MD for:  persistant dizziness or light-headedness   Complete by: As directed    Call MD for:  persistant nausea and vomiting   Complete by: As directed    Call MD for:  redness, tenderness, or signs of infection (pain, swelling, redness, odor or green/yellow discharge around incision site)   Complete by: As directed    Call MD for:  severe uncontrolled pain   Complete by: As directed    Call MD for:  temperature >100.4   Complete by: As directed    Diet general   Complete by: As directed    Driving Restrictions   Complete by: As directed    Do not drive while taking narcotic medications   Increase activity slowly   Complete by: As directed    May shower / Bathe   Complete by: As directed    Remove dressing in 24 hours   Complete by: As directed       Allergies as of 05/11/2021       Reactions   Capsicum Shortness Of Breath   Jalapeno peppers   Basil Oil Swelling   Tongue swelling   Lisinopril    fatigue        Medication List     TAKE these medications    albuterol  108 (90 Base) MCG/ACT inhaler Commonly known as: VENTOLIN HFA Inhale 2 puffs into the lungs every 6 (six) hours as needed for wheezing or shortness of breath. May also use 2 puffs prior to exercise for EIB What changed:  reasons to take this additional instructions   albuterol (2.5 MG/3ML) 0.083% nebulizer solution Commonly known as: PROVENTIL Take 3 mLs (2.5 mg total) by nebulization every 6 (six) hours as needed for wheezing or shortness of breath. What changed: reasons to take this   doxycycline 100 MG capsule Commonly known as: VIBRAMYCIN Take 1 capsule (100 mg total) by mouth 2 (two) times daily.    losartan-hydrochlorothiazide 100-12.5 MG tablet Commonly known as: HYZAAR TAKE 1 TABLET BY MOUTH EVERY DAY (STOP LOSARTAN)   metFORMIN 500 MG 24 hr tablet Commonly known as: GLUCOPHAGE-XR Take 1 tablet (500 mg total) by mouth daily with breakfast.   ondansetron 4 MG disintegrating tablet Commonly known as: ZOFRAN-ODT Take 1 tablet (4 mg total) by mouth every 6 (six) hours as needed for nausea.   oxyCODONE 5 MG immediate release tablet Commonly known as: Roxicodone Take 1 tablet (5 mg total) by mouth every 6 (six) hours as needed for severe pain.   Ozempic (1 MG/DOSE) 4 MG/3ML Sopn Generic drug: Semaglutide (1 MG/DOSE) Inject 1 mg into the skin once a week. 1 ml; MONDAY   pantoprazole 20 MG tablet Commonly known as: PROTONIX Take 1 tablet (20 mg total) by mouth 2 (two) times daily. What changed: when to take this   rosuvastatin 5 MG tablet Commonly known as: CRESTOR TAKE 1 TABLET BY MOUTH EVERYDAY AT BEDTIME   sucralfate 1 g tablet Commonly known as: CARAFATE Take 1 g by mouth 4 (four) times daily.   Symbicort 160-4.5 MCG/ACT inhaler Generic drug: budesonide-formoterol Inhale 2 puffs into the lungs in the morning and at bedtime. What changed: when to take this   Vitamin D3 125 MCG (5000 UT) Caps Take 5,000 Units by mouth daily.        Follow-up Information     Gae Dry, MD. Go in 1 week(s).   Specialty: Obstetrics and Gynecology Why: Keep appt on 05/17/21 Contact information: [address] [place] [address] [phone]                 Barnett Applebaum, MD

## 2021-05-11 NOTE — Progress Notes (Signed)
Benign Gynecology Progress Note  Admission Date: 05/06/2021 Current Date: 05/11/2021  Donna Reyes is a 46 y.o. [id number] HD#5 for postop ileus   History complicated by: Patient Active Problem List   Diagnosis Date Noted   Ileus following gastrointestinal surgery ([place])    Small bowel obstruction ([place]) 05/06/2021   Leiomyoma 01/07/2021   Abnormal uterine bleeding (AUB) 01/07/2021   Class 2 severe obesity with serious comorbidity and body mass index (BMI) of 37.0 to 37.9 in adult [facility]) 03/10/2020   Controlled type 2 diabetes mellitus with skin complication ([place]) 29/51/8841   Essential hypertension 01/06/2016   Mild intermittent asthma with acute exacerbation 01/06/2016   Iron deficiency anemia 12/30/2015   ROS and patient/family/surgical history, located on admission H&P note dated 05/06/2021, have been reviewed, and there are no changes except as noted below  Yesterday/Overnight Events:  NG out yesterday  Subjective:  She has tolerated clear liquid diet well She has minimal appetite, does not feel starved Pos BM and flatus yesterday  Objective:   Vitals:   05/11/21 0100 05/11/21 0200 05/11/21 0304 05/11/21 0400  BP:   125/82   Pulse:   71   Resp:   16   Temp:   98.8 F (37.1 C)   TempSrc:   Oral   SpO2: 100% 100% 99% 99%  Weight:      Height:       Physical exam: General appearance: alert, cooperative, and appears stated age Abdomen: soft, non-tender; bowel sounds normal; no masses, no organomegaly   Incisions healing well GU: No gross VB Lungs: clear to auscultation bilaterally Heart: regular rate and rhythm and no MRGs Extremities: no redness or tenderness in the calves or thighs, no edema Skin: no lesions Psych: appropriate  Recent Labs  Lab 05/06/21 1333 05/09/21 1121 05/10/21 1045  NA 133* 143 140  K 3.8 3.5 3.6  CL 99 105 104  CO2 26 28 29   BUN 13 21* 21*  CREATININE 0.87 0.89 0.63  GLUCOSE 148* 103* 133*   Recent Labs  Lab 05/06/21 1333  05/07/21 0749  WBC 16.6* 15.0*  HGB 14.2 13.4  HCT 44.0 40.3  PLT 473* 512*    Recent Labs  Lab 05/06/21 1333 05/09/21 1121 05/10/21 1045  CALCIUM 8.6* 8.0* 8.0*  MG  --  2.8*  --   PHOS  --  2.6  --    No results for input(s): INR, APTT in the last 168 hours.    Recent Labs  Lab 05/06/21 1333 05/10/21 1045  ALKPHOS 62 55  BILITOT 1.4* 0.7  PROT 7.7 6.5  ALT 12 21  AST 17 24    Recent Labs  Lab 05/06/21 1333 05/07/21 0749  WBC 16.6* 15.0*  HGB 14.2 13.4  HCT 44.0 40.3  PLT 473* 512*   Recent Labs  Lab 05/06/21 1333 05/09/21 1121 05/10/21 1045  NA 133* 143 140  K 3.8 3.5 3.6  CL 99 105 104  CO2 26 28 29   BUN 13 21* 21*  CREATININE 0.87 0.89 0.63  CALCIUM 8.6* 8.0* 8.0*  PROT 7.7  --  6.5  BILITOT 1.4*  --  0.7  ALKPHOS 62  --  55  ALT 12  --  21  AST 17  --  24  GLUCOSE 148* 103* 133*   Assessment & Plan:  Post Op Ileus Gen Surgery on board Slowly advancing diet Has had BM and flatus, tolerated clear liquids S/p hysterectomy HTN, stable DM, stable (on meds)  OSA, O2 when sleeps, no new concerns  Discharge planning, based on improved ileus, ability to tolerate regular diet Pt has desire to go home when appropriate  Barnett Applebaum, MD, [facility], [facility] 05/11/2021  5:31 AM

## 2021-05-12 ENCOUNTER — Ambulatory Visit
Admission: RE | Admit: 2021-05-12 | Discharge: 2021-05-12 | Disposition: A | Discharge status: Home / Self Care | Payer: No Typology Code available for payment source | Source: Ambulatory Visit | Attending: Family Medicine | Admitting: Family Medicine

## 2021-05-12 ENCOUNTER — Other Ambulatory Visit: Payer: Self-pay

## 2021-05-12 DIAGNOSIS — T148XXD Other injury of unspecified body region, subsequent encounter: Secondary | ICD-10-CM | POA: Diagnosis not present

## 2021-05-12 NOTE — ED Triage Notes (Signed)
Pt here to have sutures removed from left elbow. Wound is clean, dry and intact.

## 2021-05-12 NOTE — ED Notes (Signed)
Pt lacerations began to open

## 2021-05-12 NOTE — ED Notes (Signed)
Advice sought by Roxy Manns, PA for opening of the lacerations during suture removal. Recommended steri-strip placement. 2 strips placed over both lacerations. No active drainage or bleeding and no pain present.

## 2021-05-17 ENCOUNTER — Other Ambulatory Visit: Payer: Self-pay

## 2021-05-17 ENCOUNTER — Encounter: Payer: Self-pay | Admitting: Obstetrics & Gynecology

## 2021-05-17 ENCOUNTER — Encounter: Payer: Self-pay | Admitting: Hematology and Oncology

## 2021-05-17 ENCOUNTER — Ambulatory Visit (INDEPENDENT_AMBULATORY_CARE_PROVIDER_SITE_OTHER): Payer: No Typology Code available for payment source | Admitting: Obstetrics & Gynecology

## 2021-05-17 VITALS — BP 120/80 | Ht 62.0 in | Wt 192.0 lb

## 2021-05-17 DIAGNOSIS — Z90710 Acquired absence of both cervix and uterus: Secondary | ICD-10-CM

## 2021-05-17 NOTE — Progress Notes (Signed)
°  Postoperative Follow-up Patient presents post op from  [facility] BS  for fibroids and pelvic pain, 2 weeks ago.  Subjective: Patient reports some improvement in her preop symptoms. Also she is doing better since her re-admission and treatment for Ileus w NG tube and bowel rest.  She now has some loose stools, and gas.  DOes not take narcotics because of effect on bowels.  Tylenol prn.   No vag bleeding. Incisions healing well  Objective: BP 120/80    Ht 5\' 2"  (1.575 m)    Wt 192 lb (87.1 kg)    LMP 04/21/2021    BMI 35.12 kg/m  Physical Exam Constitutional:      General: She is not in acute distress.    Appearance: She is well-developed.  Cardiovascular:     Rate and Rhythm: Normal rate.  Pulmonary:     Effort: Pulmonary effort is normal.  Abdominal:     General: There is no distension.     Palpations: Abdomen is soft.     Tenderness: There is no abdominal tenderness.     Comments: Incision Healing Well   Musculoskeletal:        General: Normal range of motion.  Neurological:     Mental Status: She is alert and oriented to person, place, and time.     Cranial Nerves: No cranial nerve deficit.  Skin:    General: Skin is warm and dry.    Assessment: s/p :  total laparoscopic hysterectomy with bilateral salpingectomy stable  Plan: Patient has done well after surgery with no apparent complications.  I have discussed the post-operative course to date, and the expected progress moving forward.  The patient understands what complications to be concerned about.  I will see the patient in routine follow up, or sooner if needed.    Activity plan: May return to work in 11 days.  Pelvic rest.  Hoyt Koch 05/17/2021, 3:31 PM

## 2021-05-21 ENCOUNTER — Encounter: Payer: Self-pay | Admitting: Internal Medicine

## 2021-05-21 ENCOUNTER — Ambulatory Visit (INDEPENDENT_AMBULATORY_CARE_PROVIDER_SITE_OTHER): Payer: No Typology Code available for payment source | Admitting: Internal Medicine

## 2021-05-21 VITALS — BP 124/80 | HR 98 | Temp 98.5°F | Resp 16 | Ht 62.0 in | Wt 190.0 lb

## 2021-05-21 DIAGNOSIS — E785 Hyperlipidemia, unspecified: Secondary | ICD-10-CM

## 2021-05-21 DIAGNOSIS — Z1231 Encounter for screening mammogram for malignant neoplasm of breast: Secondary | ICD-10-CM

## 2021-05-21 DIAGNOSIS — I10 Essential (primary) hypertension: Secondary | ICD-10-CM

## 2021-05-21 DIAGNOSIS — K9189 Other postprocedural complications and disorders of digestive system: Secondary | ICD-10-CM

## 2021-05-21 DIAGNOSIS — E1169 Type 2 diabetes mellitus with other specified complication: Secondary | ICD-10-CM

## 2021-05-21 DIAGNOSIS — J45909 Unspecified asthma, uncomplicated: Secondary | ICD-10-CM | POA: Diagnosis not present

## 2021-05-21 DIAGNOSIS — J4520 Mild intermittent asthma, uncomplicated: Secondary | ICD-10-CM

## 2021-05-21 DIAGNOSIS — K567 Ileus, unspecified: Secondary | ICD-10-CM

## 2021-05-21 DIAGNOSIS — E11628 Type 2 diabetes mellitus with other skin complications: Secondary | ICD-10-CM | POA: Diagnosis not present

## 2021-05-21 MED ORDER — METFORMIN HCL ER 500 MG PO TB24: 500.0000 mg | ORAL_TABLET | Freq: Every day | ORAL | 3 refills | Status: DC

## 2021-05-21 MED ORDER — ALBUTEROL SULFATE HFA 108 (90 BASE) MCG/ACT IN AERS: 2.0000 | INHALATION_SPRAY | Freq: Four times a day (QID) | RESPIRATORY_TRACT | 5 refills | Status: AC | PRN

## 2021-05-21 NOTE — Assessment & Plan Note (Signed)
Doing well, last A1c 6.0%. Continue Metformin, refilled today.

## 2021-05-21 NOTE — Assessment & Plan Note (Signed)
Stable, blood pressure at goal, continue current medications.

## 2021-05-21 NOTE — Patient Instructions (Addendum)
It was great seeing you today!  Plan discussed at today's visit: -Refills sent  -Mammogram ordered  Follow up in: 6 months   Take care and let us know if you have any questions or concerns prior to your next visit.  Dr. Rosana Berger

## 2021-05-21 NOTE — Progress Notes (Signed)
Established Patient Office Visit  Subjective:  Patient ID: Donna Reyes, female    DOB: Feb 23, 1976  Age: 46 y.o. MRN: [id number]  CC:  Chief Complaint  Patient presents with   Follow-up   Hypertension   Hyperlipidemia   Diabetes   paperwork    FMLA    HPI Donna Reyes presents for follow up on chronic medical conditions. Recently had a hysterectomy 2 weeks ago followed by an ileus but is doing well now. BM normal. Hysterectomy from fibroids.  Hypertension: -Medications: Losartan-HCTZ 100-12.5 -Patient is compliant with above medications and reports no side effects. -Checking BP at home (average): 135/82 -Denies any SOB, CP, vision changes, LE edema or symptoms of hypotension  HLD: -Medications: Crestor 5 -Patient is compliant with above medications and reports no side effects.  -Last lipid panel: Lipid Panel     Component Value Date/Time   CHOL 212 (H) 11/10/2020 1024   TRIG 164 (H) 11/10/2020 1024   HDL 38 (L) 11/10/2020 1024   CHOLHDL 5.6 (H) 11/10/2020 1024   LDLCALC 144 (H) 11/10/2020 1024    Diabetes, Type 2: -Last A1c 2/23 6.0% -Medications: Metformin 500  -Patient is compliant with the above medications and reports no side effects.  -Eye exam: 9/22 -Foot exam: 9/22 -Microalbumin: 2021, no issues on ARB -Statin: yes -PNA vaccine: UTD -Denies symptoms of hypoglycemia, polyuria, polydipsia, numbness extremities, foot ulcers/trauma.   Asthma:  -Seeing Pulmonology  -Asthma status: controlled -Current Treatments: Symbicort, Albuterol  -Satisfied with current treatment?: yes -Albuterol/rescue inhaler frequency: rarely  -Limitation of activity: no -Current upper respiratory symptoms: no -Pneumovax: Up to Date -Influenza: Up to Date  Health Maintenance: -Blood work up to date -Mammogram 3/22, due soon, will schedule   Past Medical History:  Diagnosis Date   Allergy    Anemia    Anxiety 03/05/2019   Asthma    BRCA negative  10/2019   MyRisk neg; IBIS=7.8%   Diabetes mellitus without complication ([place])    type 2   Family history of adverse reaction to anesthesia    Father - slow to wake   Family history of pancreatic cancer    GERD (gastroesophageal reflux disease)    Hypertension    Sleep apnea    Wears contact lenses     Past Surgical History:  Procedure Laterality Date   AXILLARY LYMPH NODE BIOPSY Right 05/25/2017   LYMPHADENITIS. NEGATIVE FOR MALIGNANCY.    BREAST BIOPSY Right 05/25/2017   neg/NEGATIVE FOR CARCINOMA. INFLAMMATION AND SQUAMOUS METAPLASIA OF A FOCAL DUCT    CESAREAN SECTION     2002,2006,2007   COLONOSCOPY WITH PROPOFOL N/A 06/23/2020   Procedure: COLONOSCOPY WITH PROPOFOL;  Surgeon: Virgel Manifold, MD;  Location: [facility];  Service: Endoscopy;  Laterality: N/A;  Diabetic  sleep apnea priority 4   CYSTOSCOPY N/A 05/04/2021   Procedure: CYSTOSCOPY;  Surgeon: Gae Dry, MD;  Location: [facility];  Service: Gynecology;  Laterality: N/A;   TOTAL LAPAROSCOPIC HYSTERECTOMY WITH SALPINGECTOMY Bilateral 05/04/2021   Procedure: TOTAL LAPAROSCOPIC HYSTERECTOMY WITH BILATERAL SALPINGECTOMY;  Surgeon: Gae Dry, MD;  Location: [facility];  Service: Gynecology;  Laterality: Bilateral;   TUBAL LIGATION  2007    Family History  Problem Relation Age of Onset   Diabetes Mother    Heart failure Mother    Kidney disease Mother    Diabetes Maternal Grandmother    Breast cancer Maternal Grandmother 62   Hypertension Father    Hyperlipidemia Father  Atrial fibrillation Sister    ADD / ADHD Son    Heart attack Maternal Grandfather    Diabetes Paternal Grandmother    Hypertension Paternal Grandmother    Prostate cancer Paternal Grandfather    Colon cancer Paternal Grandfather 12   Polycystic ovary syndrome Sister    Polycystic ovary syndrome Sister    Pancreatic cancer Paternal Aunt 13   Ovarian cancer Neg Hx     Social History   Socioeconomic History    Marital status: Legally Separated    Spouse name: Timmothy Sours   Number of children: 5   Years of education: 12   Highest education level: Associate degree: academic program  Occupational History   Not on file  Tobacco Use   Smoking status: Never   Smokeless tobacco: Never  Vaping Use   Vaping Use: Never used  Substance and Sexual Activity   Alcohol use: No   Drug use: No   Sexual activity: Yes    Partners: Male    Birth control/protection: Surgical    Comment: tubal ligation   Other Topics Concern   Not on file  Social History Narrative   Not on file   Social Determinants of Health   Financial Resource Strain: Not on file  Food Insecurity: Not on file  Transportation Needs: Not on file  Physical Activity: Not on file  Stress: Not on file  Social Connections: Not on file  Intimate Partner Violence: Not on file    Outpatient Medications Prior to Visit  Medication Sig Dispense Refill   albuterol (PROVENTIL) (2.5 MG/3ML) 0.083% nebulizer solution Take 3 mLs (2.5 mg total) by nebulization every 6 (six) hours as needed for wheezing or shortness of breath. (Patient taking differently: Take 2.5 mg by nebulization every 6 (six) hours as needed (Asthma).) 150 mL 1   albuterol (VENTOLIN HFA) 108 (90 Base) MCG/ACT inhaler Inhale 2 puffs into the lungs every 6 (six) hours as needed for wheezing or shortness of breath. May also use 2 puffs prior to exercise for EIB (Patient taking differently: Inhale 2 puffs into the lungs every 6 (six) hours as needed (Asthma).) 8 g 5   Cholecalciferol (VITAMIN D3) 125 MCG (5000 UT) CAPS Take 5,000 Units by mouth daily.     doxycycline (VIBRAMYCIN) 100 MG capsule Take 1 capsule (100 mg total) by mouth 2 (two) times daily. 20 capsule 0   losartan-hydrochlorothiazide (HYZAAR) 100-12.5 MG tablet TAKE 1 TABLET BY MOUTH EVERY DAY (STOP LOSARTAN) 90 tablet 3   metFORMIN (GLUCOPHAGE-XR) 500 MG 24 hr tablet Take 1 tablet (500 mg total) by mouth daily with breakfast. 90  tablet 3   ondansetron (ZOFRAN-ODT) 4 MG disintegrating tablet Take 1 tablet (4 mg total) by mouth every 6 (six) hours as needed for nausea. 20 tablet 0   rosuvastatin (CRESTOR) 5 MG tablet TAKE 1 TABLET BY MOUTH EVERYDAY AT BEDTIME 90 tablet 0   Semaglutide, 1 MG/DOSE, (OZEMPIC, 1 MG/DOSE,) 4 MG/3ML SOPN Inject 1 mg into the skin once a week. 1 ml; MONDAY     sucralfate (CARAFATE) 1 g tablet Take 1 g by mouth 4 (four) times daily.     SYMBICORT 160-4.5 MCG/ACT inhaler Inhale 2 puffs into the lungs in the morning and at bedtime. (Patient taking differently: Inhale 2 puffs into the lungs daily.) 10.2 g 11   No facility-administered medications prior to visit.    Allergies  Allergen Reactions   Capsicum Shortness Of Breath    Jalapeno peppers   Basil Oil  Swelling    Tongue swelling   Lisinopril     fatigue    ROS Review of Systems  Constitutional:  Negative for chills and fever.  Eyes:  Negative for visual disturbance.  Respiratory:  Negative for cough and shortness of breath.   Cardiovascular:  Negative for chest pain.  Gastrointestinal:  Negative for abdominal pain, diarrhea, nausea and vomiting.     Objective:    Physical Exam Constitutional:      Appearance: Normal appearance.  HENT:     Head: Normocephalic and atraumatic.  Eyes:     Conjunctiva/sclera: Conjunctivae normal.  Cardiovascular:     Rate and Rhythm: Normal rate and regular rhythm.  Pulmonary:     Effort: Pulmonary effort is normal.     Breath sounds: Normal breath sounds.  Musculoskeletal:     Right lower leg: No edema.     Left lower leg: No edema.     Comments: Abdominal incisions healing well  Skin:    General: Skin is warm and dry.  Neurological:     General: No focal deficit present.     Mental Status: She is alert. Mental status is at baseline.  Psychiatric:        Mood and Affect: Mood normal.        Behavior: Behavior normal.    LMP 04/21/2021  Wt Readings from Last 3 Encounters:   05/17/21 192 lb (87.1 kg)  05/04/21 200 lb (90.7 kg)  04/27/21 200 lb (90.7 kg)     Health Maintenance Due  Topic Date Due   COVID-19 Vaccine (4 - Booster for Pfizer series) 06/23/2020    There are no preventive care reminders to display for this patient.  Lab Results  Component Value Date   TSH 2.45 08/24/2018   Lab Results  Component Value Date   WBC 5.7 05/11/2021   HGB 9.5 (L) 05/11/2021   HCT 30.6 (L) 05/11/2021   MCV 86.2 05/11/2021   PLT 436 (H) 05/11/2021   Lab Results  Component Value Date   NA 140 05/10/2021   K 3.6 05/10/2021   CO2 29 05/10/2021   GLUCOSE 133 (H) 05/10/2021   BUN 21 (H) 05/10/2021   CREATININE 0.63 05/10/2021   BILITOT 0.7 05/10/2021   ALKPHOS 55 05/10/2021   AST 24 05/10/2021   ALT 21 05/10/2021   PROT 6.5 05/10/2021   ALBUMIN 2.8 (L) 05/10/2021   CALCIUM 8.0 (L) 05/10/2021   ANIONGAP 7 05/10/2021   EGFR 108 11/10/2020   Lab Results  Component Value Date   CHOL 212 (H) 11/10/2020   Lab Results  Component Value Date   HDL 38 (L) 11/10/2020   Lab Results  Component Value Date   LDLCALC 144 (H) 11/10/2020   Lab Results  Component Value Date   TRIG 164 (H) 11/10/2020   Lab Results  Component Value Date   CHOLHDL 5.6 (H) 11/10/2020   Lab Results  Component Value Date   HGBA1C 6.0 (H) 05/07/2021      Assessment & Plan:   Problem List Items Addressed This Visit       Cardiovascular and Mediastinum   Essential hypertension - Primary    Stable, blood pressure at goal, continue current medications.        Respiratory   Asthma    Stable, following with Pulmonology. Albuterol refilled today.      Relevant Medications   albuterol (VENTOLIN HFA) 108 (90 Base) MCG/ACT inhaler     Digestive   Ileus  following gastrointestinal surgery ([place])    Stable, having regular BMs.         Endocrine   Controlled type 2 diabetes mellitus with skin complication ([place])    Doing well, last A1c 6.0%. Continue Metformin,  refilled today.      Relevant Medications   metFORMIN (GLUCOPHAGE-XR) 500 MG 24 hr tablet   Hyperlipidemia associated with type 2 diabetes mellitus ([facility])    Stable, reviewed last lipid panel with patient. Crestor is new since then, continue this and plan to recheck at follow up.       Relevant Medications   metFORMIN (GLUCOPHAGE-XR) 500 MG 24 hr tablet   Other Visit Diagnoses     Encounter for screening mammogram for malignant neoplasm of breast       Relevant Orders   MM Digital Diagnostic Bilat       Meds ordered this encounter  Medications   metFORMIN (GLUCOPHAGE-XR) 500 MG 24 hr tablet    Sig: Take 1 tablet (500 mg total) by mouth daily with breakfast.    Dispense:  90 tablet    Refill:  3   albuterol (VENTOLIN HFA) 108 (90 Base) MCG/ACT inhaler    Sig: Inhale 2 puffs into the lungs every 6 (six) hours as needed for wheezing or shortness of breath. May also use 2 puffs prior to exercise for EIB    Dispense:  8 g    Refill:  5    Follow-up: Return in about 6 months (around 11/18/2021).    Teodora Medici, DO

## 2021-05-21 NOTE — Assessment & Plan Note (Signed)
Stable, having regular BMs.

## 2021-05-21 NOTE — Assessment & Plan Note (Signed)
Stable, reviewed last lipid panel with patient. Crestor is new since then, continue this and plan to recheck at follow up.

## 2021-05-21 NOTE — Assessment & Plan Note (Signed)
Stable, following with Pulmonology. Albuterol refilled today.

## 2021-05-26 ENCOUNTER — Other Ambulatory Visit: Payer: Self-pay | Admitting: *Deleted

## 2021-05-26 DIAGNOSIS — D500 Iron deficiency anemia secondary to blood loss (chronic): Secondary | ICD-10-CM

## 2021-06-04 ENCOUNTER — Other Ambulatory Visit: Payer: Self-pay

## 2021-06-04 ENCOUNTER — Inpatient Hospital Stay: Payer: No Typology Code available for payment source | Attending: Internal Medicine

## 2021-06-04 DIAGNOSIS — I10 Essential (primary) hypertension: Secondary | ICD-10-CM | POA: Insufficient documentation

## 2021-06-04 DIAGNOSIS — E119 Type 2 diabetes mellitus without complications: Secondary | ICD-10-CM | POA: Insufficient documentation

## 2021-06-04 DIAGNOSIS — Z800 Family history of malignant neoplasm of digestive organs: Secondary | ICD-10-CM | POA: Diagnosis not present

## 2021-06-04 DIAGNOSIS — Z8042 Family history of malignant neoplasm of prostate: Secondary | ICD-10-CM | POA: Insufficient documentation

## 2021-06-04 DIAGNOSIS — Z803 Family history of malignant neoplasm of breast: Secondary | ICD-10-CM | POA: Diagnosis not present

## 2021-06-04 DIAGNOSIS — E611 Iron deficiency: Secondary | ICD-10-CM

## 2021-06-04 DIAGNOSIS — D500 Iron deficiency anemia secondary to blood loss (chronic): Secondary | ICD-10-CM

## 2021-06-04 LAB — IRON AND TIBC
Iron: 38 ug/dL (ref 28–170)
Saturation Ratios: 9 % — ABNORMAL LOW (ref 10.4–31.8)
TIBC: 430 ug/dL (ref 250–450)
UIBC: 392 ug/dL

## 2021-06-04 LAB — CBC WITH DIFFERENTIAL/PLATELET
Abs Immature Granulocytes: 0.01 10*3/uL (ref 0.00–0.07)
Basophils Absolute: 0 10*3/uL (ref 0.0–0.1)
Basophils Relative: 0 %
Eosinophils Absolute: 0.4 10*3/uL (ref 0.0–0.5)
Eosinophils Relative: 7 %
HCT: 36.6 % (ref 36.0–46.0)
Hemoglobin: 11.7 g/dL — ABNORMAL LOW (ref 12.0–15.0)
Immature Granulocytes: 0 %
Lymphocytes Relative: 38 %
Lymphs Abs: 1.8 10*3/uL (ref 0.7–4.0)
MCH: 26.5 pg (ref 26.0–34.0)
MCHC: 32 g/dL (ref 30.0–36.0)
MCV: 83 fL (ref 80.0–100.0)
Monocytes Absolute: 0.6 10*3/uL (ref 0.1–1.0)
Monocytes Relative: 12 %
Neutro Abs: 2 10*3/uL (ref 1.7–7.7)
Neutrophils Relative %: 43 %
Platelets: 360 10*3/uL (ref 150–400)
RBC: 4.41 MIL/uL (ref 3.87–5.11)
RDW: 13.2 % (ref 11.5–15.5)
WBC: 4.8 10*3/uL (ref 4.0–10.5)
nRBC: 0 % (ref 0.0–0.2)

## 2021-06-04 LAB — FERRITIN: Ferritin: 18 ng/mL (ref 11–307)

## 2021-06-08 ENCOUNTER — Inpatient Hospital Stay: Payer: No Typology Code available for payment source

## 2021-06-08 ENCOUNTER — Other Ambulatory Visit: Payer: Self-pay

## 2021-06-08 ENCOUNTER — Encounter: Payer: Self-pay | Admitting: Internal Medicine

## 2021-06-08 ENCOUNTER — Inpatient Hospital Stay (HOSPITAL_BASED_OUTPATIENT_CLINIC_OR_DEPARTMENT_OTHER): Payer: No Typology Code available for payment source | Admitting: Internal Medicine

## 2021-06-08 DIAGNOSIS — E611 Iron deficiency: Secondary | ICD-10-CM

## 2021-06-08 DIAGNOSIS — D500 Iron deficiency anemia secondary to blood loss (chronic): Secondary | ICD-10-CM | POA: Diagnosis not present

## 2021-06-08 NOTE — Assessment & Plan Note (Addendum)
# ?  Iron deficiency anemia: Secondary history of heavy menstrual blood loss.  Hb11; ferritin 20.  Hold Venofer. [not on PO iron sec to stomach ulcer]-since patient had TAH.  #Etiology: Menorrhagia-s/p TAH.  # DISPOSITION: # NO venofer # 6 months for MD assessment, labs (CBC with diff, iron studies/ferritin- day before) and +/- Venofer- Dr.B.  

## 2021-06-08 NOTE — Progress Notes (Signed)
[place] OFFICE PROGRESS NOTE  Patient Care Team: Teodora Medici, DO as PCP - General (Internal Medicine) Bary Castilla, Forest Gleason, MD (General Surgery) Lequita Asal, MD (Inactive) as Referring Physician (Hematology and Oncology)   Cancer Staging  No matching staging information was found for the patient.   Oncology History   No history exists.    Latest Reference Range & Units 06/04/21 14:52  Iron 28 - 170 ug/dL 38  UIBC ug/dL 392  TIBC 250 - 450 ug/dL 430  Saturation Ratios 10.4 - 31.8 % 9 (L)  Ferritin 11 - 307 ng/mL 18  (L): Data is abnormally low   INTERVAL HISTORY:  Donna Reyes 46 y.o.  female pleasant patient above history of iron deficiency anemia likely secondary to chronic heavy menstrual losses is here for follow-up.  In the interim patient had TAH.  Unfortunately; had ileus postoperatively.   Energy levels are adequate.  Denies any nausea vomiting abdominal pain or blood in stools black or stools.    Review of Systems  Constitutional:  Negative for chills, diaphoresis, fever, malaise/fatigue and weight loss.  HENT:  Negative for nosebleeds and sore throat.   Eyes:  Negative for double vision.  Respiratory:  Negative for cough, hemoptysis, sputum production, shortness of breath and wheezing.   Cardiovascular:  Negative for chest pain, palpitations, orthopnea and leg swelling.  Gastrointestinal:  Negative for abdominal pain, blood in stool, constipation, diarrhea, heartburn, melena, nausea and vomiting.  Genitourinary:  Negative for dysuria, frequency and urgency.  Musculoskeletal:  Negative for back pain and joint pain.  Skin: Negative.  Negative for itching and rash.  Neurological:  Negative for dizziness, tingling, focal weakness, weakness and headaches.  Endo/Heme/Allergies:  Does not bruise/bleed easily.  Psychiatric/Behavioral:  Negative for depression. The patient is not nervous/anxious and does not have insomnia.       PAST MEDICAL HISTORY :  Past Medical History:  Diagnosis Date   Allergy    Anemia    Anxiety 03/05/2019   Asthma    BRCA negative 10/2019   MyRisk neg; IBIS=7.8%   Diabetes mellitus without complication ([place])    type 2   Family history of adverse reaction to anesthesia    Father - slow to wake   Family history of pancreatic cancer    GERD (gastroesophageal reflux disease)    Hypertension    Sleep apnea    Wears contact lenses     PAST SURGICAL HISTORY :   Past Surgical History:  Procedure Laterality Date   AXILLARY LYMPH NODE BIOPSY Right 05/25/2017   LYMPHADENITIS. NEGATIVE FOR MALIGNANCY.    BREAST BIOPSY Right 05/25/2017   neg/NEGATIVE FOR CARCINOMA. INFLAMMATION AND SQUAMOUS METAPLASIA OF A FOCAL DUCT    CESAREAN SECTION     2002,2006,2007   COLONOSCOPY WITH PROPOFOL N/A 06/23/2020   Procedure: COLONOSCOPY WITH PROPOFOL;  Surgeon: Virgel Manifold, MD;  Location: [facility];  Service: Endoscopy;  Laterality: N/A;  Diabetic  sleep apnea priority 4   CYSTOSCOPY N/A 05/04/2021   Procedure: CYSTOSCOPY;  Surgeon: Gae Dry, MD;  Location: [facility];  Service: Gynecology;  Laterality: N/A;   TOTAL LAPAROSCOPIC HYSTERECTOMY WITH SALPINGECTOMY Bilateral 05/04/2021   Procedure: TOTAL LAPAROSCOPIC HYSTERECTOMY WITH BILATERAL SALPINGECTOMY;  Surgeon: Gae Dry, MD;  Location: [facility];  Service: Gynecology;  Laterality: Bilateral;   TUBAL LIGATION  2007    FAMILY HISTORY :   Family History  Problem Relation Age of Onset   Diabetes Mother  Heart failure Mother    Kidney disease Mother    Diabetes Maternal Grandmother    Breast cancer Maternal Grandmother 62   Hypertension Father    Hyperlipidemia Father    Atrial fibrillation Sister    ADD / ADHD Son    Heart attack Maternal Grandfather    Diabetes Paternal Grandmother    Hypertension Paternal Grandmother    Prostate cancer Paternal Grandfather    Colon cancer Paternal Grandfather 29    Polycystic ovary syndrome Sister    Polycystic ovary syndrome Sister    Pancreatic cancer Paternal Aunt 62   Ovarian cancer Neg Hx     SOCIAL HISTORY:   Social History   Tobacco Use   Smoking status: Never   Smokeless tobacco: Never  Vaping Use   Vaping Use: Never used  Substance Use Topics   Alcohol use: No   Drug use: No    ALLERGIES:  is allergic to capsicum, basil oil, and lisinopril.  MEDICATIONS:  Current Outpatient Medications  Medication Sig Dispense Refill   albuterol (VENTOLIN HFA) 108 (90 Base) MCG/ACT inhaler Inhale 2 puffs into the lungs every 6 (six) hours as needed for wheezing or shortness of breath. May also use 2 puffs prior to exercise for EIB 8 g 5   Cholecalciferol (VITAMIN D3) 125 MCG (5000 UT) CAPS Take 5,000 Units by mouth daily.     doxycycline (VIBRAMYCIN) 100 MG capsule Take 1 capsule (100 mg total) by mouth 2 (two) times daily. 20 capsule 0   ipratropium-albuterol (DUONEB) 0.5-2.5 (3) MG/3ML SOLN TAKE 3 MLS BY NEBULIZATION 3 (THREE) TIMES DAILY AS NEEDED (ASTHMA EXACERBATION).     losartan-hydrochlorothiazide (HYZAAR) 100-12.5 MG tablet TAKE 1 TABLET BY MOUTH EVERY DAY (STOP LOSARTAN) 90 tablet 3   metFORMIN (GLUCOPHAGE) 500 MG tablet Take by mouth.     ondansetron (ZOFRAN-ODT) 4 MG disintegrating tablet Take 1 tablet (4 mg total) by mouth every 6 (six) hours as needed for nausea. 20 tablet 0   pantoprazole (PROTONIX) 20 MG tablet Take 20 mg by mouth daily.     rosuvastatin (CRESTOR) 10 MG tablet Take 1 tablet by mouth daily.     sucralfate (CARAFATE) 1 g tablet Take 1 g by mouth 4 (four) times daily.     SYMBICORT 160-4.5 MCG/ACT inhaler Inhale 2 puffs into the lungs in the morning and at bedtime. (Patient taking differently: Inhale 2 puffs into the lungs daily.) 10.2 g 11   No current facility-administered medications for this visit.    PHYSICAL EXAMINATION:  BP (!) 138/95    Pulse 72    Temp 98.7 F (37.1 C)    Resp 20    Wt 190 lb 3.2 oz (86.3  kg)    LMP 04/21/2021    SpO2 100%    BMI 34.79 kg/m   Filed Weights   06/08/21 1331  Weight: 190 lb 3.2 oz (86.3 kg)    Physical Exam Vitals and nursing note reviewed.  HENT:     Head: Normocephalic and atraumatic.     Mouth/Throat:     Pharynx: Oropharynx is clear.  Eyes:     Extraocular Movements: Extraocular movements intact.     Pupils: Pupils are equal, round, and reactive to light.  Cardiovascular:     Rate and Rhythm: Normal rate and regular rhythm.  Pulmonary:     Comments: Decreased breath sounds bilaterally.  Abdominal:     Palpations: Abdomen is soft.  Musculoskeletal:        General:  Normal range of motion.     Cervical back: Normal range of motion.  Skin:    General: Skin is warm.  Neurological:     General: No focal deficit present.     Mental Status: She is alert and oriented to person, place, and time.  Psychiatric:        Behavior: Behavior normal.        Judgment: Judgment normal.      LABORATORY DATA:  I have reviewed the data as listed    Component Value Date/Time   NA 140 05/10/2021 1045   NA 140 05/06/2014 0404   K 3.6 05/10/2021 1045   K 4.0 05/06/2014 0404   CL 104 05/10/2021 1045   CL 106 05/06/2014 0404   CO2 29 05/10/2021 1045   CO2 25 05/06/2014 0404   GLUCOSE 133 (H) 05/10/2021 1045   GLUCOSE 107 (H) 05/06/2014 0404   BUN 21 (H) 05/10/2021 1045   BUN 13 05/06/2014 0404   CREATININE 0.63 05/10/2021 1045   CREATININE 0.78 02/08/2021 0922   CALCIUM 8.0 (L) 05/10/2021 1045   CALCIUM 8.8 05/06/2014 0404   PROT 6.5 05/10/2021 1045   PROT 7.1 05/06/2014 0404   ALBUMIN 2.8 (L) 05/10/2021 1045   ALBUMIN 3.2 (L) 05/06/2014 0404   AST 24 05/10/2021 1045   AST 13 (L) 05/06/2014 0404   ALT 21 05/10/2021 1045   ALT 19 05/06/2014 0404   ALKPHOS 55 05/10/2021 1045   ALKPHOS 60 05/06/2014 0404   BILITOT 0.7 05/10/2021 1045   BILITOT 0.2 05/06/2014 0404   GFRNONAA >60 05/10/2021 1045   GFRNONAA 105 07/10/2020 0829   GFRAA 122  07/10/2020 0829    No results found for: SPEP, UPEP  Lab Results  Component Value Date   WBC 4.8 06/04/2021   NEUTROABS 2.0 06/04/2021   HGB 11.7 (L) 06/04/2021   HCT 36.6 06/04/2021   MCV 83.0 06/04/2021   PLT 360 06/04/2021      Chemistry      Component Value Date/Time   NA 140 05/10/2021 1045   NA 140 05/06/2014 0404   K 3.6 05/10/2021 1045   K 4.0 05/06/2014 0404   CL 104 05/10/2021 1045   CL 106 05/06/2014 0404   CO2 29 05/10/2021 1045   CO2 25 05/06/2014 0404   BUN 21 (H) 05/10/2021 1045   BUN 13 05/06/2014 0404   CREATININE 0.63 05/10/2021 1045   CREATININE 0.78 02/08/2021 0922      Component Value Date/Time   CALCIUM 8.0 (L) 05/10/2021 1045   CALCIUM 8.8 05/06/2014 0404   ALKPHOS 55 05/10/2021 1045   ALKPHOS 60 05/06/2014 0404   AST 24 05/10/2021 1045   AST 13 (L) 05/06/2014 0404   ALT 21 05/10/2021 1045   ALT 19 05/06/2014 0404   BILITOT 0.7 05/10/2021 1045   BILITOT 0.2 05/06/2014 0404       RADIOGRAPHIC STUDIES: I have personally reviewed the radiological images as listed and agreed with the findings in the report. No results found.   ASSESSMENT & PLAN:  Iron deficiency #  Iron deficiency anemia: Secondary history of heavy menstrual blood loss.  Hb11; ferritin 20.  Hold Venofer. [not on PO iron sec to stomach ulcer]-since patient had TAH.  #Etiology: Menorrhagia-s/p TAH.  # DISPOSITION: # NO venofer # 6 months for MD assessment, labs (CBC with diff, iron studies/ferritin- day before) and +/- Venofer- Dr.B.     No orders of the defined types were placed in this encounter.  All questions were answered. The patient knows to call the clinic with any problems, questions or concerns.      Cammie Sickle, MD 06/08/2021 1:45 PM

## 2021-06-14 ENCOUNTER — Other Ambulatory Visit: Payer: Self-pay

## 2021-06-14 ENCOUNTER — Ambulatory Visit (INDEPENDENT_AMBULATORY_CARE_PROVIDER_SITE_OTHER): Payer: No Typology Code available for payment source | Admitting: Gastroenterology

## 2021-06-14 ENCOUNTER — Encounter: Payer: Self-pay | Admitting: Gastroenterology

## 2021-06-14 VITALS — BP 126/84 | HR 76 | Temp 98.3°F | Ht 62.0 in | Wt 189.2 lb

## 2021-06-14 DIAGNOSIS — R1013 Epigastric pain: Secondary | ICD-10-CM

## 2021-06-14 DIAGNOSIS — G8929 Other chronic pain: Secondary | ICD-10-CM

## 2021-06-14 MED ORDER — PANTOPRAZOLE SODIUM 40 MG PO TBEC: DELAYED_RELEASE_TABLET | ORAL | 0 refills | Status: DC

## 2021-06-14 NOTE — Progress Notes (Incomplete)
Cephas Darby, MD [address]  [place]  [place], [place] [address]  Main: [phone]  Fax: [phone]    Gastroenterology Consultation  Referring Provider:     Delsa Grana, PA-C Primary Care Physician:  Teodora Medici, DO Primary Gastroenterologist:  Dr. Cephas Darby Reason for Consultation:     ***        HPI:   Donna Reyes is a 46 y.o. female referred by Dr. Teodora Medici, DO  for consultation & management of ***  NSAIDs: ***  Antiplts/Anticoagulants/Anti thrombotics: ***  GI Procedures: ***  Past Medical History:  Diagnosis Date   Allergy    Anemia    Anxiety 03/05/2019   Asthma    BRCA negative 10/2019   MyRisk neg; IBIS=7.8%   Diabetes mellitus without complication ([facility])    type 2   Family history of adverse reaction to anesthesia    Father - slow to wake   Family history of pancreatic cancer    GERD (gastroesophageal reflux disease)    Hypertension    Sleep apnea    Wears contact lenses     Past Surgical History:  Procedure Laterality Date   AXILLARY LYMPH NODE BIOPSY Right 05/25/2017   LYMPHADENITIS. NEGATIVE FOR MALIGNANCY.    BREAST BIOPSY Right 05/25/2017   neg/NEGATIVE FOR CARCINOMA. INFLAMMATION AND SQUAMOUS METAPLASIA OF A FOCAL DUCT    CESAREAN SECTION     2002,2006,2007   COLONOSCOPY WITH PROPOFOL N/A 06/23/2020   Procedure: COLONOSCOPY WITH PROPOFOL;  Surgeon: Virgel Manifold, MD;  Location: [facility];  Service: Endoscopy;  Laterality: N/A;  Diabetic  sleep apnea priority 4   CYSTOSCOPY N/A 05/04/2021   Procedure: CYSTOSCOPY;  Surgeon: Gae Dry, MD;  Location: [facility];  Service: Gynecology;  Laterality: N/A;   TOTAL LAPAROSCOPIC HYSTERECTOMY WITH SALPINGECTOMY Bilateral 05/04/2021   Procedure: TOTAL LAPAROSCOPIC HYSTERECTOMY WITH BILATERAL SALPINGECTOMY;  Surgeon: Gae Dry, MD;  Location: [facility];  Service: Gynecology;  Laterality: Bilateral;   TUBAL LIGATION  2007    Current Outpatient Medications:    albuterol (VENTOLIN HFA) 108 (90 Base) MCG/ACT inhaler, Inhale 2 puffs into the lungs every 6 (six) hours as needed for wheezing or shortness of breath. May also use 2 puffs prior to exercise for EIB, Disp: 8 g, Rfl: 5   Cholecalciferol (VITAMIN D3) 125 MCG (5000 UT) CAPS, Take 5,000 Units by mouth daily., Disp: , Rfl:    losartan-hydrochlorothiazide (HYZAAR) 100-12.5 MG tablet, TAKE 1 TABLET BY MOUTH EVERY DAY (STOP LOSARTAN), Disp: 90 tablet, Rfl: 3   metFORMIN (GLUCOPHAGE) 500 MG tablet, Take by mouth., Disp: , Rfl:    pantoprazole (PROTONIX) 40 MG tablet, Take 1 tablet (40 mg total) by mouth 2 (two) times daily before a meal for 30 days, THEN 1 tablet (40 mg total) daily before breakfast., Disp: 120 tablet, Rfl: 0   rosuvastatin (CRESTOR) 10 MG tablet, Take 1 tablet by mouth daily., Disp: , Rfl:    SYMBICORT 160-4.5 MCG/ACT inhaler, Inhale 2 puffs into the lungs in the morning and at bedtime. (Patient taking differently: Inhale 2 puffs into the lungs daily.), Disp: 10.2 g, Rfl: 11    Family History  Problem Relation Age of Onset   Diabetes Mother    Heart failure Mother    Kidney disease Mother    Diabetes Maternal Grandmother    Breast cancer Maternal Grandmother 68   Hypertension Father    Hyperlipidemia Father    Atrial fibrillation  Sister    ADD / ADHD Son    Heart attack Maternal Grandfather    Diabetes Paternal Grandmother    Hypertension Paternal Grandmother    Prostate cancer Paternal Grandfather    Colon cancer Paternal Grandfather 60   Polycystic ovary syndrome Sister    Polycystic ovary syndrome Sister    Pancreatic cancer Paternal Aunt 46   Ovarian cancer Neg Hx      Social History   Tobacco Use   Smoking status: Never   Smokeless tobacco: Never  Vaping Use   Vaping Use: Never used  Substance Use Topics   Alcohol use: No   Drug use: No    Allergies as of 06/14/2021 - Review Complete 06/14/2021  Allergen Reaction  Noted   Capsicum Shortness Of Breath 06/16/2020   Basil oil Swelling 06/16/2020   Lisinopril  08/08/2015    Review of Systems:    All systems reviewed and negative except where noted in HPI.   Physical Exam:  BP 126/84 (BP Location: Left Arm, Patient Position: Sitting, Cuff Size: Normal)    Pulse 76    Temp 98.3 F (36.8 C) (Oral)    Ht '5\' 2"'  (1.575 m)    Wt 189 lb 4 oz (85.8 kg)    LMP 04/21/2021    BMI 34.61 kg/m  Patient's last menstrual period was 04/21/2021.  General:   Alert,  Well-developed, well-nourished, pleasant and cooperative in NAD Head:  Normocephalic and atraumatic. Eyes:  Sclera clear, no icterus.   Conjunctiva pink. Ears:  Normal auditory acuity. Nose:  No deformity, discharge, or lesions. Mouth:  No deformity or lesions,oropharynx pink & moist. Neck:  Supple; no masses or thyromegaly. Lungs:  Respirations even and unlabored.  Clear throughout to auscultation.   No wheezes, crackles, or rhonchi. No acute distress. Heart:  Regular rate and rhythm; no murmurs, clicks, rubs, or gallops. Abdomen:  Normal bowel sounds. Soft, non-tender and non-distended without masses, hepatosplenomegaly or hernias noted.  No guarding or rebound tenderness.   Rectal: Not performed Msk:  Symmetrical without gross deformities. Good, equal movement & strength bilaterally. Pulses:  Normal pulses noted. Extremities:  No clubbing or edema.  No cyanosis. Neurologic:  Alert and oriented x3;  grossly normal neurologically. Skin:  Intact without significant lesions or rashes. No jaundice. Lymph Nodes:  No significant cervical adenopathy. Psych:  Alert and cooperative. Normal mood and affect.  Imaging Studies: ***  Assessment and Plan:   Donna Reyes is a 46 y.o. female with ***   Follow up in ***   Cephas Darby, MD

## 2021-06-15 ENCOUNTER — Encounter: Payer: Self-pay | Admitting: Gastroenterology

## 2021-06-15 ENCOUNTER — Encounter: Payer: Self-pay | Admitting: Internal Medicine

## 2021-06-16 LAB — H. PYLORI BREATH TEST: H pylori Breath Test: NEGATIVE

## 2021-06-17 ENCOUNTER — Ambulatory Visit (INDEPENDENT_AMBULATORY_CARE_PROVIDER_SITE_OTHER): Payer: No Typology Code available for payment source | Admitting: Internal Medicine

## 2021-06-17 ENCOUNTER — Encounter: Payer: Self-pay | Admitting: Internal Medicine

## 2021-06-17 VITALS — BP 136/82 | HR 100 | Temp 98.2°F | Resp 16 | Ht 62.0 in | Wt 191.2 lb

## 2021-06-17 DIAGNOSIS — I10 Essential (primary) hypertension: Secondary | ICD-10-CM | POA: Diagnosis not present

## 2021-06-17 DIAGNOSIS — J4520 Mild intermittent asthma, uncomplicated: Secondary | ICD-10-CM | POA: Diagnosis not present

## 2021-06-17 DIAGNOSIS — E11628 Type 2 diabetes mellitus with other skin complications: Secondary | ICD-10-CM

## 2021-06-17 DIAGNOSIS — E785 Hyperlipidemia, unspecified: Secondary | ICD-10-CM

## 2021-06-17 DIAGNOSIS — R1013 Epigastric pain: Secondary | ICD-10-CM

## 2021-06-17 DIAGNOSIS — E1169 Type 2 diabetes mellitus with other specified complication: Secondary | ICD-10-CM

## 2021-06-17 DIAGNOSIS — J302 Other seasonal allergic rhinitis: Secondary | ICD-10-CM

## 2021-06-17 DIAGNOSIS — B372 Candidiasis of skin and nail: Secondary | ICD-10-CM

## 2021-06-17 MED ORDER — NYSTATIN 100000 UNIT/GM EX POWD: 1.0000 "application " | Freq: Three times a day (TID) | CUTANEOUS | 0 refills | Status: AC

## 2021-06-17 MED ORDER — METFORMIN HCL 500 MG PO TABS: 500.0000 mg | ORAL_TABLET | Freq: Two times a day (BID) | ORAL | 3 refills | Status: AC

## 2021-06-17 NOTE — Progress Notes (Signed)
? ?Established Patient Office Visit ? ?Subjective:  ?Patient ID: Donna Reyes, female    DOB: 1975/11/28  Age: 46 y.o. MRN: [id number] ? ?CC:  ?Chief Complaint  ?Patient presents with  ? Hypertension  ?  Started back on norvasc 35m 2 days ago  ? ? ?HPI ?[name] for follow up on chronic medical conditions. Recently had a hysterectomy 2 weeks ago followed by an ileus but is doing well now. BM normal. Hysterectomy from fibroids. ? ?Hypertension: ?-Medications: Losartan-HCTZ 100-12.5, Amlodipine 5 mg (just started 2 days ago) ?-Patient is compliant with above medications and reports no side effects. ?-Checking BP at home (average): before meds 140/95; 135/80's after starting new med  ?-Denies any SOB, CP, vision changes, LE edema or symptoms of hypotension ?-Diet: decreased sodium, increased water  ? ?HLD: ?-Medications: Crestor 10 ?-Patient is compliant with above medications and reports no side effects.  ?-Last lipid panel: Lipid Panel  ?   ?Component Value Date/Time  ? CHOL 212 (H) 11/10/2020 1024  ? TRIG 164 (H) 11/10/2020 1024  ? HDL 38 (L) 11/10/2020 1024  ? CHOLHDL 5.6 (H) 11/10/2020 1024  ? LDLCALC 144 (H) 11/10/2020 1024  ? ?The 10-year ASCVD risk score (Arnett DK, et al., 2019) is: 11.5% ?  Values used to calculate the score: ?    Age: [age]years ?    Sex: Female ?    Is Non-Hispanic African American: Yes ?    Diabetic: Yes ?    Tobacco smoker: No ?    Systolic Blood Pressure: 1132mmHg ?    Is BP treated: Yes ?    HDL Cholesterol: 38 mg/dL ?    Total Cholesterol: 212 mg/dL ? ?Diabetes, Type 2: ?-Last A1c 2/23 6.0% ?-Medications: Metformin 500, had been on Ozempic but discontinued due to abdominal pain   ?-Patient is compliant with the above medications and reports no side effects.  ?-Eye exam: 9/22 ?-Foot exam: 9/22 ?-Microalbumin: 2021, no issues on ARB ?-Statin: yes ?-PNA vaccine: UTD ?-Denies symptoms of hypoglycemia, polyuria, polydipsia, numbness extremities, foot  ulcers/trauma.  ? ?Asthma:  ?-Seeing Pulmonology  ?-Asthma status: controlled ?-Current Treatments: Symbicort, Albuterol  ?-Satisfied with current treatment?: yes ?-Albuterol/rescue inhaler frequency: twice in the last week due to allergies  ?-Limitation of activity: no ?-Current upper respiratory symptoms: no ?-Pneumovax: Up to Date ?-Influenza: Up to Date ? ?Epigastric Pain: ?-Had hysterectomy beginning of February 2022 and ileus post-operatively  ?-Following with GI, recommend EGD but patient postponed due to fiances   ?-Currently on Protonix 40 mg daily, just started this dose yesterday  ? ?Seasonal Allergies: ?-Having some yellow eye discharge on right eye in the mornings ?-No vision changes, eye pain, etc.  ?-Not currently taking anything for allergies  ? ?Rash: ?-Under breasts, worse in the summer ?-Nystatin powder helps, cream makes it worse ? ?Health Maintenance: ?-Blood work up to date ?-Mammogram 3/22, due soon, will schedule  ?-Colonoscopy 3/22 repeat in 10 years ? ? ?Past Medical History:  ?Diagnosis Date  ? Allergy   ? Anemia   ? Anxiety 03/05/2019  ? Asthma   ? BRCA negative 10/2019  ? MyRisk neg; IBIS=7.8%  ? [place]   ? type 2  ? Family history of adverse reaction to anesthesia   ? Father - slow to wake  ? Family history of pancreatic cancer   ? GERD (gastroesophageal reflux disease)   ? Hypertension   ? Ileus following gastrointestinal surgery ([facility])   ?  Menorrhagia with regular cycle 08/14/2018  ? Sleep apnea   ? Small bowel obstruction ([place]) 05/06/2021  ? Wears contact lenses   ? ? ?Past Surgical History:  ?Procedure Laterality Date  ? AXILLARY LYMPH NODE BIOPSY Right 05/25/2017  ? LYMPHADENITIS. NEGATIVE FOR MALIGNANCY.   ? BREAST BIOPSY Right 05/25/2017  ? neg/NEGATIVE FOR CARCINOMA. INFLAMMATION AND SQUAMOUS METAPLASIA OF A FOCAL DUCT   ? CESAREAN SECTION    ? 2002,2006,2007  ? COLONOSCOPY WITH PROPOFOL N/A 06/23/2020  ? Procedure: COLONOSCOPY WITH PROPOFOL;   Surgeon: Virgel Manifold, MD;  Location: [facility];  Service: Endoscopy;  Laterality: N/A;  Diabetic  ?sleep apnea ?priority 4  ? CYSTOSCOPY N/A 05/04/2021  ? Procedure: CYSTOSCOPY;  Surgeon: Gae Dry, MD;  Location: [facility];  Service: Gynecology;  Laterality: N/A;  ? TOTAL LAPAROSCOPIC HYSTERECTOMY WITH SALPINGECTOMY Bilateral 05/04/2021  ? Procedure: TOTAL LAPAROSCOPIC HYSTERECTOMY WITH BILATERAL SALPINGECTOMY;  Surgeon: Gae Dry, MD;  Location: [facility];  Service: Gynecology;  Laterality: Bilateral;  ? TUBAL LIGATION  2007  ? ? ?Family History  ?Problem Relation Age of Onset  ? Diabetes Mother   ? Heart failure Mother   ? Kidney disease Mother   ? Diabetes Maternal Grandmother   ? Breast cancer Maternal Grandmother 24  ? Hypertension Father   ? Hyperlipidemia Father   ? Atrial fibrillation Sister   ? ADD / ADHD Son   ? Heart attack Maternal Grandfather   ? Diabetes Paternal Grandmother   ? Hypertension Paternal Grandmother   ? Prostate cancer Paternal Grandfather   ? Colon cancer Paternal Grandfather 80  ? Polycystic ovary syndrome Sister   ? Polycystic ovary syndrome Sister   ? Pancreatic cancer Paternal Aunt 56  ? Ovarian cancer Neg Hx   ? ? ?Social History  ? ?Socioeconomic History  ? Marital status: Legally Separated  ?  Spouse name: Donna Reyes  ? Number of children: 5  ? Years of education: 20  ? Highest education level: Associate degree: academic program  ?Occupational History  ? Not on file  ?Tobacco Use  ? Smoking status: Never  ? Smokeless tobacco: Never  ?Vaping Use  ? Vaping Use: Never used  ?Substance and Sexual Activity  ? Alcohol use: No  ? Drug use: No  ? Sexual activity: Yes  ?  Partners: Male  ?  Birth control/protection: Surgical  ?  Comment: tubal ligation   ?Other Topics Concern  ? Not on file  ?Social History Narrative  ? Not on file  ? ?Social Determinants of Health  ? ?Financial Resource Strain: Not on file  ?Food Insecurity: Not on file  ?Transportation Needs: Not on  file  ?Physical Activity: Not on file  ?Stress: Not on file  ?Social Connections: Not on file  ?Intimate Partner Violence: Not on file  ? ? ?Outpatient Medications Prior to Visit  ?Medication Sig Dispense Refill  ? albuterol (VENTOLIN HFA) 108 (90 Base) MCG/ACT inhaler Inhale 2 puffs into the lungs every 6 (six) hours as needed for wheezing or shortness of breath. May also use 2 puffs prior to exercise for EIB 8 g 5  ? Cholecalciferol (VITAMIN D3) 125 MCG (5000 UT) CAPS Take 5,000 Units by mouth daily.    ? losartan-hydrochlorothiazide (HYZAAR) 100-12.5 MG tablet TAKE 1 TABLET BY MOUTH EVERY DAY (STOP LOSARTAN) 90 tablet 3  ? metFORMIN (GLUCOPHAGE) 500 MG tablet Take by mouth.    ? pantoprazole (PROTONIX) 40 MG tablet Take 1 tablet (40 mg total)  by mouth 2 (two) times daily before a meal for 30 days, THEN 1 tablet (40 mg total) daily before breakfast. 120 tablet 0  ? rosuvastatin (CRESTOR) 10 MG tablet Take 1 tablet by mouth daily.    ? SYMBICORT 160-4.5 MCG/ACT inhaler Inhale 2 puffs into the lungs in the morning and at bedtime. (Patient taking differently: Inhale 2 puffs into the lungs daily.) 10.2 g 11  ? ?No facility-administered medications prior to visit.  ? ? ?Allergies  ?Allergen Reactions  ? Capsicum Shortness Of Breath  ?  Jalapeno peppers  ? Basil Oil Swelling  ?  Tongue swelling  ? Lisinopril   ?  fatigue  ? ? ?ROS ?Review of Systems  ?Constitutional:  Negative for chills and fever.  ?Eyes:  Positive for discharge and itching. Negative for pain, redness and visual disturbance.  ?Respiratory:  Negative for cough and shortness of breath.   ?Cardiovascular:  Negative for chest pain.  ?Gastrointestinal:  Negative for abdominal pain, diarrhea, nausea and vomiting.  ?Skin:  Positive for rash.  ?Neurological:  Negative for dizziness and headaches.  ? ?  ?Objective:  ?  ?Physical Exam ?Constitutional:   ?   Appearance: Normal appearance.  ?HENT:  ?   Head: Normocephalic and atraumatic.  ?Eyes:  ?   Extraocular  Movements: Extraocular movements intact.  ?   Conjunctiva/sclera: Conjunctivae normal.  ?   Pupils: Pupils are equal, round, and reactive to light.  ?Cardiovascular:  ?   Rate and Rhythm: Normal rate and reg

## 2021-06-17 NOTE — Assessment & Plan Note (Signed)
Recommend allergy eye drops, oral anti-histamine and Flonase to help control symptoms.  ?

## 2021-06-17 NOTE — Assessment & Plan Note (Signed)
Stable, continue current medications.  

## 2021-06-17 NOTE — Assessment & Plan Note (Signed)
Stable, continue statin. Crestor new since last lipid panel.  ? ?

## 2021-06-17 NOTE — Assessment & Plan Note (Signed)
On Protonix, following with GI. ?

## 2021-06-17 NOTE — Assessment & Plan Note (Signed)
Increase Metformin to 500 mg BID.  ?

## 2021-06-17 NOTE — Assessment & Plan Note (Signed)
Overall stable, allergies worsening asthma at night, had to use Albuterol twice last week.  ?

## 2021-06-17 NOTE — Patient Instructions (Addendum)
It was great seeing you today! ? ?Plan discussed at today's visit: ?-Blood work ordered today, results will be uploaded to [facility].  ?-For eye allergies, can use Zaditor eye drops (can get on [employer]), also recommend Flonase (nasal spray) and oral anti-histamines like Claritin, Allegra or Zyretc ?-Nystatin powder sent  ?-Increase Metformin 500 to twice a day, can cause some diarrhea at first but this should subside  ?-Schedule mammogram  ? ?Follow up in: keep appointment in August  ? ?Take care and let us know if you have any questions or concerns prior to your next visit. ? ?Dr. Rosana Berger ? ?

## 2021-06-18 ENCOUNTER — Encounter: Payer: Self-pay | Admitting: Obstetrics & Gynecology

## 2021-06-18 ENCOUNTER — Ambulatory Visit (INDEPENDENT_AMBULATORY_CARE_PROVIDER_SITE_OTHER): Payer: No Typology Code available for payment source | Admitting: Obstetrics & Gynecology

## 2021-06-18 ENCOUNTER — Other Ambulatory Visit: Payer: Self-pay

## 2021-06-18 VITALS — BP 120/80 | Ht 62.0 in | Wt 190.0 lb

## 2021-06-18 DIAGNOSIS — Z90710 Acquired absence of both cervix and uterus: Secondary | ICD-10-CM

## 2021-06-18 NOTE — Progress Notes (Signed)
?  Postoperative Follow-up ?Patient presents post op from  [facility]  for fibroids, 6 weeks ago. ? ?Subjective: ?Patient reports marked improvement in her preop symptoms. Eating a regular diet without difficulty. The patient is not having any pain.  Activity: normal activities of daily living. Patient reports additional symptom's since surgery of None. ? ?Objective: ?BP 120/80   Ht '5\' 2"'$  (1.575 m)   Wt 190 lb (86.2 kg)   LMP 04/21/2021   BMI 34.75 kg/m?  ?Physical Exam ?Constitutional:   ?   General: She is not in acute distress. ?   Appearance: She is well-developed.  ?Genitourinary:  ?   Rectum normal.  ?   Genitourinary Comments: Cervix and uterus absent. ?Vaginal cuff healing well.  ?   No vaginal erythema or bleeding.  ? ?   Right Adnexa: not tender and no mass present. ?   Left Adnexa: not tender and no mass present. ?   Cervix is absent.  ?   Uterus is absent.  ?Cardiovascular:  ?   Rate and Rhythm: Normal rate.  ?Pulmonary:  ?   Effort: Pulmonary effort is normal.  ?Abdominal:  ?   General: There is no distension.  ?   Palpations: Abdomen is soft.  ?   Tenderness: There is no abdominal tenderness.  ?   Comments: Incision healing well.  ?Musculoskeletal:     ?   General: Normal range of motion.  ?Neurological:  ?   Mental Status: She is alert and oriented to person, place, and time.  ?   Cranial Nerves: No cranial nerve deficit.  ?Skin: ?   General: Skin is warm and dry.  ? ? ?Assessment: ?s/p :  total laparoscopic hysterectomy with bilateral salpingectomy progressing well ? ?Plan: ?Patient has done well after surgery with no apparent complications.  I have discussed the post-operative course to date, and the expected progress moving forward.  The patient understands what complications to be concerned about.  I will see the patient in routine follow up, or sooner if needed.   ? ?Activity plan: No restriction. ? ?Hoyt Koch ?06/18/2021, 3:42 PM ? ? ?

## 2021-06-21 ENCOUNTER — Other Ambulatory Visit: Payer: Self-pay | Admitting: Internal Medicine

## 2021-06-21 DIAGNOSIS — Z1231 Encounter for screening mammogram for malignant neoplasm of breast: Secondary | ICD-10-CM

## 2021-06-25 ENCOUNTER — Encounter: Payer: Self-pay | Admitting: Internal Medicine

## 2021-06-29 ENCOUNTER — Encounter: Payer: Self-pay | Admitting: Internal Medicine

## 2021-07-04 ENCOUNTER — Other Ambulatory Visit: Payer: Self-pay | Admitting: Family Medicine

## 2021-07-04 DIAGNOSIS — E1169 Type 2 diabetes mellitus with other specified complication: Secondary | ICD-10-CM

## 2021-07-05 MED ORDER — ROSUVASTATIN CALCIUM 10 MG PO TABS: 10.0000 mg | ORAL_TABLET | Freq: Every day | ORAL | 3 refills | Status: AC

## 2021-07-05 NOTE — Addendum Note (Signed)
Addended by: Teodora Medici on: 07/05/2021 09:09 AM ? ? Modules accepted: Orders ? ?

## 2021-07-19 ENCOUNTER — Encounter: Payer: Self-pay | Admitting: Internal Medicine

## 2021-07-26 ENCOUNTER — Telehealth: Payer: Self-pay

## 2021-07-26 ENCOUNTER — Ambulatory Visit: Payer: Self-pay | Admitting: *Deleted

## 2021-07-26 ENCOUNTER — Ambulatory Visit: Payer: Self-pay

## 2021-07-26 NOTE — Telephone Encounter (Signed)
?  Chief Complaint: abdominal pain ?Symptoms: lower abdominal pain, bloating, constant ?Frequency: 3 days ?Pertinent Negatives: Patient denies back pain, diarrhea, fever, urination pain, vomiting ?Disposition: '[]'$ ED /'[x]'$ Urgent Care (no appt availability in office) / '[]'$ Appointment(In office/virtual)/ '[]'$  [facility]/ '[]'$ Home Care/ '[]'$ Refused Recommended Disposition /'[]'$ [facility]/ '[]'$  Follow-up with PCP ?Additional Notes: No open appointment- advised UC due to work schedule   ?

## 2021-07-26 NOTE — Telephone Encounter (Signed)
[facility] ?

## 2021-07-26 NOTE — Telephone Encounter (Signed)
Reason for Disposition ? [1] MILD-MODERATE pain AND [2] constant AND [3] present > 2 hours ? ?Answer Assessment - Initial Assessment Questions ?1. LOCATION: "Where does it hurt?"  ?    Lower abdomen ?2. RADIATION: "Does the pain shoot anywhere else?" (e.g., chest, back) ?    no ?3. ONSET: "When did the pain begin?" (e.g., minutes, hours or days ago)  ?    3 days ?4. SUDDEN: "Gradual or sudden onset?" ?    gradual ?5. PATTERN "Does the pain come and go, or is it constant?" ?   - If constant: "Is it getting better, staying the same, or worsening?"  ?    (Note: Constant means the pain never goes away completely; most serious pain is constant and it progresses)  ?   - If intermittent: "How long does it last?" "Do you have pain now?" ?    (Note: Intermittent means the pain goes away completely between bouts) ?    constant ?6. SEVERITY: "How bad is the pain?"  (e.g., Scale 1-10; mild, moderate, or severe) ?  - MILD (1-3): doesn't interfere with normal activities, abdomen soft and not tender to touch  ?  - MODERATE (4-7): interferes with normal activities or awakens from sleep, abdomen tender to touch  ?  - SEVERE (8-10): excruciating pain, doubled over, unable to do any normal activities  ?    moderate ?7. RECURRENT SYMPTOM: "Have you ever had this type of stomach pain before?" If Yes, ask: "When was the last time?" and "What happened that time?"  ?    Yes- postsurgical ?8. CAUSE: "What do you think is causing the stomach pain?" ?    Seeds in strawberries ?9. RELIEVING/AGGRAVATING FACTORS: "What makes it better or worse?" (e.g., movement, antacids, bowel movement) ?    resting ?10. OTHER SYMPTOMS: "Do you have any other symptoms?" (e.g., back pain, diarrhea, fever, urination pain, vomiting) ?      no ?11. PREGNANCY: "Is there any chance you are pregnant?" "When was your last menstrual period?" ? ?Protocols used: Abdominal Pain - Female-A-AH ? ?
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
## 2021-07-27 ENCOUNTER — Ambulatory Visit
Admission: RE | Admit: 2021-07-27 | Discharge: 2021-07-27 | Disposition: A | Discharge status: Home / Self Care | Payer: No Typology Code available for payment source | Source: Ambulatory Visit | Attending: Emergency Medicine | Admitting: Emergency Medicine

## 2021-07-27 VITALS — BP 127/81 | HR 81 | Temp 98.3°F | Resp 18

## 2021-07-27 DIAGNOSIS — R141 Gas pain: Secondary | ICD-10-CM | POA: Diagnosis not present

## 2021-07-27 MED ORDER — LIDOCAINE VISCOUS HCL 2 % MT SOLN
15.0000 mL | Freq: Once | OROMUCOSAL | Status: AC
  Administered 2021-07-27: 15 mL via ORAL

## 2021-07-27 MED ORDER — ALUMINUM-MAGNESIUM-SIMETHICONE 200-200-20 MG/5ML PO SUSP: 30.0000 mL | Freq: Three times a day (TID) | ORAL | 0 refills | Status: AC

## 2021-07-27 MED ORDER — ALUM & MAG HYDROXIDE-SIMETH 200-200-20 MG/5ML PO SUSP
30.0000 mL | Freq: Once | ORAL | Status: AC
  Administered 2021-07-27: 30 mL via ORAL

## 2021-07-27 MED ORDER — ONDANSETRON 4 MG PO TBDP: 4.0000 mg | ORAL_TABLET | Freq: Three times a day (TID) | ORAL | 0 refills | Status: AC | PRN

## 2021-07-27 NOTE — ED Provider Notes (Signed)
?[facility] ? ? ? ?CSN: [id number] ?Arrival date & time: 07/27/21  3716 ? ? ?  ? ?History   ?Chief Complaint ?Chief Complaint  ?Patient presents with  ? Abdominal Pain  ?  Entered by patient  ? ? ?HPI ?Donna Reyes is a 46 y.o. female.  ? ?Patient presents with intermittent epigastric abdominal pain occurring for 5 days after drinking a strawberry milkshake.  Endorses that she has had flareups ever since having a total hysterectomy which caused an ileus.  Associated bloating, increased gas production and nausea without vomiting.  Has been able to tolerate food and liquids.  Last bowel movement 1 day ago.  Denies diarrhea, heartburn or indigestion, foul taste in mouth, fever, chills, upper respiratory symptoms. ? ?Past Medical History:  ?Diagnosis Date  ? Allergy   ? Anemia   ? Anxiety 03/05/2019  ? Asthma   ? BRCA negative 10/2019  ? MyRisk neg; IBIS=7.8%  ? Diabetes mellitus without complication ([place])   ? type 2  ? Family history of adverse reaction to anesthesia   ? Father - slow to wake  ? Family history of pancreatic cancer   ? GERD (gastroesophageal reflux disease)   ? Hypertension   ? Ileus following gastrointestinal surgery (HCC)   ? Menorrhagia with regular cycle 08/14/2018  ? Sleep apnea   ? Small bowel obstruction ([place]) 05/06/2021  ? Wears contact lenses   ? ? ?Patient Active Problem List  ? Diagnosis Date Noted  ? S/P laparoscopic hysterectomy 05/17/2021  ? Pelvic pain   ? Abdominal pain 02/08/2021  ? Leiomyoma 01/07/2021  ? Abnormal uterine bleeding (AUB) 01/07/2021  ? Trichomoniasis 01/07/2021  ? BV (bacterial vaginosis) 01/07/2021  ? Iron deficiency 12/08/2020  ? Seasonal allergies 07/22/2020  ? Class 2 severe obesity with serious comorbidity and body mass index (BMI) of 37.0 to 37.9 in adult [facility]) 03/10/2020  ? Hyperlipidemia associated with type 2 diabetes mellitus ([place]) 03/10/2020  ? Family history of pancreatic cancer 10/10/2019  ? Right ovarian cyst 10/10/2019  ? 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 Lymphadenopathy, axillary 10/27/2017  ? Essential hypertension 01/06/2016  ? Asthma 01/06/2016  ? Iron deficiency anemia 12/30/2015  ? ? ?Past Surgical History:  ?Procedure Laterality Date  ? ABDOMINAL HYSTERECTOMY    ? AXILLARY LYMPH NODE BIOPSY Right 05/25/2017  ? LYMPHADENITIS. NEGATIVE FOR MALIGNANCY.   ? BREAST BIOPSY Right 05/25/2017  ? neg/NEGATIVE FOR CARCINOMA. INFLAMMATION AND SQUAMOUS METAPLASIA OF A FOCAL DUCT   ? CESAREAN SECTION    ? 2002,2006,2007  ? COLONOSCOPY WITH PROPOFOL N/A 06/23/2020  ? Procedure: COLONOSCOPY WITH PROPOFOL;  Surgeon: Virgel Manifold, MD;  Location: [facility];  Service: Endoscopy;  Laterality: N/A;  Diabetic  ?sleep apnea ?priority 4  ? CYSTOSCOPY N/A 05/04/2021  ? Procedure: CYSTOSCOPY;  Surgeon: Gae Dry, MD;  Location: [facility];  Service: Gynecology;  Laterality: N/A;  ? TOTAL LAPAROSCOPIC HYSTERECTOMY WITH SALPINGECTOMY Bilateral 05/04/2021  ? Procedure: TOTAL LAPAROSCOPIC HYSTERECTOMY WITH BILATERAL SALPINGECTOMY;  Surgeon: Gae Dry, MD;  Location: [facility];  Service: Gynecology;  Laterality: Bilateral;  ? TUBAL LIGATION  2007  ? ? ?OB History   ? ? Gravida  ?5  ? Para  ?5  ? Term  ?5  ? Preterm  ?   ? AB  ?   ? Living  ?5  ?  ? ? SAB  ?   ? IAB  ?   ?  Ectopic  ?   ? Multiple  ?   ? Live Births  ?5  ?   ?  ? Obstetric Comments  ?1st Menstrual Cycle:  13 ?1st Pregnancy:  14 ?  ?  ? ?  ? ? ? ?Home Medications   ? ?Prior to Admission medications   ?Medication Sig Start Date End Date Taking? Authorizing Provider  ?albuterol (VENTOLIN HFA) 108 (90 Base) MCG/ACT inhaler Inhale 2 puffs into the lungs every 6 (six) hours as needed for wheezing or shortness of breath. May also use 2 puffs prior to exercise for EIB 05/21/21  Yes Teodora Medici, DO  ?aluminum-magnesium hydroxide-simethicone (MAALOX) 200-200-20 MG/5ML SUSP Take 30 mLs by mouth 4 (four) times daily -  before meals and at  bedtime. 07/27/21  Yes Cythnia Osmun R, NP  ?amLODipine (NORVASC) 5 MG tablet Take 5 mg by mouth daily. 06/14/21  Yes [provider]  ?Cholecalciferol (VITAMIN D3) 125 MCG (5000 UT) CAPS Take 5,000 Units by mouth daily.   Yes [provider]  ?losartan-hydrochlorothiazide (HYZAAR) 100-12.5 MG tablet TAKE 1 TABLET BY MOUTH EVERY DAY (STOP LOSARTAN) 07/14/20  Yes Delsa Grana, PA-C  ?metFORMIN (GLUCOPHAGE) 500 MG tablet Take 1 tablet (500 mg total) by mouth 2 (two) times daily with a meal. 06/17/21  Yes Teodora Medici, DO  ?pantoprazole (PROTONIX) 40 MG tablet Take 1 tablet (40 mg total) by mouth 2 (two) times daily before a meal for 30 days, THEN 1 tablet (40 mg total) daily before breakfast. 06/14/21 09/12/21 Yes Vanga, Tally Due, MD  ?rosuvastatin (CRESTOR) 10 MG tablet Take 1 tablet (10 mg total) by mouth daily. 07/05/21  Yes Teodora Medici, DO  ?SYMBICORT 160-4.5 MCG/ACT inhaler Inhale 2 puffs into the lungs in the morning and at bedtime. ?Patient taking differently: Inhale 2 puffs into the lungs daily. 11/10/20  Yes Tyler Pita, MD  ?nystatin (MYCOSTATIN/NYSTOP) powder Apply 1 application. topically 3 (three) times daily. 06/17/21   Teodora Medici, DO  ? ? ?Family History ?Family History  ?Problem Relation Age of Onset  ? Diabetes Mother   ? Heart failure Mother   ? Kidney disease Mother   ? Diabetes Maternal Grandmother   ? Breast cancer Maternal Grandmother 49  ? Hypertension Father   ? Hyperlipidemia Father   ? Atrial fibrillation Sister   ? ADD / ADHD Son   ? Heart attack Maternal Grandfather   ? Diabetes Paternal Grandmother   ? Hypertension Paternal Grandmother   ? Prostate cancer Paternal Grandfather   ? Colon cancer Paternal Grandfather 79  ? Polycystic ovary syndrome Sister   ? Polycystic ovary syndrome Sister   ? Pancreatic cancer Paternal Aunt 44  ? Ovarian cancer Neg Hx   ? ? ?Social History ?Social History  ? ?Tobacco Use  ? Smoking status: Never  ? Smokeless tobacco:  Never  ?Vaping Use  ? Vaping Use: Never used  ?Substance Use Topics  ? Alcohol use: No  ? Drug use: No  ? ? ? ?Allergies   ?Capsicum, Basil oil, and Lisinopril ? ? ?Review of Systems ?Review of Systems  ?Constitutional: Negative.   ?Respiratory: Negative.    ?Cardiovascular: Negative.   ?Gastrointestinal:  Positive for abdominal pain and nausea. Negative for abdominal distention, anal bleeding, blood in stool, constipation, diarrhea, rectal pain and vomiting.  ? ? ?Physical Exam ?Triage Vital Signs ?ED Triage Vitals  ?Enc Vitals Group  ?   BP 07/27/21 0920 127/81  ?   Pulse Rate 07/27/21 0920 81  ?  Resp 07/27/21 0920 18  ?   Temp 07/27/21 0920 98.3 ?F (36.8 ?C)  ?   Temp Source 07/27/21 0920 Oral  ?   SpO2 07/27/21 0920 97 %  ?   Weight --   ?   Height --   ?   Head Circumference --   ?   Peak Flow --   ?   Pain Score 07/27/21 0918 5  ?   Pain Loc --   ?   Pain Edu? --   ?   Excl. in [place]? --   ? ?No data found. ? ?Updated Vital Signs ?BP 127/81 (BP Location: Left Arm)   Pulse 81   Temp 98.3 ?F (36.8 ?C) (Oral)   Resp 18   LMP 04/21/2021   SpO2 97%  ? ?Visual Acuity ?Right Eye Distance:   ?Left Eye Distance:   ?Bilateral Distance:   ? ?Right Eye Near:   ?Left Eye Near:    ?Bilateral Near:    ? ?Physical Exam ?Constitutional:   ?   Appearance: Normal appearance. She is well-developed.  ?HENT:  ?   Head: Normocephalic.  ?Eyes:  ?   Extraocular Movements: Extraocular movements intact.  ?Pulmonary:  ?   Effort: Pulmonary effort is normal.  ?Abdominal:  ?   General: Abdomen is flat. Bowel sounds are increased. There is distension.  ?   Palpations: Abdomen is soft.  ?   Tenderness: There is abdominal tenderness in the epigastric area.  ?Skin: ?   General: Skin is warm and dry.  ?Neurological:  ?   Mental Status: She is alert and oriented to person, place, and time. Mental status is at baseline.  ?Psychiatric:     ?   Mood and Affect: Mood normal.     ?   Behavior: Behavior normal.  ? ? ? ?UC Treatments / Results   ?Labs ?(all labs ordered are listed, but only abnormal results are displayed) ?Labs Reviewed - No data to display ? ?EKG ? ? ?Radiology ?No results found. ? ?Procedures ?Procedures (including critical care time

## 2021-07-27 NOTE — Discharge Instructions (Addendum)
On exam there was increased bowel sounds over your intestines and your stomach, you are also experiencing mild tenderness over your stomach, I have a low suspicion of infectious cause and I do agree with you that symptoms are most likely related to gas ? ?Please continue taking your Protonix daily as this is protecting her stomach from ulcerations ? ?You have been given Maalox in office which is a mixture medicine to help reduce gas ? ?May use Zofran every 8 hours to help calm nausea, wait 32 minutes to an hour before attempting to eat after use ? ?Given prescribed Maalox for outpatient use, you may use medicine 4 times a day, ideally taking before meals at least 30 minutes prior ? ?If symptoms continue to persist please follow-up with your primary care doctor or gastrointestinal specialist for reevaluation and further management ? ?I have a low suspicion that your symptoms are being caused by your pancreas as presentation of inflammation or irritation usually shows severe left-sided abdominal pain, vomiting and inability to tolerate any food or liquids ?

## 2021-07-27 NOTE — ED Triage Notes (Signed)
Pt states she has intermittent abdominal pain and gas after drinking a strawberry shake 5 days. Pt denies diarrhea or urinary symptoms.  ?

## 2021-07-27 NOTE — Telephone Encounter (Signed)
Per Lenna Sciara she is going there for counseling, so no appt needed ?

## 2021-07-28 ENCOUNTER — Ambulatory Visit
Admission: RE | Admit: 2021-07-28 | Discharge: 2021-07-28 | Disposition: A | Discharge status: Home / Self Care | Payer: No Typology Code available for payment source | Source: Ambulatory Visit | Attending: Internal Medicine | Admitting: Internal Medicine

## 2021-07-28 DIAGNOSIS — Z1231 Encounter for screening mammogram for malignant neoplasm of breast: Secondary | ICD-10-CM | POA: Diagnosis not present

## 2021-07-29 ENCOUNTER — Ambulatory Visit: Payer: No Typology Code available for payment source | Admitting: Internal Medicine

## 2021-07-29 NOTE — Progress Notes (Deleted)
   Acute Office Visit  Subjective:     Patient ID: Donna Reyes, female    DOB: 15-Sep-1975, 46 y.o.   MRN: [id number]  No chief complaint on file.   HPI Patient is in today for stomach issues.   Abdominal Complaint:  -Duration: {Blank single:19197::"chronic","days","weeks","months"} -Frequency: {Blank single:19197::"constant","intermittent","occasional","rare","every few minutes","a few times a hour","a few times a day","a few times a week","a few times a month","a few times a year"} -Nature: {Blank multiple:19196::"bloating","sharp","dull","aching","burning","cramping","ill-defined","itchy","pressure-like","pulling","shooting","sore","stabbing","tender","tearing","throbbing"} -Location: {Blank multiple:19196::"diffuse","vague","LUQ","RUQ","epigastric","peri-umbilical","LLQ","RLQ","diffuse","suprapubic". "lower abdominal quadrants"}  -Severity: {Blank single:19197::"mild","moderate","severe","1/10","2/10","3/10","4/10","5/10","6/10","7/10","8/10","9/10","10/10"}  -Radiation: {Blank single:19197::"yes","no"} -Alleviating factors: *** -Aggravating factors: *** -Treatments attempted: {Blank multiple:19196::"none","antacids","PPI","H2 Blocker","laxatives"} -Constipation: {Blank single:19197::"yes","no","intermittent"} -Diarrhea: {Blank single:19197::"yes","no"} -Episodes of diarrhea/day: -Mucous in the stool: {Blank single:19197::"yes","no"} -Heartburn: {Blank single:19197::"yes","no"} -Bloating:{Blank single:19197::"yes","no"} -Passing Gas: {Blank single:19197::"yes","no"} -Nausea: {Blank single:19197::"yes","no"} -Vomiting: {Blank single:19197::"yes","no"} -Episodes of vomit/day: -Melena or hematochezia: {Blank single:19197::"yes","no"} -Rash: {Blank single:19197::"yes","no"} -Jaundice: {Blank single:19197::"yes","no"} -Fever: {Blank single:19197::"yes","no"} -Weight loss: {Blank single:19197::"yes","no"} -Change in Appetite: {Blank  single:19197::"yes","no"}   ROS      Objective:    LMP 04/21/2021  {Vitals History (Optional):23777}  Physical Exam  No results found for any visits on 07/29/21.      Assessment & Plan:   Problem List Items Addressed This Visit   None   No orders of the defined types were placed in this encounter.   No follow-ups on file.  Teodora Medici, DO

## 2021-08-06 ENCOUNTER — Ambulatory Visit: Payer: No Typology Code available for payment source | Admitting: Family Medicine

## 2021-08-07 ENCOUNTER — Other Ambulatory Visit: Payer: Self-pay | Admitting: Gastroenterology

## 2021-08-09 ENCOUNTER — Telehealth: Payer: Self-pay

## 2021-08-09 NOTE — Telephone Encounter (Signed)
Copied from [place] [phone]. Topic: General - Inquiry ?>> Aug 09, 2021  2:24 PM Oneta Rack wrote: ?Reason for CRM:  Patient would like her current immunization records, please send on My Chart. ?

## 2021-08-09 NOTE — Telephone Encounter (Signed)
Sent through mychart

## 2021-08-16 ENCOUNTER — Encounter: Payer: Self-pay | Admitting: Internal Medicine

## 2021-08-22 ENCOUNTER — Encounter: Payer: Self-pay | Admitting: Internal Medicine

## 2021-08-23 ENCOUNTER — Other Ambulatory Visit: Payer: Self-pay

## 2021-08-23 DIAGNOSIS — I10 Essential (primary) hypertension: Secondary | ICD-10-CM

## 2021-08-23 MED ORDER — LOSARTAN POTASSIUM-HCTZ 100-12.5 MG PO TABS: ORAL_TABLET | ORAL | 1 refills | Status: AC

## 2021-08-24 ENCOUNTER — Encounter: Payer: Self-pay | Admitting: Internal Medicine

## 2021-10-19 IMAGING — CR DG CHEST 2V
1 series · 2 of 2 positions shown · non-contrast
Comparison: January 06, 2016

CLINICAL DATA: Chest pain.

EXAM:
CHEST - 2 VIEW

[Series 1: dg chest 2 view · 0.14mm/px · 2 of 2 slices shown]
[im 1/2]
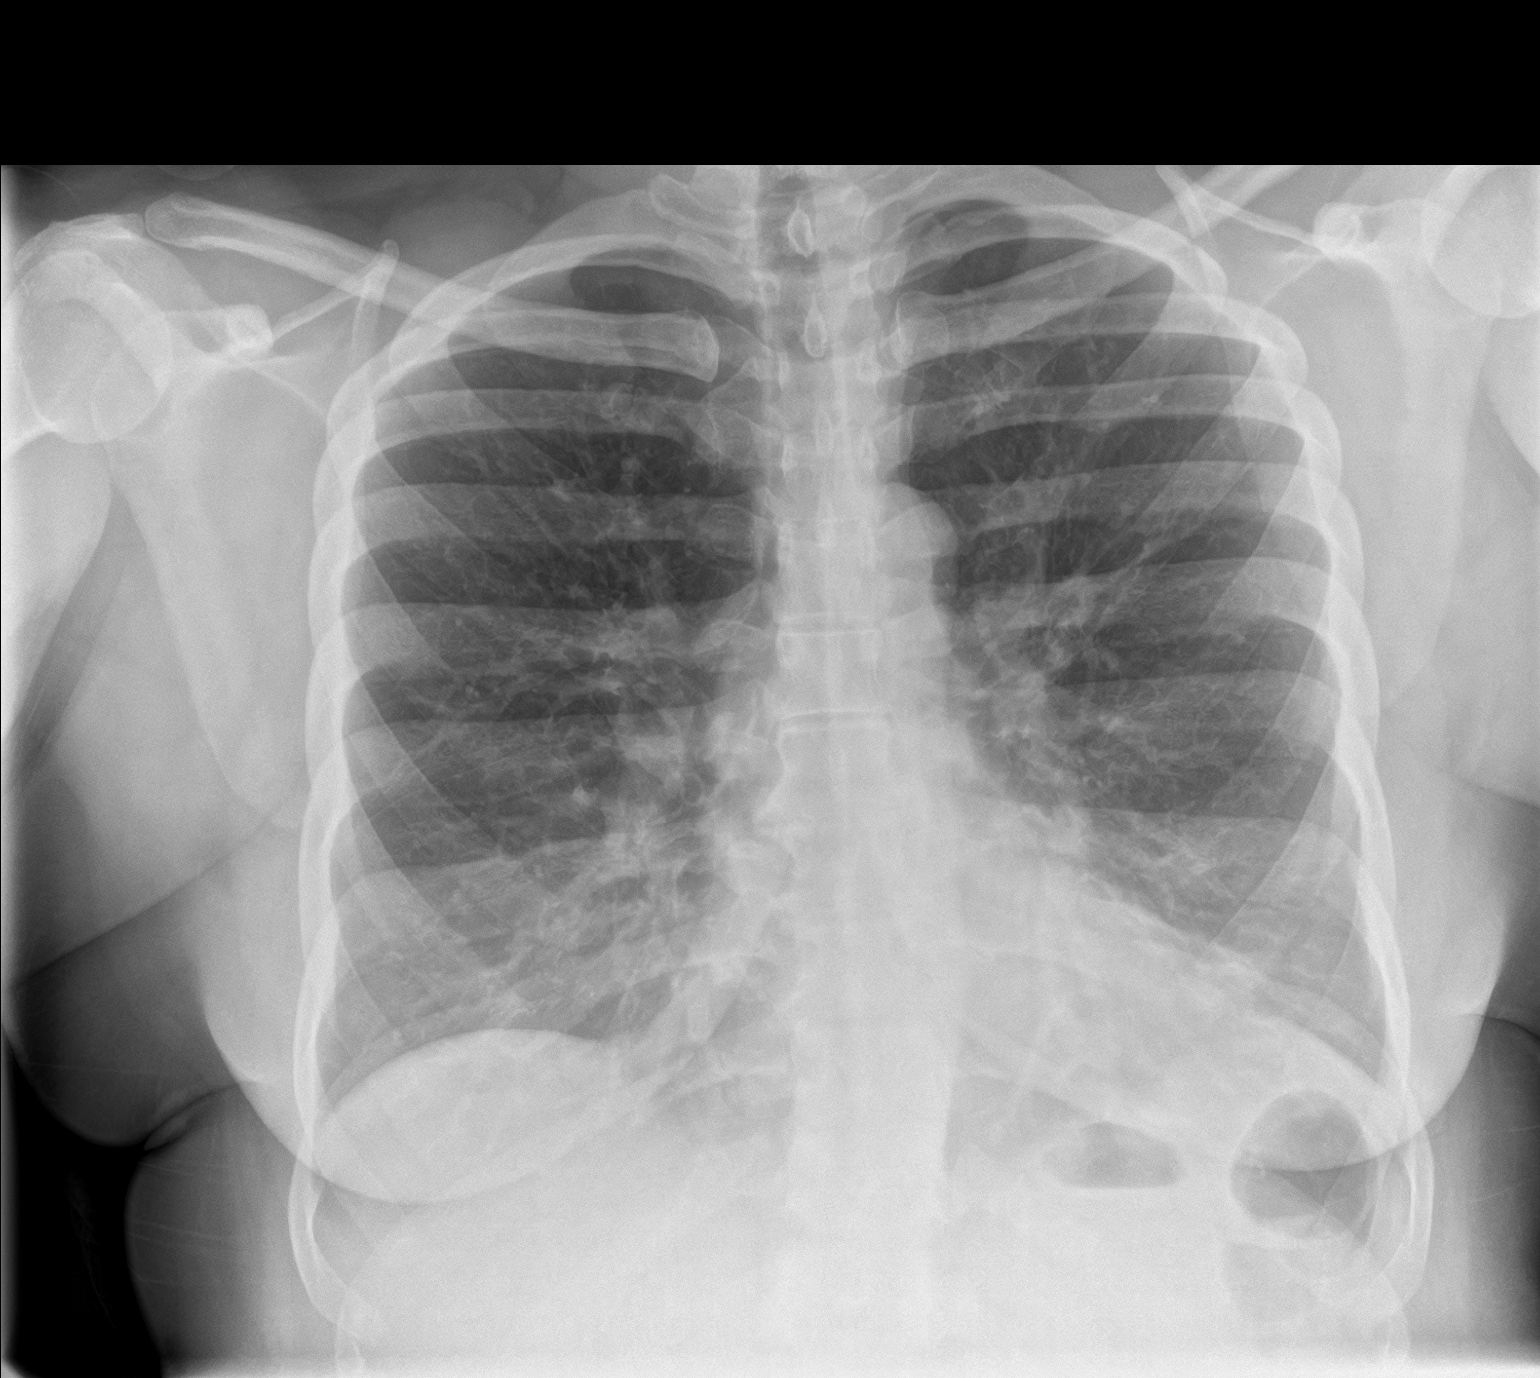
[im 2/2]
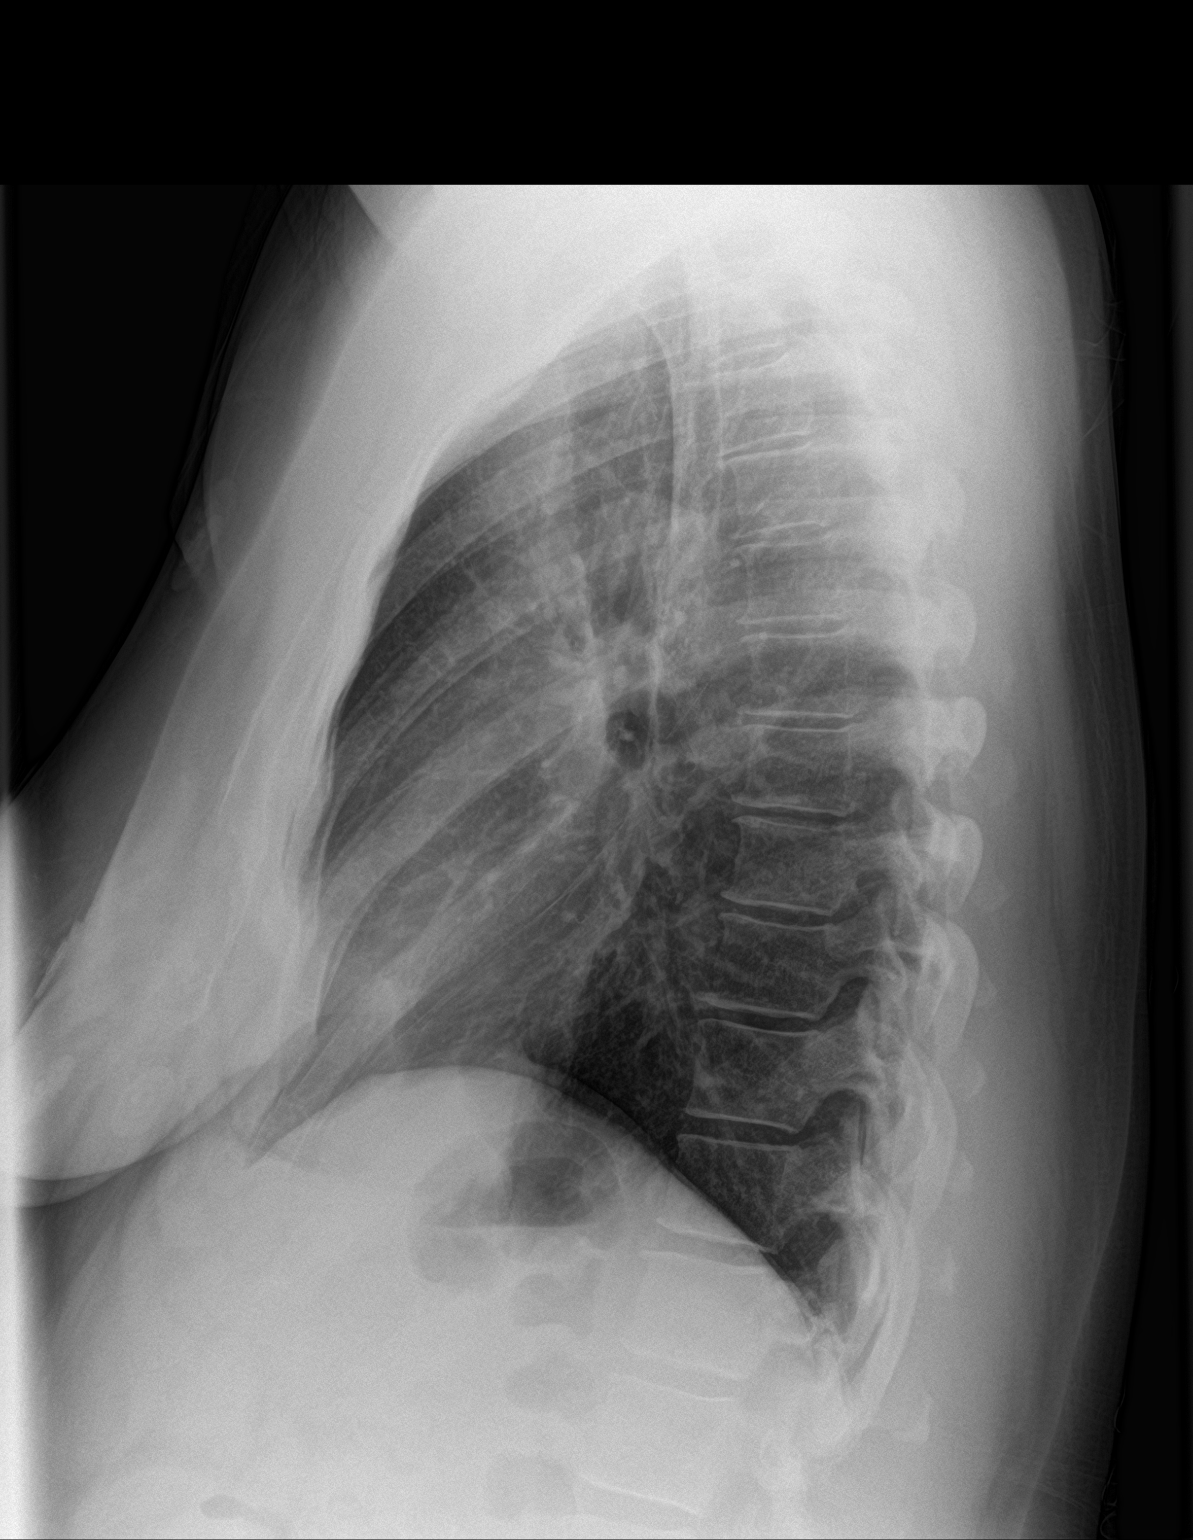

[2 of 2 positions shown; findings below may reference images not displayed]

FINDINGS: There is no evidence of acute infiltrate, pleural effusion or
pneumothorax. The heart size and mediastinal contours are within
normal limits. The visualized skeletal structures are unremarkable.
IMPRESSION: No active cardiopulmonary disease.

## 2021-11-18 ENCOUNTER — Ambulatory Visit: Payer: No Typology Code available for payment source | Admitting: Internal Medicine

## 2021-12-09 ENCOUNTER — Inpatient Hospital Stay: Payer: No Typology Code available for payment source | Attending: Internal Medicine

## 2021-12-10 MED FILL — Iron Sucrose Inj 20 MG/ML (Fe Equiv): INTRAVENOUS | Qty: 10 | Status: AC

## 2021-12-13 ENCOUNTER — Inpatient Hospital Stay: Payer: No Typology Code available for payment source

## 2021-12-13 ENCOUNTER — Inpatient Hospital Stay: Payer: No Typology Code available for payment source | Admitting: Internal Medicine

## 2021-12-13 NOTE — Progress Notes (Deleted)
[facility] OFFICE PROGRESS NOTE  Patient Care Team: Teodora Medici, DO as PCP - General (Internal Medicine) Bary Castilla, Forest Gleason, MD (General Surgery) Lequita Asal, MD (Inactive) as Referring Physician (Hematology and Oncology)   Cancer Staging  No matching staging information was found for the patient.   Oncology History   No history exists.    Latest Reference Range & Units 06/04/21 14:52  Iron 28 - 170 ug/dL 38  UIBC ug/dL 392  TIBC 250 - 450 ug/dL 430  Saturation Ratios 10.4 - 31.8 % 9 (L)  Ferritin 11 - 307 ng/mL 18  (L): Data is abnormally low   INTERVAL HISTORY:  Donna Reyes 46 y.o.  female pleasant patient above history of iron deficiency anemia likely secondary to chronic heavy menstrual losses is here for follow-up.  In the interim patient had TAH.  Unfortunately; had ileus postoperatively.   Energy levels are adequate.  Denies any nausea vomiting abdominal pain or blood in stools black or stools.    Review of Systems  Constitutional:  Negative for chills, diaphoresis, fever, malaise/fatigue and weight loss.  HENT:  Negative for nosebleeds and sore throat.   Eyes:  Negative for double vision.  Respiratory:  Negative for cough, hemoptysis, sputum production, shortness of breath and wheezing.   Cardiovascular:  Negative for chest pain, palpitations, orthopnea and leg swelling.  Gastrointestinal:  Negative for abdominal pain, blood in stool, constipation, diarrhea, heartburn, melena, nausea and vomiting.  Genitourinary:  Negative for dysuria, frequency and urgency.  Musculoskeletal:  Negative for back pain and joint pain.  Skin: Negative.  Negative for itching and rash.  Neurological:  Negative for dizziness, tingling, focal weakness, weakness and headaches.  Endo/Heme/Allergies:  Does not bruise/bleed easily.  Psychiatric/Behavioral:  Negative for depression. The patient is not nervous/anxious and does not have insomnia.       PAST MEDICAL HISTORY :  Past Medical History:  Diagnosis Date  . Allergy   . Anemia   . Anxiety 03/05/2019  . Asthma   . BRCA negative 10/2019   MyRisk neg; IBIS=7.8%  . Diabetes mellitus without complication ([place])    type 2  . Family history of adverse reaction to anesthesia    Father - slow to wake  . Family history of pancreatic cancer   . GERD (gastroesophageal reflux disease)   . Hypertension   . Ileus following gastrointestinal surgery ([place])   . Menorrhagia with regular cycle 08/14/2018  . Sleep apnea   . Small bowel obstruction ([place]) 05/06/2021  . Wears contact lenses     PAST SURGICAL HISTORY :   Past Surgical History:  Procedure Laterality Date  . ABDOMINAL HYSTERECTOMY    . AXILLARY LYMPH NODE BIOPSY Right 05/25/2017   LYMPHADENITIS. NEGATIVE FOR MALIGNANCY.   Marland Kitchen BREAST BIOPSY Right 05/25/2017   neg/NEGATIVE FOR CARCINOMA. INFLAMMATION AND SQUAMOUS METAPLASIA OF A FOCAL DUCT   . CESAREAN SECTION     2002,2006,2007  . COLONOSCOPY WITH PROPOFOL N/A 06/23/2020   Procedure: COLONOSCOPY WITH PROPOFOL;  Surgeon: Virgel Manifold, MD;  Location: [facility];  Service: Endoscopy;  Laterality: N/A;  Diabetic  sleep apnea priority 4  . CYSTOSCOPY N/A 05/04/2021   Procedure: CYSTOSCOPY;  Surgeon: Gae Dry, MD;  Location: [facility];  Service: Gynecology;  Laterality: N/A;  . TOTAL LAPAROSCOPIC HYSTERECTOMY WITH SALPINGECTOMY Bilateral 05/04/2021   Procedure: TOTAL LAPAROSCOPIC HYSTERECTOMY WITH BILATERAL SALPINGECTOMY;  Surgeon: Gae Dry, MD;  Location: [facility];  Service: Gynecology;  Laterality: Bilateral;  . TUBAL LIGATION  2007    FAMILY HISTORY :   Family History  Problem Relation Age of Onset  . Diabetes Mother   . Heart failure Mother   . Kidney disease Mother   . Diabetes Maternal Grandmother   . Breast cancer Maternal Grandmother 64  . Hypertension Father   . Hyperlipidemia Father   . Atrial fibrillation Sister   . ADD / ADHD  Son   . Heart attack Maternal Grandfather   . Diabetes Paternal Grandmother   . Hypertension Paternal Grandmother   . Prostate cancer Paternal Grandfather   . Colon cancer Paternal Grandfather 34  . Polycystic ovary syndrome Sister   . Polycystic ovary syndrome Sister   . Pancreatic cancer Paternal Aunt 57  . Ovarian cancer Neg Hx     SOCIAL HISTORY:   Social History   Tobacco Use  . Smoking status: Never  . Smokeless tobacco: Never  Vaping Use  . Vaping Use: Never used  Substance Use Topics  . Alcohol use: No  . Drug use: No    ALLERGIES:  is allergic to capsicum, basil oil, and lisinopril.  MEDICATIONS:  Current Outpatient Medications  Medication Sig Dispense Refill  . albuterol (VENTOLIN HFA) 108 (90 Base) MCG/ACT inhaler Inhale 2 puffs into the lungs every 6 (six) hours as needed for wheezing or shortness of breath. May also use 2 puffs prior to exercise for EIB 8 g 5  . aluminum-magnesium hydroxide-simethicone (MAALOX) 200-200-20 MG/5ML SUSP Take 30 mLs by mouth 4 (four) times daily -  before meals and at bedtime. 1680 mL 0  . amLODipine (NORVASC) 5 MG tablet Take 5 mg by mouth daily.    . Cholecalciferol (VITAMIN D3) 125 MCG (5000 UT) CAPS Take 5,000 Units by mouth daily.    Marland Kitchen losartan-hydrochlorothiazide (HYZAAR) 100-12.5 MG tablet TAKE 1 TABLET BY MOUTH EVERY DAY (STOP LOSARTAN) 90 tablet 1  . metFORMIN (GLUCOPHAGE) 500 MG tablet Take 1 tablet (500 mg total) by mouth 2 (two) times daily with a meal. 180 tablet 3  . nystatin (MYCOSTATIN/NYSTOP) powder Apply 1 application. topically 3 (three) times daily. 15 g 0  . ondansetron (ZOFRAN-ODT) 4 MG disintegrating tablet Take 1 tablet (4 mg total) by mouth every 8 (eight) hours as needed for nausea or vomiting. 20 tablet 0  . pantoprazole (PROTONIX) 40 MG tablet Take 1 tablet (40 mg total) by mouth daily before breakfast. 90 tablet 1  . rosuvastatin (CRESTOR) 10 MG tablet Take 1 tablet (10 mg total) by mouth daily. 90 tablet 3   . SYMBICORT 160-4.5 MCG/ACT inhaler Inhale 2 puffs into the lungs in the morning and at bedtime. (Patient taking differently: Inhale 2 puffs into the lungs daily.) 10.2 g 11   No current facility-administered medications for this visit.    PHYSICAL EXAMINATION:  LMP 04/21/2021   There were no vitals filed for this visit.   Physical Exam Vitals and nursing note reviewed.  HENT:     Head: Normocephalic and atraumatic.     Mouth/Throat:     Pharynx: Oropharynx is clear.  Eyes:     Extraocular Movements: Extraocular movements intact.     Pupils: Pupils are equal, round, and reactive to light.  Cardiovascular:     Rate and Rhythm: Normal rate and regular rhythm.  Pulmonary:     Comments: Decreased breath sounds bilaterally.  Abdominal:     Palpations: Abdomen is soft.  Musculoskeletal:        General: Normal  range of motion.     Cervical back: Normal range of motion.  Skin:    General: Skin is warm.  Neurological:     General: No focal deficit present.     Mental Status: She is alert and oriented to person, place, and time.  Psychiatric:        Behavior: Behavior normal.        Judgment: Judgment normal.      LABORATORY DATA:  I have reviewed the data as listed    Component Value Date/Time   NA 140 05/10/2021 1045   NA 140 05/06/2014 0404   K 3.6 05/10/2021 1045   K 4.0 05/06/2014 0404   CL 104 05/10/2021 1045   CL 106 05/06/2014 0404   CO2 29 05/10/2021 1045   CO2 25 05/06/2014 0404   GLUCOSE 133 (H) 05/10/2021 1045   GLUCOSE 107 (H) 05/06/2014 0404   BUN 21 (H) 05/10/2021 1045   BUN 13 05/06/2014 0404   CREATININE 0.63 05/10/2021 1045   CREATININE 0.78 02/08/2021 0922   CALCIUM 8.0 (L) 05/10/2021 1045   CALCIUM 8.8 05/06/2014 0404   PROT 6.5 05/10/2021 1045   PROT 7.1 05/06/2014 0404   ALBUMIN 2.8 (L) 05/10/2021 1045   ALBUMIN 3.2 (L) 05/06/2014 0404   AST 24 05/10/2021 1045   AST 13 (L) 05/06/2014 0404   ALT 21 05/10/2021 1045   ALT 19 05/06/2014  0404   ALKPHOS 55 05/10/2021 1045   ALKPHOS 60 05/06/2014 0404   BILITOT 0.7 05/10/2021 1045   BILITOT 0.2 05/06/2014 0404   GFRNONAA >60 05/10/2021 1045   GFRNONAA 105 07/10/2020 0829   GFRAA 122 07/10/2020 0829    No results found for: "SPEP", "UPEP"  Lab Results  Component Value Date   WBC 4.8 06/04/2021   NEUTROABS 2.0 06/04/2021   HGB 11.7 (L) 06/04/2021   HCT 36.6 06/04/2021   MCV 83.0 06/04/2021   PLT 360 06/04/2021      Chemistry      Component Value Date/Time   NA 140 05/10/2021 1045   NA 140 05/06/2014 0404   K 3.6 05/10/2021 1045   K 4.0 05/06/2014 0404   CL 104 05/10/2021 1045   CL 106 05/06/2014 0404   CO2 29 05/10/2021 1045   CO2 25 05/06/2014 0404   BUN 21 (H) 05/10/2021 1045   BUN 13 05/06/2014 0404   CREATININE 0.63 05/10/2021 1045   CREATININE 0.78 02/08/2021 0922      Component Value Date/Time   CALCIUM 8.0 (L) 05/10/2021 1045   CALCIUM 8.8 05/06/2014 0404   ALKPHOS 55 05/10/2021 1045   ALKPHOS 60 05/06/2014 0404   AST 24 05/10/2021 1045   AST 13 (L) 05/06/2014 0404   ALT 21 05/10/2021 1045   ALT 19 05/06/2014 0404   BILITOT 0.7 05/10/2021 1045   BILITOT 0.2 05/06/2014 0404       RADIOGRAPHIC STUDIES: I have personally reviewed the radiological images as listed and agreed with the findings in the report. No results found.   ASSESSMENT & PLAN:  Iron deficiency #  Iron deficiency anemia: Secondary history of heavy menstrual blood loss.  Hb11; ferritin 20.  Hold Venofer. [not on PO iron sec to stomach ulcer]-since patient had TAH.  #Etiology: Menorrhagia-s/p TAH.  # DISPOSITION: # NO venofer # 6 months for MD assessment, labs (CBC with diff, iron studies/ferritin- day before) and +/- Venofer- Dr.B.     No orders of the defined types were placed in this encounter.  All questions were answered. The patient knows to call the clinic with any problems, questions or concerns.      Cammie Sickle, MD 12/13/2021 8:09 AM

## 2021-12-13 NOTE — Assessment & Plan Note (Deleted)
#   Iron deficiency anemia: Secondary history of heavy menstrual blood loss.  Hb11; ferritin 20.  Hold Venofer. [not on PO iron sec to stomach ulcer]-since patient had TAH.  #Etiology: Menorrhagia-s/p TAH.  # DISPOSITION: # NO venofer # 6 months for MD assessment, labs (CBC with diff, iron studies/ferritin- day before) and +/- Venofer- Dr.B.

## 2022-01-26 ENCOUNTER — Other Ambulatory Visit: Payer: Self-pay | Admitting: Gastroenterology

## 2022-08-07 IMAGING — US US PELVIS COMPLETE WITH TRANSVAGINAL
1 series · 13 of 25 positions shown · non-contrast
Comparison: 10/18/2019

CLINICAL DATA: Pelvic pain for 5 days, abnormal uterine bleeding,
history of leiomyoma. Unknown LMP. History hypertension, diabetes
mellitus

EXAM:
TRANSABDOMINAL AND TRANSVAGINAL ULTRASOUND OF PELVIS
TECHNIQUE: Both transabdominal and transvaginal ultrasound examinations of the
pelvis were performed. Transabdominal technique was performed for
global imaging of the pelvis including uterus, ovaries, adnexal
regions, and pelvic cul-de-sac. It was necessary to proceed with
endovaginal exam following the transabdominal exam to visualize the
endometrium and ovaries.

[Series 1: us pelvis complete with transvaginal · 0.23mm/px · 13 of 116 slices shown]
[im 1/116]
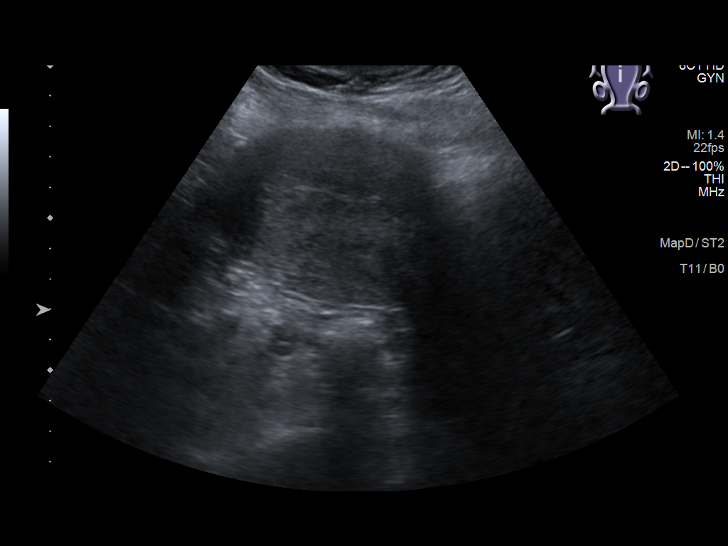
[im 10/116]
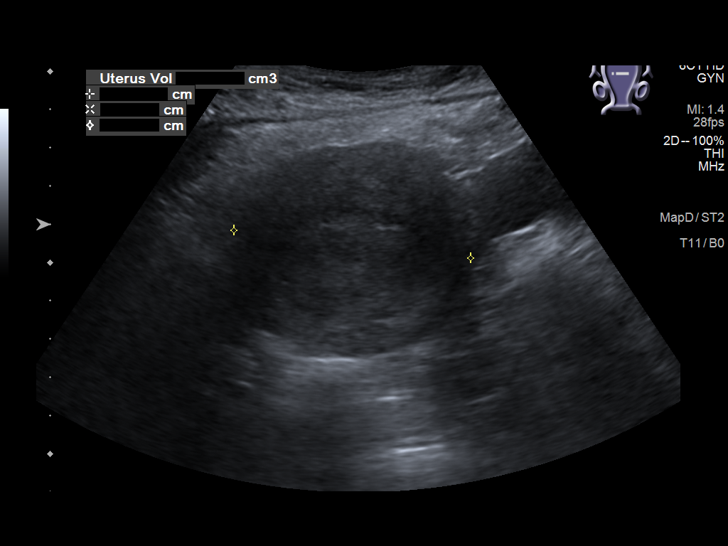
[im 20/116]
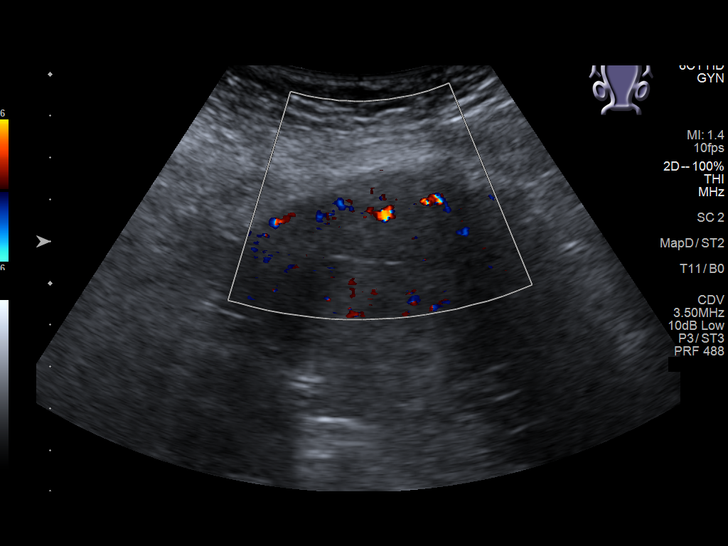
[im 29/116]
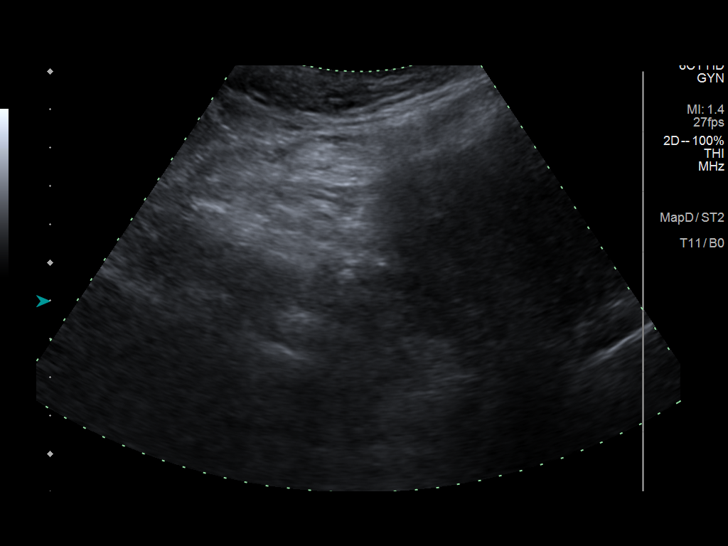
[im 39/116]
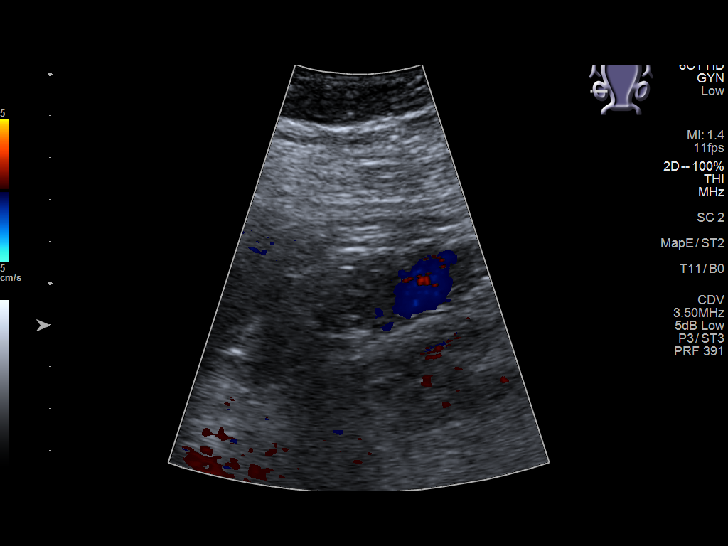
[im 48/116]
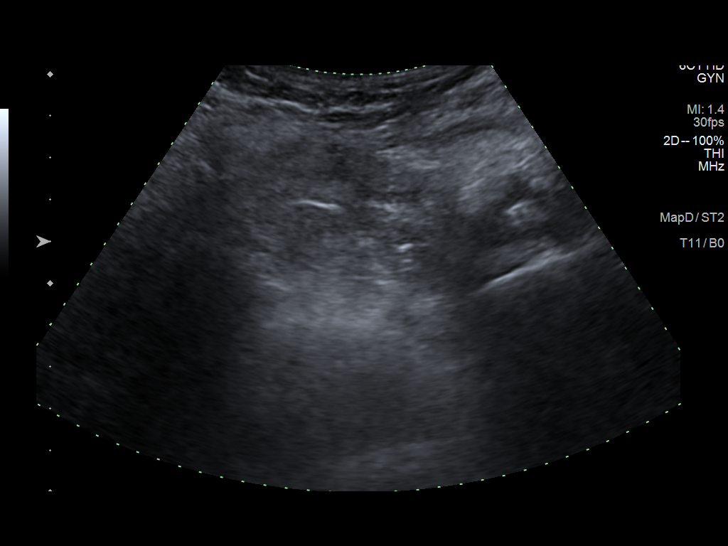
[im 58/116]
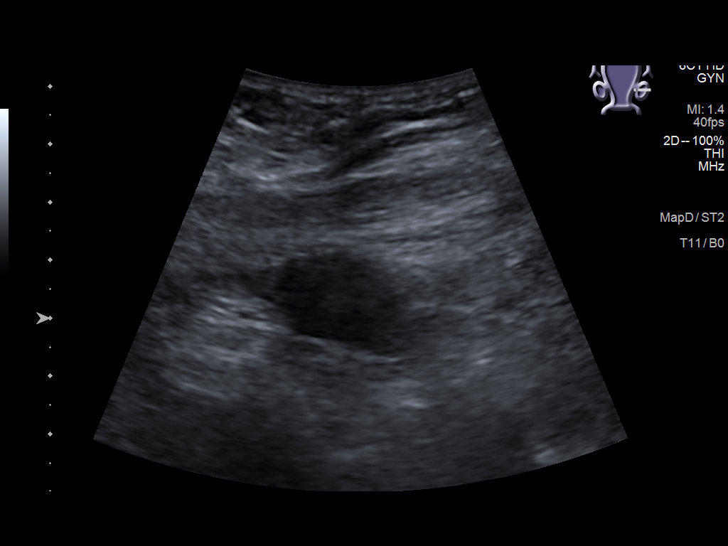
[im 68/116]
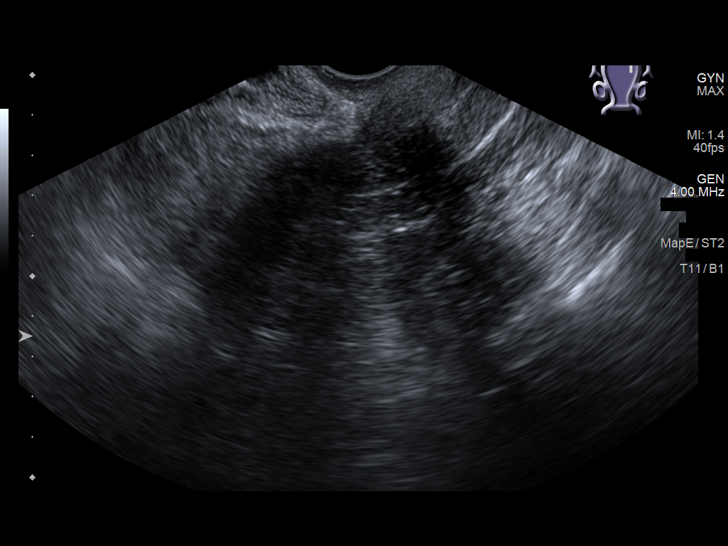
[im 77/116]
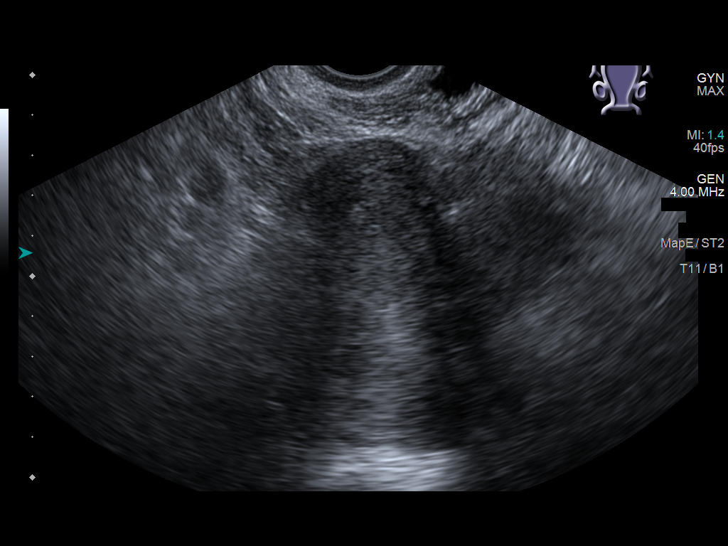
[im 87/116]
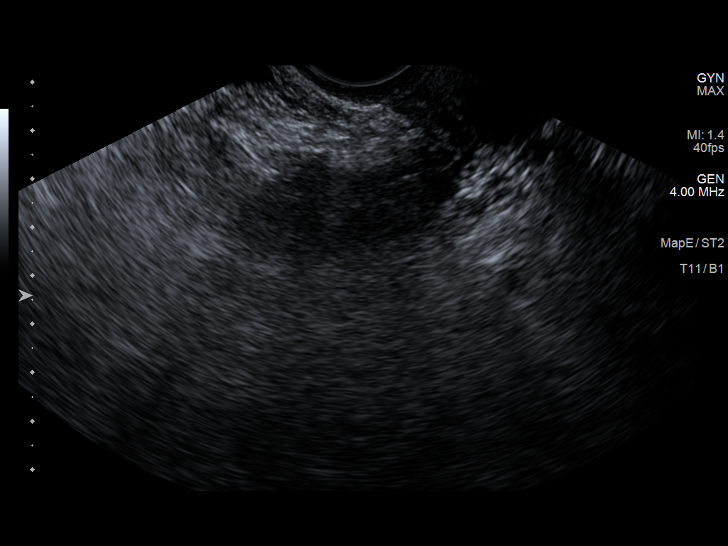
[im 96/116]
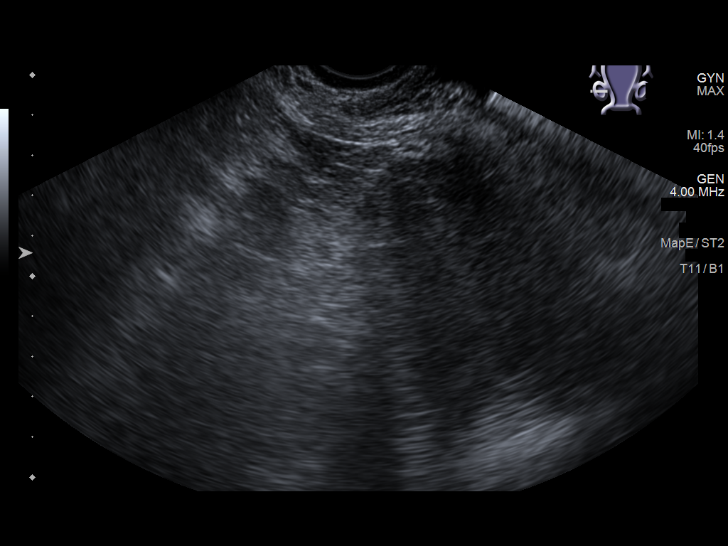
[im 106/116]
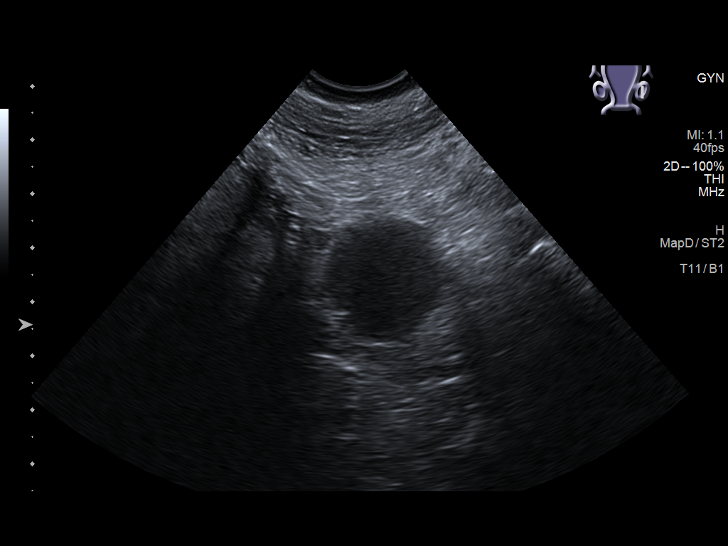
[im 116/116]
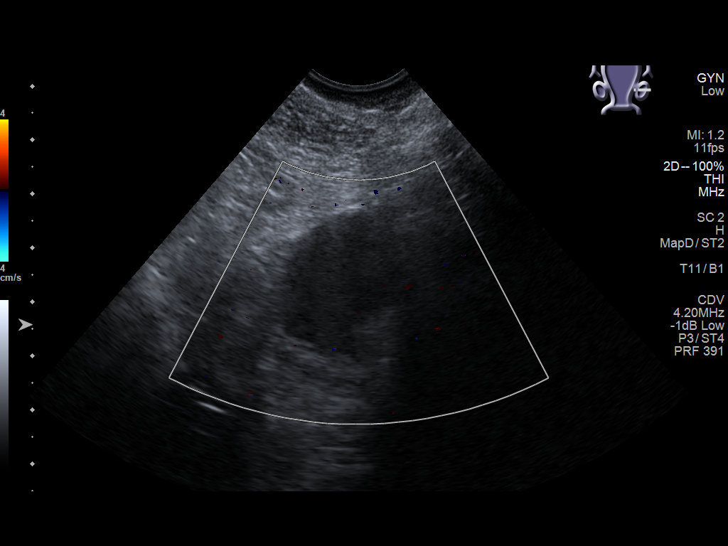

[13 of 25 positions shown; findings below may reference images not displayed]

FINDINGS: Uterus

Measurements: 12.6 x 6.2 x 6.7 cm = volume: 276 mL. Anteverted and
slightly retroflexed. Heterogeneous myometrium. Questionable
leiomyoma anteriorly at upper uterus, transmural, 2.9 x 2.3 x
cm. No definite additional mass is seen.

Endometrium

Thickness: 9 mm.  No endometrial fluid or mass

Right ovary

Measurements: 3.7 x 2.1 x 1.9 cm = volume: 7.6 mL. Normal morphology
without mass

Left ovary

Measurements: 3.8 x 2.7 x 2.4 cm = volume: 13.2 mL. Normal
morphology without mass

Other findings

No free pelvic fluid.  No adnexal masses.
IMPRESSION: Questionable transmural leiomyoma at anterior upper uterus 2.9 cm
diameter.

Remainder of exam grossly unremarkable.
